# Patient Record
Sex: Male | Born: 1985 | Race: White | Hispanic: No | Marital: Married
Health system: Midwestern US, Community
[De-identification: ages and names within clinical notes are randomized; demographics above are authoritative.]

## PROBLEM LIST (undated history)

## (undated) DIAGNOSIS — M25559 Pain in unspecified hip: Secondary | ICD-10-CM

## (undated) DIAGNOSIS — A4902 Methicillin resistant Staphylococcus aureus infection, unspecified site: Secondary | ICD-10-CM

## (undated) DIAGNOSIS — W108XXA Fall (on) (from) other stairs and steps, initial encounter: Secondary | ICD-10-CM

## (undated) DIAGNOSIS — M25551 Pain in right hip: Secondary | ICD-10-CM

## (undated) DIAGNOSIS — H9209 Otalgia, unspecified ear: Secondary | ICD-10-CM

## (undated) DIAGNOSIS — M545 Low back pain, unspecified: Secondary | ICD-10-CM

## (undated) DIAGNOSIS — S72001A Fracture of unspecified part of neck of right femur, initial encounter for closed fracture: Secondary | ICD-10-CM

## (undated) DIAGNOSIS — G809 Cerebral palsy, unspecified: Secondary | ICD-10-CM

## (undated) DIAGNOSIS — G35 Multiple sclerosis: Secondary | ICD-10-CM

## (undated) DIAGNOSIS — G8220 Paraplegia, unspecified: Principal | ICD-10-CM

## (undated) DIAGNOSIS — Z765 Malingerer [conscious simulation]: Secondary | ICD-10-CM

## (undated) HISTORY — PX: BACK SURGERY: SHX140

## (undated) HISTORY — PX: LEG SURGERY: SHX1003

---
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
## 2014-03-26 ENCOUNTER — Encounter (HOSPITAL_COMMUNITY): Payer: Self-pay | Admitting: Emergency Medicine

## 2014-03-26 ENCOUNTER — Emergency Department (HOSPITAL_COMMUNITY): Payer: Medicare Other

## 2014-03-26 ENCOUNTER — Emergency Department (HOSPITAL_COMMUNITY)
Admission: EM | Admit: 2014-03-26 | Discharge: 2014-03-26 | Disposition: A | Discharge status: Home / Self Care | Payer: Medicare Other | Attending: Emergency Medicine | Admitting: Emergency Medicine

## 2014-03-26 DIAGNOSIS — Y998 Other external cause status: Secondary | ICD-10-CM | POA: Insufficient documentation

## 2014-03-26 DIAGNOSIS — Y9389 Activity, other specified: Secondary | ICD-10-CM | POA: Diagnosis not present

## 2014-03-26 DIAGNOSIS — Y9289 Other specified places as the place of occurrence of the external cause: Secondary | ICD-10-CM | POA: Diagnosis not present

## 2014-03-26 DIAGNOSIS — W19XXXA Unspecified fall, initial encounter: Secondary | ICD-10-CM

## 2014-03-26 DIAGNOSIS — S7001XA Contusion of right hip, initial encounter: Secondary | ICD-10-CM | POA: Insufficient documentation

## 2014-03-26 DIAGNOSIS — Z720 Tobacco use: Secondary | ICD-10-CM | POA: Insufficient documentation

## 2014-03-26 DIAGNOSIS — S79911A Unspecified injury of right hip, initial encounter: Secondary | ICD-10-CM | POA: Diagnosis present

## 2014-03-26 DIAGNOSIS — Z8669 Personal history of other diseases of the nervous system and sense organs: Secondary | ICD-10-CM | POA: Diagnosis not present

## 2014-03-26 DIAGNOSIS — W182XXA Fall in (into) shower or empty bathtub, initial encounter: Secondary | ICD-10-CM | POA: Insufficient documentation

## 2014-03-26 HISTORY — DX: Cerebral palsy, unspecified: G80.9

## 2014-03-26 HISTORY — DX: Multiple sclerosis: G35

## 2014-03-26 MED ORDER — ONDANSETRON HCL 4 MG PO TABS
4.0000 mg | ORAL_TABLET | Freq: Once | ORAL | Status: AC
  Administered 2014-03-26: 4 mg via ORAL
  Filled 2014-03-26: qty 1

## 2014-03-26 MED ORDER — HYDROCODONE-ACETAMINOPHEN 5-325 MG PO TABS: 1.0000 | ORAL_TABLET | ORAL | Status: DC | PRN

## 2014-03-26 MED ORDER — KETOROLAC TROMETHAMINE 10 MG PO TABS
10.0000 mg | ORAL_TABLET | Freq: Once | ORAL | Status: DC
  Filled 2014-03-26: qty 1

## 2014-03-26 MED ORDER — HYDROCODONE-ACETAMINOPHEN 5-325 MG PO TABS
2.0000 | ORAL_TABLET | Freq: Once | ORAL | Status: AC
  Administered 2014-03-26: 2 via ORAL
  Filled 2014-03-26: qty 2

## 2014-03-26 NOTE — ED Notes (Signed)
Pt seen by PCP yesterday, sent to ED. Pt fell in the shower 2 days ago injuring the R hip. Pt has hx of CP and MS.

## 2014-03-26 NOTE — ED Notes (Signed)
Called back for fast track, no answer.

## 2014-03-26 NOTE — ED Provider Notes (Signed)
CSN: [id number]     Arrival date & time 03/26/14  1137 History   First MD Initiated Contact with Patient 03/26/14 1351     Chief Complaint  Patient presents with  . Fall     (Consider location/radiation/quality/duration/timing/severity/associated sxs/prior Treatment) Patient is a 29 y.o. male presenting with fall. The history is provided by the patient.  Fall This is a new problem. The current episode started in the past 7 days. The problem occurs intermittently. The problem has been gradually worsening. Associated symptoms include arthralgias and weakness. Pertinent negatives include no abdominal pain, chest pain, coughing or neck pain. The symptoms are aggravated by standing. He has tried NSAIDs for the symptoms. The treatment provided moderate relief.    Past Medical History  Diagnosis Date  . MS (multiple sclerosis)   . CP (cerebral palsy)    Past Surgical History  Procedure Laterality Date  . Back surgery    . Leg surgery     Family History  Problem Relation Age of Onset  . Schizophrenia Mother   . Cirrhosis Father   . Heart failure Father   . Diabetes Father   . Diabetes Other    History  Substance Use Topics  . Smoking status: Current Every Day Smoker -- 0.50 packs/day    Types: Cigarettes  . Smokeless tobacco: Never Used  . Alcohol Use: No    Review of Systems  Constitutional: Negative for activity change.       All ROS Neg except as noted in HPI  HENT: Negative.   Eyes: Negative for photophobia and discharge.  Respiratory: Negative for cough, shortness of breath and wheezing.   Cardiovascular: Negative for chest pain and palpitations.  Gastrointestinal: Negative for abdominal pain and blood in stool.  Genitourinary: Negative for dysuria, frequency and hematuria.  Musculoskeletal: Positive for arthralgias. Negative for back pain and neck pain.  Skin: Negative.   Neurological: Positive for weakness. Negative for dizziness, seizures and speech difficulty.   Psychiatric/Behavioral: Negative for hallucinations and confusion.      Allergies  Codeine and Tramadol  Home Medications   Prior to Admission medications   Not on File   BP 109/69 mmHg  Pulse 79  Temp(Src) 98.4 F (36.9 C) (Oral)  Resp 16  Ht 5\' 5"  (1.651 m)  Wt 105 lb 9.6 oz (47.9 kg)  BMI 17.57 kg/m2  SpO2 97% Physical Exam  Constitutional: He is oriented to person, place, and time. He appears well-developed and well-nourished.  Non-toxic appearance.  HENT:  Head: Normocephalic.  Right Ear: Tympanic membrane and external ear normal.  Left Ear: Tympanic membrane and external ear normal.  Eyes: EOM and lids are normal. Pupils are equal, round, and reactive to light.  Neck: Normal range of motion. Neck supple. Carotid bruit is not present.  Cardiovascular: Normal rate, regular rhythm, normal heart sounds, intact distal pulses and normal pulses.   Pulmonary/Chest: Breath sounds normal. No respiratory distress.  Abdominal: Soft. Bowel sounds are normal. There is no tenderness. There is no guarding.  Musculoskeletal: Normal range of motion.  Pain to palpation of the right hip. No deformity noted. No palpable hematoma.  Lymphadenopathy:       Head (right side): No submandibular adenopathy present.       Head (left side): No submandibular adenopathy present.    He has no cervical adenopathy.  Neurological: He is alert and oriented to person, place, and time. He has normal strength. No cranial nerve deficit or sensory deficit.  Skin: Skin  is warm and dry.  Psychiatric: He has a normal mood and affect. His speech is normal.  Nursing note and vitals reviewed.   ED Course  Procedures (including critical care time) Labs Review Labs Reviewed - No data to display  Imaging Review Dg Hip Unilat With Pelvis 2-3 Views Right  03/26/2014   CLINICAL DATA:  29 year old male slipped and fell getting out of the bath tub 2 days ago. Right hip pain and decreased sensation in the right  lower extremity. Initial encounter. Current history of cerebral palsy.  EXAM: RIGHT HIP (WITH PELVIS) 2-3 VIEWS  COMPARISON:  None.  FINDINGS: Femoral heads normally located. Hip joint spaces appear preserved. Pelvis intact. sacral ala and SI joints within normal limits. Mildly dysplastic appearance of the proximal right femur which appears to remain intact. No acute fracture identified.  IMPRESSION: No acute fracture or dislocation identified about the right hip or pelvis.   Electronically Signed   By: Odessa Fleming M.D.   On: 03/26/2014 12:59     EKG Interpretation None      MDM  No acute fracture or dislocation noted. Vital signs non-acute.  Pt encouraged to use ibuprofen and Ice pack. Rx for norco given to the patient.   Final diagnoses:  Fall    *I have reviewed nursing notes, vital signs, and all appropriate lab and imaging results for this patient.[address], PA-C 03/26/14 1404  Benjiman Core, MD 03/26/14 [phone]

## 2014-03-26 NOTE — Discharge Instructions (Signed)
Your xray is negative for fracture or dislocation. Please apply ice today, and use ibuprofen for discomfort. Use Norco for more severe pain. Please see your primary MD for recheck and additional management. Contusion A contusion is a deep bruise. Contusions happen when an injury causes bleeding under the skin. Signs of bruising include pain, puffiness (swelling), and discolored skin. The contusion may turn blue, purple, or yellow. HOME CARE   Put ice on the injured area.  Put ice in a plastic bag.  Place a towel between your skin and the bag.  Leave the ice on for 15-20 minutes, 03-04 times a day.  Only take medicine as told by your doctor.  Rest the injured area.  If possible, raise (elevate) the injured area to lessen puffiness. GET HELP RIGHT AWAY IF:   You have more bruising or puffiness.  You have pain that is getting worse.  Your puffiness or pain is not helped by medicine. MAKE SURE YOU:   Understand these instructions.  Will watch your condition.  Will get help right away if you are not doing well or get worse. Document Released: 06/23/2007 Document Revised: 03/29/2011 Document Reviewed: 11/09/2010 [facility] Patient Information 2015 [place], [place]. This information is not intended to replace advice given to you by your health care provider. Make sure you discuss any questions you have with your health care provider.

## 2014-04-03 ENCOUNTER — Emergency Department (HOSPITAL_COMMUNITY)
Admission: EM | Admit: 2014-04-03 | Discharge: 2014-04-03 | Disposition: A | Discharge status: Home / Self Care | Payer: Medicare Other | Attending: Emergency Medicine | Admitting: Emergency Medicine

## 2014-04-03 ENCOUNTER — Encounter (HOSPITAL_COMMUNITY): Payer: Self-pay | Admitting: *Deleted

## 2014-04-03 ENCOUNTER — Emergency Department (HOSPITAL_COMMUNITY): Payer: Medicare Other

## 2014-04-03 DIAGNOSIS — Y998 Other external cause status: Secondary | ICD-10-CM | POA: Insufficient documentation

## 2014-04-03 DIAGNOSIS — Z880 Allergy status to penicillin: Secondary | ICD-10-CM | POA: Insufficient documentation

## 2014-04-03 DIAGNOSIS — Y92129 Unspecified place in nursing home as the place of occurrence of the external cause: Secondary | ICD-10-CM | POA: Diagnosis not present

## 2014-04-03 DIAGNOSIS — Y9389 Activity, other specified: Secondary | ICD-10-CM | POA: Diagnosis not present

## 2014-04-03 DIAGNOSIS — W06XXXA Fall from bed, initial encounter: Secondary | ICD-10-CM | POA: Diagnosis not present

## 2014-04-03 DIAGNOSIS — Z8669 Personal history of other diseases of the nervous system and sense organs: Secondary | ICD-10-CM | POA: Diagnosis not present

## 2014-04-03 DIAGNOSIS — Z720 Tobacco use: Secondary | ICD-10-CM | POA: Diagnosis not present

## 2014-04-03 DIAGNOSIS — S7001XA Contusion of right hip, initial encounter: Secondary | ICD-10-CM | POA: Insufficient documentation

## 2014-04-03 DIAGNOSIS — W19XXXA Unspecified fall, initial encounter: Secondary | ICD-10-CM

## 2014-04-03 DIAGNOSIS — S79911A Unspecified injury of right hip, initial encounter: Secondary | ICD-10-CM | POA: Diagnosis present

## 2014-04-03 MED ORDER — HYDROCODONE-ACETAMINOPHEN 5-325 MG PO TABS
1.0000 | ORAL_TABLET | Freq: Once | ORAL | Status: AC
  Administered 2014-04-03: 1 via ORAL
  Filled 2014-04-03: qty 1

## 2014-04-03 MED ORDER — ACETAMINOPHEN 500 MG PO TABS
1000.0000 mg | ORAL_TABLET | Freq: Once | ORAL | Status: AC
  Administered 2014-04-03: 1000 mg via ORAL
  Filled 2014-04-03: qty 2

## 2014-04-03 NOTE — ED Provider Notes (Signed)
CSN: [id number]     Arrival date & time 04/03/14  1238 History  This chart was scribed for Donnetta Hutching, MD by Luisa Dago, Medical Scribe. This patient was seen in room APA07/APA07 and the patient's care was started at 1:23 PM.    Chief Complaint  Patient presents with  . Fall   The history is provided by the patient. No language interpreter was used.   HPI Comments: Elijah Palmer is a 29 y.o. male with PMhx of Multiple sclerosis and cerebral palsy presents to the Emergency Department from [facility] complaining of a fall that occurred this AM. Pt states that he rolled off the bed unto the floor. He is now complaining of right hip.  Pt states that the pain is exacerbated by bearing weight. Pt denies any fever, neck pain, sore throat, visual disturbance, CP, cough, SOB, abdominal pain, nausea, emesis, diarrhea, urinary symptoms, back pain, HA, weakness, numbness and rash as associated symptoms.     Reported PCP: Peter Congo, MD   Past Medical History  Diagnosis Date  . MS (multiple sclerosis)   . CP (cerebral palsy)    Past Surgical History  Procedure Laterality Date  . Back surgery    . Leg surgery     Family History  Problem Relation Age of Onset  . Schizophrenia Mother   . Cirrhosis Father   . Heart failure Father   . Diabetes Father   . Diabetes Other    History  Substance Use Topics  . Smoking status: Current Every Day Smoker -- 0.50 packs/day    Types: Cigarettes  . Smokeless tobacco: Never Used  . Alcohol Use: No    Review of Systems  Musculoskeletal: Positive for arthralgias.  A complete 10 system review of systems was obtained and all systems are negative except as noted in the HPI and PMH.   Allergies  Codeine; Penicillins; Peanuts; Toradol; and Tramadol  Home Medications   Prior to Admission medications   Medication Sig Start Date End Date Taking? Authorizing Provider  HYDROcodone-acetaminophen (NORCO/VICODIN) 5-325 MG per tablet Take 1-2  tablets by mouth every 4 (four) hours as needed. 03/26/14  Yes Ivery Quale, PA-C   BP 98/65 mmHg  Pulse 78  Temp(Src) 98.5 F (36.9 C) (Oral)  Resp 18  Ht 5\' 2"  (1.575 m)  Wt 102 lb (46.267 kg)  BMI 18.65 kg/m2  SpO2 98%  Physical Exam  Constitutional: He is oriented to person, place, and time. He appears well-developed and well-nourished.  HENT:  Head: Normocephalic and atraumatic.  Eyes: Conjunctivae and EOM are normal. Pupils are equal, round, and reactive to light.  Neck: Normal range of motion. Neck supple.  Cardiovascular: Normal rate and regular rhythm.   Pulmonary/Chest: Effort normal and breath sounds normal.  Abdominal: Soft. Bowel sounds are normal.  Musculoskeletal: Normal range of motion. He exhibits tenderness.  Tenderness to right lateral hip and right lateral proximal femur.   Neurological: He is alert and oriented to person, place, and time.  Skin: Skin is warm and dry.  Psychiatric: He has a normal mood and affect. His behavior is normal.  Nursing note and vitals reviewed.   ED Course  Procedures (including critical care time)  DIAGNOSTIC STUDIES: Oxygen Saturation is 98% on RA, normal by my interpretation.    COORDINATION OF CARE: 1:28 PM-Will order right hip x-ray. Pt advised of plan for treatment and pt agrees.  Labs Review Labs Reviewed - No data to display  Imaging Review Dg Femur,  Min 2 Views Right  04/03/2014   CLINICAL DATA:  Fall out of bed today. Right hip pain to the knee. Personal history of multiple sclerosis and cerebral palsy.  EXAM: RIGHT FEMUR 2 VIEWS  COMPARISON:  None.  FINDINGS: The right hip and knee are located. No acute bone or soft tissue abnormality is present.  IMPRESSION: Negative right femur radiographs.   Electronically Signed   By: Marin Roberts M.D.   On: 04/03/2014 14:52     EKG Interpretation None      MDM   Final diagnoses:  Fall  Contusion of right hip, initial encounter   Plain films of right femur  show no fracture. No other obvious injuries.  I personally performed the services described in this documentation, which was scribed in my presence. The recorded information has been reviewed and is accurate.   Donnetta Hutching, MD 04/03/14 [phone]

## 2014-04-03 NOTE — ED Notes (Signed)
Pt here from [facility], Says he "rolled off my bed" pain rt hip and foot.    No LOC. Alert.

## 2014-04-03 NOTE — ED Notes (Signed)
Patient would like something for pain. RN made aware. 

## 2014-04-03 NOTE — Discharge Instructions (Signed)
X-ray shows no fracture. Ice. Over-the-counter pain medication

## 2014-04-09 ENCOUNTER — Encounter (HOSPITAL_COMMUNITY): Payer: Self-pay | Admitting: *Deleted

## 2014-04-09 ENCOUNTER — Emergency Department (HOSPITAL_COMMUNITY): Payer: Medicare Other

## 2014-04-09 ENCOUNTER — Emergency Department (HOSPITAL_COMMUNITY)
Admission: EM | Admit: 2014-04-09 | Discharge: 2014-04-09 | Disposition: A | Discharge status: Home / Self Care | Payer: Medicare Other | Attending: Emergency Medicine | Admitting: Emergency Medicine

## 2014-04-09 DIAGNOSIS — S79911A Unspecified injury of right hip, initial encounter: Secondary | ICD-10-CM | POA: Insufficient documentation

## 2014-04-09 DIAGNOSIS — S299XXA Unspecified injury of thorax, initial encounter: Secondary | ICD-10-CM | POA: Insufficient documentation

## 2014-04-09 DIAGNOSIS — Y9289 Other specified places as the place of occurrence of the external cause: Secondary | ICD-10-CM | POA: Insufficient documentation

## 2014-04-09 DIAGNOSIS — Z720 Tobacco use: Secondary | ICD-10-CM | POA: Insufficient documentation

## 2014-04-09 DIAGNOSIS — R0789 Other chest pain: Secondary | ICD-10-CM

## 2014-04-09 DIAGNOSIS — M25559 Pain in unspecified hip: Secondary | ICD-10-CM

## 2014-04-09 DIAGNOSIS — Y9389 Activity, other specified: Secondary | ICD-10-CM | POA: Insufficient documentation

## 2014-04-09 DIAGNOSIS — Z79899 Other long term (current) drug therapy: Secondary | ICD-10-CM | POA: Diagnosis not present

## 2014-04-09 DIAGNOSIS — W1839XA Other fall on same level, initial encounter: Secondary | ICD-10-CM | POA: Diagnosis not present

## 2014-04-09 DIAGNOSIS — Y998 Other external cause status: Secondary | ICD-10-CM | POA: Diagnosis not present

## 2014-04-09 DIAGNOSIS — Z7289 Other problems related to lifestyle: Secondary | ICD-10-CM | POA: Insufficient documentation

## 2014-04-09 DIAGNOSIS — Z880 Allergy status to penicillin: Secondary | ICD-10-CM | POA: Diagnosis not present

## 2014-04-09 DIAGNOSIS — Z8669 Personal history of other diseases of the nervous system and sense organs: Secondary | ICD-10-CM | POA: Diagnosis not present

## 2014-04-09 DIAGNOSIS — Z765 Malingerer [conscious simulation]: Secondary | ICD-10-CM

## 2014-04-09 MED ORDER — OXYCODONE-ACETAMINOPHEN 5-325 MG PO TABS
2.0000 | ORAL_TABLET | Freq: Once | ORAL | Status: AC
  Administered 2014-04-09: 2 via ORAL
  Filled 2014-04-09: qty 2

## 2014-04-09 MED ORDER — OXYCODONE-ACETAMINOPHEN 5-325 MG PO TABS: 1.0000 | ORAL_TABLET | ORAL | Status: DC | PRN

## 2014-04-09 NOTE — ED Notes (Signed)
Pt from [facility] in [place],  [place] in shower.  Pain rt hip and leg.  ,  Neck "sore ".    No LOC  Alert,

## 2014-04-09 NOTE — Discharge Instructions (Signed)
Chest Wall Pain Chest wall pain is pain in or around the bones and muscles of your chest. It may take up to 6 weeks to get better. It may take longer if you must stay physically active in your work and activities.  CAUSES  Chest wall pain may happen on its own. However, it may be caused by:  A viral illness like the flu.  Injury.  Coughing.  Exercise.  Arthritis.  Fibromyalgia.  Shingles. HOME CARE INSTRUCTIONS   Avoid overtiring physical activity. Try not to strain or perform activities that cause pain. This includes any activities using your chest or your abdominal and side muscles, especially if heavy weights are used.  Put ice on the sore area.  Put ice in a plastic bag.  Place a towel between your skin and the bag.  Leave the ice on for 15-20 minutes per hour while awake for the first 2 days.  Only take over-the-counter or prescription medicines for pain, discomfort, or fever as directed by your caregiver. SEEK IMMEDIATE MEDICAL CARE IF:   Your pain increases, or you are very uncomfortable.  You have a fever.  Your chest pain becomes worse.  You have new, unexplained symptoms.  You have nausea or vomiting.  You feel sweaty or lightheaded.  You have a cough with phlegm (sputum), or you cough up blood. MAKE SURE YOU:   Understand these instructions.  Will watch your condition.  Will get help right away if you are not doing well or get worse. Document Released: 01/04/2005 Document Revised: 03/29/2011 Document Reviewed: 08/31/2010 [facility] Patient Information 2015 [place], [place]. This information is not intended to replace advice given to you by your health care provider. Make sure you discuss any questions you have with your health care provider.  Hip Pain Your hip is the joint between your upper legs and your lower pelvis. The bones, cartilage, tendons, and muscles of your hip joint perform a lot of work each day supporting your body weight and allowing  you to move around. Hip pain can range from a minor ache to severe pain in one or both of your hips. Pain may be felt on the inside of the hip joint near the groin, or the outside near the buttocks and upper thigh. You may have swelling or stiffness as well.  HOME CARE INSTRUCTIONS   Take medicines only as directed by your health care provider.  Apply ice to the injured area:  Put ice in a plastic bag.  Place a towel between your skin and the bag.  Leave the ice on for 15-20 minutes at a time, 3-4 times a day.  Keep your leg raised (elevated) when possible to lessen swelling.  Avoid activities that cause pain.  Follow specific exercises as directed by your health care provider.  Sleep with a pillow between your legs on your most comfortable side.  Record how often you have hip pain, the location of the pain, and what it feels like. SEEK MEDICAL CARE IF:   You are unable to put weight on your leg.  Your hip is red or swollen or very tender to touch.  Your pain or swelling continues or worsens after 1 week.  You have increasing difficulty walking.  You have a fever. SEEK IMMEDIATE MEDICAL CARE IF:   You have fallen.  You have a sudden increase in pain and swelling in your hip. MAKE SURE YOU:   Understand these instructions.  Will watch your condition.  Will get help  away if you are not doing well or get worse. °Document Released: 06/24/2009 Document Revised: 05/21/2013 Document Reviewed: 08/31/2012 °[employer]® Patient Information ©2015 [employer]. This information is not intended to replace advice given to you by your health care provider. Make sure you discuss any questions you have with your health care provider. ° °

## 2014-04-09 NOTE — ED Notes (Signed)
Pt alert & oriented x4, stable gait. Patient & caregiver given discharge instructions, paperwork & prescription(s). Patient & caregiver instructed to stop at the registration desk to finish any additional paperwork. Patient & caregiver verbalized understanding. Pt left department w/ no further questions.

## 2014-04-09 NOTE — ED Provider Notes (Signed)
TIME SEEN: This chart was scribed for Elijah Maw Daegon Deiss, DO by Murriel Hopper, ED Scribe. This patient was seen in room APA06/APA06 and the patient's care was started at 9:45 PM.   CHIEF COMPLAINT:  Chief Complaint  Patient presents with  . Fall     HPI: HPI Comments: Elijah Palmer is a 29 y.o. male with MS and Cerebral Palsy who presents to the Emergency Department from [facility] complaining of a fall that occurred an hour and a half PTA. Pt states that he was standing in the shower and fell while trying to get out. Pt states that he fell on his right side and complains of right hip and leg pain with associated neck stiffness. Pt denies hitting his head and denies any LOC. Not on anticoagulation. Of note this is his third visit in the past 3 weeks for the same. Denies any new numbness, tingling or focal weakness. Is chronically in a wheelchair but states he is able to stand in the shower.    ROS: See HPI Constitutional: no fever  Eyes: no drainage  ENT: no runny nose   Cardiovascular:  no chest pain  Resp: no SOB  GI: no vomiting GU: no dysuria Integumentary: no rash  Allergy: no hives  Musculoskeletal: no leg swelling  Neurological: no slurred speech ROS otherwise negative  PAST MEDICAL HISTORY/PAST SURGICAL HISTORY:  Past Medical History  Diagnosis Date  . MS (multiple sclerosis)   . CP (cerebral palsy)     MEDICATIONS:  Prior to Admission medications   Medication Sig Start Date End Date Taking? Authorizing Provider  alprazolam Prudy Feeler) 2 MG tablet Take 2 mg by mouth QID.   Yes Historical Provider, MD  clonazePAM (KLONOPIN) 2 MG tablet Take 2 mg by mouth QID.   Yes Historical Provider, MD  FLUoxetine (PROZAC) 40 MG capsule Take 40 mg by mouth 2 times daily at 12 noon and 4 pm.   Yes Historical Provider, MD  HYDROcodone-acetaminophen (NORCO/VICODIN) 5-325 MG per tablet Take 1-2 tablets by mouth every 4 (four) hours as needed. 03/26/14   Ivery Quale, PA-C     ALLERGIES:  Allergies  Allergen Reactions  . Codeine Anaphylaxis  . Penicillins Anaphylaxis  . Sulfa Antibiotics Anaphylaxis    Sulfa Drugs  . Latex Hives  . Peanuts [Peanut Oil]   . Toradol [Ketorolac Tromethamine]   . Other Rash    Peanuts and Pineapple  . Tramadol Rash    SOCIAL HISTORY:  History  Substance Use Topics  . Smoking status: Current Every Day Smoker -- 0.50 packs/day    Types: Cigarettes  . Smokeless tobacco: Never Used  . Alcohol Use: No    FAMILY HISTORY: Family History  Problem Relation Age of Onset  . Schizophrenia Mother   . Cirrhosis Father   . Heart failure Father   . Diabetes Father   . Diabetes Other     EXAM: BP 97/59 mmHg  Pulse 65  Temp(Src) 98.1 F (36.7 C) (Oral)  Resp 20  Ht  (1.651 m)  Wt 102 lb (46.267 kg)  BMI 16.97 kg/m2  SpO2 100% CONSTITUTIONAL: Alert and oriented and responds appropriately to questions. Well-appearing; well-nourished, thin, in no distress HEAD: Normocephalic, atraumatic EYES: Conjunctivae clear, PERRL ENT: normal nose; no rhinorrhea; moist mucous membranes; pharynx without lesions noted, no dental injury or septal hematoma NECK: Supple, no meningismus, no LAD; no midline spinal tenderness or step-off or deformity  CARD: RRR; S1 and S2 appreciated; no murmurs, no clicks,  no rubs, no gallops RESP: Normal chest excursion without splinting or tachypnea; breath sounds clear and equal bilaterally; no wheezes, no rhonchi, no rales, tender to palpation over the right chest wall without crepitus or ecchymosis or deformity ABD/GI: Normal bowel sounds; non-distended; soft, non-tender, no rebound, no guarding BACK:  The back appears normal and is non-tender to palpation, there is no CVA tenderness, no midline spinal tenderness or step-off or deformity EXT: Normal ROM in all joints; no edema; normal capillary refill; no cyanosis; patient is tender to palpation over the right hip and lateral femur without  deformity or effusion, compartments soft, 2+ DP pulses bilaterally   SKIN: Normal color for age and race; warm NEURO: Moves all extremities equally, reports decreased sensation in the right lower extremity which is chronic, normal sensation in the left lower extremity and upper extremities, cranial nerves II through XII intact PSYCH: The patient's mood and manner are appropriate. Grooming and personal hygiene are appropriate.  MEDICAL DECISION MAKING: Patient here with reported mechanical fall. No sign of injury on exam. No new neurologic deficits. X-ray show no acute injury. Patient has been here 3 times in the past 3 weeks for similar symptoms. Discussed with patient that this raises red flags for drug-seeking behavior and I recommend he follow-up with his primary care physician for further management. He has requested multiple rounds of pain medication in the ED. He has received Percocet and appeared very drowsy afterwards. Discussed with him that I do not feel is safe to give him any further narcotics but he has requested more pain medicine multiple times but appears comfortable on exam, texting on his phone. States he is only able to take Percocet and Dilaudid. Have ordered him a new wheelchair as well as a shower chair. He currently lives in a nursing facility. Discussed return precautions. He verbalizes understanding and is comfortable with plan.     I personally performed the services described in this documentation, which was scribed in my presence. The recorded information has been reviewed and is accurate.   Elijah Maw Samariya Rockhold, DO 04/10/14 0004

## 2014-04-13 ENCOUNTER — Emergency Department: Payer: Self-pay | Admitting: Emergency Medicine

## 2014-04-17 ENCOUNTER — Emergency Department: Admit: 2014-04-17 | Disposition: A | Discharge status: Home / Self Care | Payer: Self-pay | Admitting: Internal Medicine

## 2014-04-17 LAB — COMPREHENSIVE METABOLIC PANEL
ALK PHOS: 75 U/L
ANION GAP: 4 — AB (ref 7–16)
Albumin: 4.2 g/dL
BUN: 17 mg/dL
Bilirubin,Total: 0.3 mg/dL
CALCIUM: 9.1 mg/dL
CO2: 30 mmol/L
Chloride: 103 mmol/L
Creatinine: 0.72 mg/dL
EGFR (Non-African Amer.): >60
Glucose: 100 mg/dL — ABNORMAL HIGH
Potassium: 4.1 mmol/L
SGOT(AST): 19 U/L
SGPT (ALT): 18 U/L
Sodium: 137 mmol/L
Total Protein: 6.9 g/dL

## 2014-04-17 LAB — URINALYSIS, COMPLETE
BACTERIA: NONE SEEN
BILIRUBIN, UR: NEGATIVE
Blood: NEGATIVE
GLUCOSE, UR: NEGATIVE mg/dL (ref 0–75)
Ketone: NEGATIVE
LEUKOCYTE ESTERASE: NEGATIVE
Nitrite: NEGATIVE
Ph: 6 (ref 4.5–8.0)
Protein: NEGATIVE
RBC,UR: <1 /HPF (ref 0–5)
Specific Gravity: 1.019 (ref 1.003–1.030)
Squamous Epithelial: NONE SEEN
WBC UR: <1 /HPF (ref 0–5)

## 2014-04-17 LAB — CBC
HCT: 49.7 % (ref 40.0–52.0)
HGB: 16.8 g/dL (ref 13.0–18.0)
MCH: 30.6 pg (ref 26.0–34.0)
MCHC: 33.9 g/dL (ref 32.0–36.0)
MCV: 90 fL (ref 80–100)
Platelet: 163 10*3/uL (ref 150–440)
RBC: 5.51 10*6/uL (ref 4.40–5.90)
RDW: 14.4 % (ref 11.5–14.5)
WBC: 6.9 10*3/uL (ref 3.8–10.6)

## 2014-04-17 LAB — PROTIME-INR
INR: 1.1
PROTHROMBIN TIME: 14 s

## 2014-04-23 ENCOUNTER — Emergency Department
Admit: 2014-04-23 | Disposition: A | Discharge status: Home / Self Care | Payer: Self-pay | Admitting: Emergency Medicine

## 2014-04-24 ENCOUNTER — Emergency Department (HOSPITAL_COMMUNITY): Payer: Medicare Other

## 2014-04-24 ENCOUNTER — Encounter (HOSPITAL_COMMUNITY): Payer: Self-pay | Admitting: Cardiology

## 2014-04-24 ENCOUNTER — Emergency Department (HOSPITAL_COMMUNITY)
Admission: EM | Admit: 2014-04-24 | Discharge: 2014-04-24 | Disposition: A | Discharge status: Home / Self Care | Payer: Medicare Other | Attending: Emergency Medicine | Admitting: Emergency Medicine

## 2014-04-24 DIAGNOSIS — Y9389 Activity, other specified: Secondary | ICD-10-CM | POA: Diagnosis not present

## 2014-04-24 DIAGNOSIS — W050XXA Fall from non-moving wheelchair, initial encounter: Secondary | ICD-10-CM | POA: Diagnosis not present

## 2014-04-24 DIAGNOSIS — S79911A Unspecified injury of right hip, initial encounter: Secondary | ICD-10-CM | POA: Diagnosis present

## 2014-04-24 DIAGNOSIS — Y998 Other external cause status: Secondary | ICD-10-CM | POA: Diagnosis not present

## 2014-04-24 DIAGNOSIS — W19XXXA Unspecified fall, initial encounter: Secondary | ICD-10-CM

## 2014-04-24 DIAGNOSIS — Z79899 Other long term (current) drug therapy: Secondary | ICD-10-CM | POA: Insufficient documentation

## 2014-04-24 DIAGNOSIS — Y9289 Other specified places as the place of occurrence of the external cause: Secondary | ICD-10-CM | POA: Insufficient documentation

## 2014-04-24 DIAGNOSIS — Z8669 Personal history of other diseases of the nervous system and sense organs: Secondary | ICD-10-CM | POA: Insufficient documentation

## 2014-04-24 DIAGNOSIS — Z880 Allergy status to penicillin: Secondary | ICD-10-CM | POA: Diagnosis not present

## 2014-04-24 DIAGNOSIS — M25551 Pain in right hip: Secondary | ICD-10-CM

## 2014-04-24 DIAGNOSIS — Z765 Malingerer [conscious simulation]: Secondary | ICD-10-CM | POA: Insufficient documentation

## 2014-04-24 DIAGNOSIS — Z91040 Latex allergy status: Secondary | ICD-10-CM | POA: Diagnosis not present

## 2014-04-24 DIAGNOSIS — Z87891 Personal history of nicotine dependence: Secondary | ICD-10-CM | POA: Diagnosis not present

## 2014-04-24 MED ORDER — FENTANYL CITRATE 0.05 MG/ML IJ SOLN
INTRAMUSCULAR | Status: AC
  Filled 2014-04-24: qty 2

## 2014-04-24 MED ORDER — FENTANYL CITRATE 0.05 MG/ML IJ SOLN
50.0000 ug | Freq: Once | INTRAMUSCULAR | Status: DC
  Administered 2014-04-24: 50 ug via INTRAVENOUS

## 2014-04-24 MED ORDER — ACETAMINOPHEN 500 MG PO TABS
1000.0000 mg | ORAL_TABLET | Freq: Once | ORAL | Status: DC
  Filled 2014-04-24: qty 2

## 2014-04-24 NOTE — ED Notes (Signed)
Larey Seat out of wheelchair this morning.  C/o right hip and lower back pain.  Recently had same hip fractured.

## 2014-04-24 NOTE — ED Provider Notes (Addendum)
TIME SEEN: 3:25 PM  CHIEF COMPLAINT: Fall  HPI: Pt is a 29 y.o. male with history of multiple sclerosis, cerebral palsy who presents from a group home after he fell out of his wheelchair today. Reports he fell onto his right side and is complaining of right hip pain and coccyx pain. No head injury or loss of consciousness. This is his fourth visit in the past 4 weeks for the same. Patient reports he is chronically in a wheelchair. Has chronic numbness and weakness in his bilateral lower extremities, worse on the right side. No new neurologic deficit.  ROS: See HPI Constitutional: no fever  Eyes: no drainage  ENT: no runny nose   Cardiovascular:  no chest pain  Resp: no SOB  GI: no vomiting GU: no dysuria Integumentary: no rash  Allergy: no hives  Musculoskeletal: no leg swelling  Neurological: no slurred speech ROS otherwise negative  PAST MEDICAL HISTORY/PAST SURGICAL HISTORY:  Past Medical History  Diagnosis Date  . MS (multiple sclerosis)   . CP (cerebral palsy)     MEDICATIONS:  Prior to Admission medications   Medication Sig Start Date End Date Taking? Authorizing Provider  alprazolam Prudy Feeler) 2 MG tablet Take 2 mg by mouth 4 (four) times daily.    Yes Historical Provider, MD  clonazePAM (KLONOPIN) 2 MG tablet Take 2 mg by mouth 4 (four) times daily.    Yes Historical Provider, MD  FLUoxetine (PROZAC) 40 MG capsule Take 40 mg by mouth 2 (two) times daily.    Yes Historical Provider, MD  oxyCODONE-acetaminophen (PERCOCET/ROXICET) 5-325 MG per tablet Take 1 tablet by mouth every 4 (four) hours as needed. 04/09/14  Yes Kristen N Ward, DO  HYDROcodone-acetaminophen (NORCO/VICODIN) 5-325 MG per tablet Take 1-2 tablets by mouth every 4 (four) hours as needed. Patient not taking: Reported on 04/09/2014 03/26/14   Ivery Quale, PA-C    ALLERGIES:  Allergies  Allergen Reactions  . Codeine Anaphylaxis  . Penicillins Anaphylaxis  . Sulfa Antibiotics Anaphylaxis    Sulfa Drugs  .  Latex Hives  . Peanuts [Peanut Oil]   . Toradol [Ketorolac Tromethamine]   . Other Rash    Peanuts and Pineapple  . Tramadol Rash    SOCIAL HISTORY:  History  Substance Use Topics  . Smoking status: Former Smoker -- 0.50 packs/day  . Smokeless tobacco: Never Used  . Alcohol Use: No    FAMILY HISTORY: Family History  Problem Relation Age of Onset  . Schizophrenia Mother   . Cirrhosis Father   . Heart failure Father   . Diabetes Father   . Diabetes Other     EXAM: BP 107/71 mmHg  Pulse 60  Temp(Src) 97.9 F (36.6 C) (Oral)  Resp 16  Ht  (1.651 m)  Wt 117 lb (53.071 kg)  BMI 19.47 kg/m2  SpO2 92% CONSTITUTIONAL: Alert and oriented and responds appropriately to questions. Well-appearing; well-nourished, in no distress HEAD: Normocephalic, atraumatic EYES: Conjunctivae clear, PERRL ENT: normal nose; no rhinorrhea; moist mucous membranes; pharynx without lesions noted NECK: Supple, no meningismus, no LAD; no midline spinal tenderness or step-off or deformity CARD: RRR; S1 and S2 appreciated; no murmurs, no clicks, no rubs, no gallops RESP: Normal chest excursion without splinting or tachypnea; breath sounds clear and equal bilaterally; no wheezes, no rhonchi, no rales, chest wall nontender to palpation ABD/GI: Normal bowel sounds; non-distended; soft, non-tender, no rebound, no guarding BACK:  The back appears normal and is non-tender to palpation, there is no CVA  tenderness, no midline spinal tenderness or step-off or deformity EXT: Tender to palpation over the right hip without joint effusion or bony deformity, no erythema or warmth, compartments are soft, no leg length discrepancy, 2+ DP pulses bilaterally, Normal ROM in all joints; otherwise extremities are non-tender to palpation; no edema; normal capillary refill; no cyanosis    SKIN: Normal color for age and race; warm NEURO: Patient has chronic weakness in bilateral lower extremity is worse in the right side and  some sensory deficits in his lower extremities that is also baseline, cranial nerves II through XII intact, normal sensation and movement in his upper extremity is bilaterally PSYCH: The patient's mood and manner are appropriate. Grooming and personal hygiene are appropriate.  MEDICAL DECISION MAKING: Patient here with fall of his wheelchair. I suspect there is some component of drug-seeking behavior given this is the fourth visit patient has had to the emergency department in the past 4 weeks for similar complaints. He was given signal prior to me seeing him. Have discussed with patient that given his multiple visits I do not comfortable providing him any further narcotics in the emergency department. On x-ray his hip shows no acute abnormality. X-ray of his coccyx shows a questionable nondisplaced coccygeal fracture. Although patient does possibly have a very small nondisplaced fracture on x-ray I feel that giving him further narcotics would be more detrimental to the patient. Have advised him that he can use Tylenol for pain and that if he has continued pain that is not controlled with Tylenol that he needs to follow-up with his primary care physician. Discussed return precautions. We'll discharge home.       Layla Maw Ward, DO 04/24/14 1627    EKG accidentally ordered on patient. He is not complaining of chest pain or shortness of breath. No preceding symptoms that led to his fall. States he slid out of his wheelchair.     EKG Interpretation  Date/Time:  Wednesday April 24 2014 16:27:01 EDT Ventricular Rate:  65 PR Interval:  130 QRS Duration: 92 QT Interval:  401 QTC Calculation: 417 R Axis:   73 Text Interpretation:  Sinus rhythm RSR' in V1 or V2, right VCD or RVH No old tracing to compare Confirmed by WARD,  DO, KRISTEN [phone]) on 04/24/2014 4:30:54 PM        Layla Maw Ward, DO 04/24/14 1631    Patient states he has a primary care provider who once him to be referred to  Dr. Hilda Lias. Discussed with patient I will give him the contact information for Dr. Hilda Lias and Dr. Romeo Apple and that he can call to schedule an outpatient appointment.  Layla Maw Ward, DO 04/24/14 1634   Patient reported to me that he is mostly wheelchair-bound and cannot stand because of his pain. When I walk bye the room he is standing in the room and walking without any difficulty.  Layla Maw Ward, DO 04/24/14 1705

## 2014-04-24 NOTE — Discharge Instructions (Signed)
You may take Tylenol 1000 mg every 6 hours as needed for pain.   Arthritis, Nonspecific Arthritis is inflammation of a joint. This usually means pain, redness, warmth or swelling are present. One or more joints may be involved. There are a number of types of arthritis. Your caregiver may not be able to tell what type of arthritis you have right away. CAUSES  The most common cause of arthritis is the wear and tear on the joint (osteoarthritis). This causes damage to the cartilage, which can break down over time. The knees, hips, back and neck are most often affected by this type of arthritis. Other types of arthritis and common causes of joint pain include:  Sprains and other injuries near the joint. Sometimes minor sprains and injuries cause pain and swelling that develop hours later.  Rheumatoid arthritis. This affects hands, feet and knees. It usually affects both sides of your body at the same time. It is often associated with chronic ailments, fever, weight loss and general weakness.  Crystal arthritis. Gout and pseudo gout can cause occasional acute severe pain, redness and swelling in the foot, ankle, or knee.  Infectious arthritis. Bacteria can get into a joint through a break in overlying skin. This can cause infection of the joint. Bacteria and viruses can also spread through the blood and affect your joints.  Drug, infectious and allergy reactions. Sometimes joints can become mildly painful and slightly swollen with these types of illnesses. SYMPTOMS   Pain is the main symptom.  Your joint or joints can also be red, swollen and warm or hot to the touch.  You may have a fever with certain types of arthritis, or even feel overall ill.  The joint with arthritis will hurt with movement. Stiffness is present with some types of arthritis. DIAGNOSIS  Your caregiver will suspect arthritis based on your description of your symptoms and on your exam. Testing may be needed to find the type  of arthritis:  Blood and sometimes urine tests.  X-ray tests and sometimes CT or MRI scans.  Removal of fluid from the joint (arthrocentesis) is done to check for bacteria, crystals or other causes. Your caregiver (or a specialist) will numb the area over the joint with a local anesthetic, and use a needle to remove joint fluid for examination. This procedure is only minimally uncomfortable.  Even with these tests, your caregiver may not be able to tell what kind of arthritis you have. Consultation with a specialist (rheumatologist) may be helpful. TREATMENT  Your caregiver will discuss with you treatment specific to your type of arthritis. If the specific type cannot be determined, then the following general recommendations may apply. Treatment of severe joint pain includes:  Rest.  Elevation.  Anti-inflammatory medication (for example, ibuprofen) may be prescribed. Avoiding activities that cause increased pain.  Only take over-the-counter or prescription medicines for pain and discomfort as recommended by your caregiver.  Cold packs over an inflamed joint may be used for 10 to 15 minutes every hour. Hot packs sometimes feel better, but do not use overnight. Do not use hot packs if you are diabetic without your caregiver's permission.  A cortisone shot into arthritic joints may help reduce pain and swelling.  Any acute arthritis that gets worse over the next 1 to 2 days needs to be looked at to be sure there is no joint infection. Long-term arthritis treatment involves modifying activities and lifestyle to reduce joint stress jarring. This can include weight loss. Also, exercise  is needed to nourish the joint cartilage and remove waste. This helps keep the muscles around the joint strong. HOME CARE INSTRUCTIONS   Do not take aspirin to relieve pain if gout is suspected. This elevates uric acid levels.  Only take over-the-counter or prescription medicines for pain, discomfort or fever  as directed by your caregiver.  Rest the joint as much as possible.  If your joint is swollen, keep it elevated.  Use crutches if the painful joint is in your leg.  Drinking plenty of fluids may help for certain types of arthritis.  Follow your caregiver's dietary instructions.  Try low-impact exercise such as:  Swimming.  Water aerobics.  Biking.  Walking.  Morning stiffness is often relieved by a warm shower.  Put your joints through regular range-of-motion. SEEK MEDICAL CARE IF:   You do not feel better in 24 hours or are getting worse.  You have side effects to medications, or are not getting better with treatment. SEEK IMMEDIATE MEDICAL CARE IF:   You have a fever.  You develop severe joint pain, swelling or redness.  Many joints are involved and become painful and swollen.  There is severe back pain and/or leg weakness.  You have loss of bowel or bladder control. Document Released: 02/12/2004 Document Revised: 03/29/2011 Document Reviewed: 02/28/2008 [facility] Patient Information 2015 [place], [place]. This information is not intended to replace advice given to you by your health care provider. Make sure you discuss any questions you have with your health care provider.   Contusion A contusion is a deep bruise. Contusions are the result of an injury that caused bleeding under the skin. The contusion may turn blue, purple, or yellow. Minor injuries will give you a painless contusion, but more severe contusions may stay painful and swollen for a few weeks.  CAUSES  A contusion is usually caused by a blow, trauma, or direct force to an area of the body. SYMPTOMS   Swelling and redness of the injured area.  Bruising of the injured area.  Tenderness and soreness of the injured area.  Pain. DIAGNOSIS  The diagnosis can be made by taking a history and physical exam. An X-ray, CT scan, or MRI may be needed to determine if there were any associated injuries, such  as fractures. TREATMENT  Specific treatment will depend on what area of the body was injured. In general, the best treatment for a contusion is resting, icing, elevating, and applying cold compresses to the injured area. Over-the-counter medicines may also be recommended for pain control. Ask your caregiver what the best treatment is for your contusion. HOME CARE INSTRUCTIONS   Put ice on the injured area.  Put ice in a plastic bag.  Place a towel between your skin and the bag.  Leave the ice on for 15-20 minutes, 3-4 times a day, or as directed by your health care provider.  Only take over-the-counter or prescription medicines for pain, discomfort, or fever as directed by your caregiver. Your caregiver may recommend avoiding anti-inflammatory medicines (aspirin, ibuprofen, and naproxen) for 48 hours because these medicines may increase bruising.  Rest the injured area.  If possible, elevate the injured area to reduce swelling. SEEK IMMEDIATE MEDICAL CARE IF:   You have increased bruising or swelling.  You have pain that is getting worse.  Your swelling or pain is not relieved with medicines. MAKE SURE YOU:   Understand these instructions.  Will watch your condition.  Will get help right away if you are  not doing well or get worse. Document Released: 10/14/2004 Document Revised: 01/09/2013 Document Reviewed: 11/09/2010 [facility] Patient Information 2015 [place], [place]. This information is not intended to replace advice given to you by your health care provider. Make sure you discuss any questions you have with your health care provider.

## 2014-04-26 ENCOUNTER — Encounter (HOSPITAL_COMMUNITY): Payer: Self-pay | Admitting: *Deleted

## 2014-04-26 ENCOUNTER — Emergency Department (HOSPITAL_COMMUNITY)
Admission: EM | Admit: 2014-04-26 | Discharge: 2014-04-26 | Disposition: A | Discharge status: Home / Self Care | Payer: Medicare Other | Attending: Emergency Medicine | Admitting: Emergency Medicine

## 2014-04-26 ENCOUNTER — Emergency Department (HOSPITAL_COMMUNITY): Payer: Medicare Other

## 2014-04-26 DIAGNOSIS — S9002XA Contusion of left ankle, initial encounter: Secondary | ICD-10-CM | POA: Diagnosis not present

## 2014-04-26 DIAGNOSIS — Z91040 Latex allergy status: Secondary | ICD-10-CM | POA: Insufficient documentation

## 2014-04-26 DIAGNOSIS — W010XXA Fall on same level from slipping, tripping and stumbling without subsequent striking against object, initial encounter: Secondary | ICD-10-CM | POA: Insufficient documentation

## 2014-04-26 DIAGNOSIS — Z79899 Other long term (current) drug therapy: Secondary | ICD-10-CM | POA: Diagnosis not present

## 2014-04-26 DIAGNOSIS — Y9289 Other specified places as the place of occurrence of the external cause: Secondary | ICD-10-CM | POA: Insufficient documentation

## 2014-04-26 DIAGNOSIS — S99912A Unspecified injury of left ankle, initial encounter: Secondary | ICD-10-CM | POA: Diagnosis present

## 2014-04-26 DIAGNOSIS — Z8669 Personal history of other diseases of the nervous system and sense organs: Secondary | ICD-10-CM | POA: Diagnosis not present

## 2014-04-26 DIAGNOSIS — Y9389 Activity, other specified: Secondary | ICD-10-CM | POA: Insufficient documentation

## 2014-04-26 DIAGNOSIS — Y998 Other external cause status: Secondary | ICD-10-CM | POA: Diagnosis not present

## 2014-04-26 DIAGNOSIS — Z880 Allergy status to penicillin: Secondary | ICD-10-CM | POA: Insufficient documentation

## 2014-04-26 DIAGNOSIS — S79911A Unspecified injury of right hip, initial encounter: Secondary | ICD-10-CM | POA: Insufficient documentation

## 2014-04-26 DIAGNOSIS — W19XXXA Unspecified fall, initial encounter: Secondary | ICD-10-CM

## 2014-04-26 MED ORDER — ACETAMINOPHEN 500 MG PO TABS
1000.0000 mg | ORAL_TABLET | Freq: Once | ORAL | Status: AC
  Administered 2014-04-26: 1000 mg via ORAL
  Filled 2014-04-26: qty 2

## 2014-04-26 NOTE — ED Notes (Signed)
Pt tripped on foot pads of his W/C at [employer] [facility]. Pain lt ankle, with swelling.

## 2014-04-26 NOTE — ED Notes (Signed)
MD at bedside. 

## 2014-04-26 NOTE — ED Provider Notes (Signed)
CSN: [id number]     Arrival date & time 04/26/14  1431 History   First MD Initiated Contact with Patient 04/26/14 1512     Chief Complaint  Patient presents with  . Fall     (Consider location/radiation/quality/duration/timing/severity/associated sxs/prior Treatment) HPI  29 year old male with a history of multiple sclerosis and cerebral palsy presents from his group home after falling out of his wheelchair. Patient states that he tripped on the foot pads of his wheelchair and landed on his right side, exacerbating his right hip pain as well as causing left ankle pain and swelling. His left foot got caught up underneath his body. Patient states the pain as severe as unable to move his ankle due to the severity of pain. Denies any knee pain or other leg pain. Did not hit his head. States he took ibuprofen with no relief prior to arrival.  One of the staff members that accompany patient talk to me outside of his room and notes that he has a problem with narcotics and she does not even think he fell. She did not witness him fall but has not been around the whole time. She states that prior to coming to the ER after this fall he was walking normally to the car. He walked from the car to the emergency room as well when they arrived.  Past Medical History  Diagnosis Date  . MS (multiple sclerosis)   . CP (cerebral palsy)    Past Surgical History  Procedure Laterality Date  . Back surgery    . Leg surgery     Family History  Problem Relation Age of Onset  . Schizophrenia Mother   . Cirrhosis Father   . Heart failure Father   . Diabetes Father   . Diabetes Other    History  Substance Use Topics  . Smoking status: Former Smoker -- 0.50 packs/day  . Smokeless tobacco: Never Used  . Alcohol Use: No    Review of Systems  Musculoskeletal: Positive for joint swelling and arthralgias.  Skin: Negative for wound.  Neurological: Positive for numbness (chronic bilateral foot numbness).  Negative for weakness.  All other systems reviewed and are negative.     Allergies  Codeine; Penicillins; Sulfa antibiotics; Latex; Peanuts; Toradol; Other; and Tramadol  Home Medications   Prior to Admission medications   Medication Sig Start Date End Date Taking? Authorizing Provider  alprazolam Prudy Feeler) 2 MG tablet Take 2 mg by mouth 4 (four) times daily.    Yes Historical Provider, MD  clonazePAM (KLONOPIN) 2 MG tablet Take 2 mg by mouth 4 (four) times daily.    Yes Historical Provider, MD  FLUoxetine (PROZAC) 40 MG capsule Take 40 mg by mouth 2 (two) times daily.    Yes Historical Provider, MD  HYDROcodone-acetaminophen (NORCO/VICODIN) 5-325 MG per tablet Take 1-2 tablets by mouth every 4 (four) hours as needed. Patient not taking: Reported on 04/09/2014 03/26/14   Ivery Quale, PA-C  oxyCODONE-acetaminophen (PERCOCET/ROXICET) 5-325 MG per tablet Take 1 tablet by mouth every 4 (four) hours as needed. Patient not taking: Reported on 04/26/2014 04/09/14   Kristen N Ward, DO   BP 112/68 mmHg  Pulse 77  Temp(Src) 97.9 F (36.6 C) (Oral)  Resp 18  Ht 5\' 5"  (1.651 m)  Wt 103 lb (46.72 kg)  BMI 17.14 kg/m2  SpO2 99% Physical Exam  Constitutional: He is oriented to person, place, and time. He appears well-developed.  HENT:  Head: Normocephalic and atraumatic.  Right Ear: External  ear normal.  Left Ear: External ear normal.  Nose: Nose normal.  Eyes: Right eye exhibits no discharge. Left eye exhibits no discharge.  Neck: Neck supple.  Cardiovascular: Normal rate, regular rhythm, normal heart sounds and intact distal pulses.   Pulmonary/Chest: Effort normal.  Abdominal: Soft. There is no tenderness.  Musculoskeletal: He exhibits no edema.       Right hip: He exhibits tenderness (over right great trochanter).       Left knee: No tenderness found.       Left ankle: He exhibits decreased range of motion. He exhibits no swelling, no ecchymosis, no deformity and normal pulse. Tenderness.  Lateral malleolus tenderness found. No medial malleolus tenderness found.       Left lower leg: He exhibits no tenderness.       Right foot: There is no tenderness.       Left foot: There is no tenderness.  Neurological: He is alert and oriented to person, place, and time.  Skin: Skin is warm and dry.  Nursing note and vitals reviewed.   ED Course  Procedures (including critical care time) Labs Review Labs Reviewed - No data to display  Imaging Review Dg Ankle Complete Left  04/26/2014   CLINICAL DATA:  Fall with a left ankle pain and swelling.  EXAM: LEFT ANKLE COMPLETE - 3+ VIEW  COMPARISON:  None.  FINDINGS: There is no evidence of fracture, dislocation, or joint effusion. There is no evidence of arthropathy or other focal bone abnormality. Soft tissues are unremarkable.  IMPRESSION: Negative.   Electronically Signed   By: Elberta Fortis M.D.   On: 04/26/2014 15:52   Dg Hip Unilat With Pelvis 2-3 Views Right  04/26/2014   CLINICAL DATA:  The patient tripped today. Right hip pain. Initial encounter.  EXAM: RIGHT HIP (WITH PELVIS) 2-3 VIEWS  COMPARISON:  Plain films right hip 04/24/2014.  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 There is no acute bony or joint abnormality. Spinal dysraphism is noted.  IMPRESSION: No acute abnormality.   Electronically Signed   By: Drusilla Kanner M.D.   On: 04/26/2014 15:59     EKG Interpretation None      MDM   Final diagnoses:  Fall, initial encounter  Ankle contusion, left, initial encounter    Patient with a mechanical fall and no significant injuries noted. Possible ankle sprain/contusion based on history but no significant swelling or ecchymosis. Patient has a history of frequent falls, with 5 ER presentations in the last 1 month. Previous notes indicate concern for narcotic seeking behavior. Given no appreciable injury do not feel he needs narcotics at this point, recommended ice, Tylenol, and ibuprofen.    Pricilla Loveless,  MD 04/26/14 [phone]

## 2014-04-26 NOTE — Discharge Instructions (Signed)
Contusion °A contusion is a deep bruise. Contusions are the result of an injury that caused bleeding under the skin. The contusion may turn blue, purple, or yellow. Minor injuries will give you a painless contusion, but more severe contusions may stay painful and swollen for a few weeks.  °CAUSES  °A contusion is usually caused by a blow, trauma, or direct force to an area of the body. °SYMPTOMS  °· Swelling and redness of the injured area. °· Bruising of the injured area. °· Tenderness and soreness of the injured area. °· Pain. °DIAGNOSIS  °The diagnosis can be made by taking a history and physical exam. An X-ray, CT scan, or MRI may be needed to determine if there were any associated injuries, such as fractures. °TREATMENT  °Specific treatment will depend on what area of the body was injured. In general, the best treatment for a contusion is resting, icing, elevating, and applying cold compresses to the injured area. Over-the-counter medicines may also be recommended for pain control. Ask your caregiver what the best treatment is for your contusion. °HOME CARE INSTRUCTIONS  °· Put ice on the injured area. °¨ Put ice in a plastic bag. °¨ Place a towel between your skin and the bag. °¨ Leave the ice on for 15-20 minutes, 3-4 times a day, or as directed by your health care provider. °· Only take over-the-counter or prescription medicines for pain, discomfort, or fever as directed by your caregiver. Your caregiver may recommend avoiding anti-inflammatory medicines (aspirin, ibuprofen, and naproxen) for 48 hours because these medicines may increase bruising. °· Rest the injured area. °· If possible, elevate the injured area to reduce swelling. °SEEK IMMEDIATE MEDICAL CARE IF:  °· You have increased bruising or swelling. °· You have pain that is getting worse. °· Your swelling or pain is not relieved with medicines. °MAKE SURE YOU:  °· Understand these instructions. °· Will watch your condition. °· Will get help right  away if you are not doing well or get worse. °Document Released: 10/14/2004 Document Revised: 01/09/2013 Document Reviewed: 11/09/2010 °[employer]® Patient Information ©2015 [employer]. This information is not intended to replace advice given to you by your health care provider. Make sure you discuss any questions you have with your health care provider. ° °

## 2014-04-29 ENCOUNTER — Ambulatory Visit (INDEPENDENT_AMBULATORY_CARE_PROVIDER_SITE_OTHER): Payer: Medicare Other | Admitting: Orthopedic Surgery

## 2014-04-29 VITALS — BP 107/57 | Ht 65.0 in | Wt 103.0 lb

## 2014-04-29 DIAGNOSIS — S7001XA Contusion of right hip, initial encounter: Secondary | ICD-10-CM | POA: Diagnosis not present

## 2014-04-29 MED ORDER — OXYCODONE-ACETAMINOPHEN 5-325 MG PO TABS: 1.0000 | ORAL_TABLET | ORAL | Status: DC | PRN

## 2014-04-29 NOTE — Patient Instructions (Signed)
WE WILL ORDER HOME HEALTH PT FOR YOU

## 2014-04-30 ENCOUNTER — Encounter: Payer: Self-pay | Admitting: Orthopedic Surgery

## 2014-04-30 NOTE — Progress Notes (Signed)
Patient ID: Elijah Palmer, male   DOB: [birth year], 29 y.o.   MRN: [id number] Patient ID: Elijah Palmer, male   DOB: 09/06/1985, 29 y.o.   MRN: [id number]  Chief Complaint  Patient presents with  . Hip Pain    Right hip pain,      Elijah Palmer is a 29 y.o. male.   HPI 29 year old male with a history of spastic diplegia and multiple sclerosis who normally walks without assistive device fell on March 18 again on the 21st and again on the sixth and has had trouble walking since that time. He had x-rays of his hip which show no fracture. He has a large contusion over the right hip with swelling. At the age of 47 he had hamstring lengthening with rectus stitch sartorius transfer [facility]   Review of Systems Numbness and tingling no evidence of that but he does have muscle spasms and tightness of his musculature.  Past Medical History  Diagnosis Date  . MS (multiple sclerosis)   . CP (cerebral palsy)     Past Surgical History  Procedure Laterality Date  . Back surgery    . Leg surgery      Family History  Problem Relation Age of Onset  . Schizophrenia Mother   . Cirrhosis Father   . Heart failure Father   . Diabetes Father   . Diabetes Other     Social History History  Substance Use Topics  . Smoking status: Former Smoker -- 0.50 packs/day  . Smokeless tobacco: Never Used  . Alcohol Use: No    Allergies  Allergen Reactions  . Codeine Anaphylaxis  . Penicillins Anaphylaxis  . Sulfa Antibiotics Anaphylaxis    Sulfa Drugs  . Latex Hives  . Peanuts [Peanut Oil]   . Toradol [Ketorolac Tromethamine]   . Other Rash    Peanuts and Pineapple  . Tramadol Rash    Current Outpatient Prescriptions  Medication Sig Dispense Refill  . oxyCODONE-acetaminophen (PERCOCET/ROXICET) 5-325 MG per tablet Take 1 tablet by mouth every 4 (four) hours as needed. 10 tablet 0  . alprazolam (XANAX) 2 MG tablet Take 2 mg by mouth 4 (four) times daily.     . clonazePAM  (KLONOPIN) 2 MG tablet Take 2 mg by mouth 4 (four) times daily.     Marland Kitchen FLUoxetine (PROZAC) 40 MG capsule Take 40 mg by mouth 2 (two) times daily.     Marland Kitchen HYDROcodone-acetaminophen (NORCO/VICODIN) 5-325 MG per tablet Take 1-2 tablets by mouth every 4 (four) hours as needed. (Patient not taking: Reported on 04/09/2014) 15 tablet 0  . oxyCODONE-acetaminophen (PERCOCET/ROXICET) 5-325 MG per tablet Take 1 tablet by mouth every 4 (four) hours as needed for severe pain. 90 tablet 0   No current facility-administered medications for this visit.       Physical Exam Blood pressure 107/57, height  (1.651 m), weight 103 lb (46.72 kg). Physical Exam The patient is well developed well nourished and well groomed. Orientation to person place and time is normal  Mood is pleasant. Ambulatory status is not able to ambulate he was able to stand He had a large contusion with swelling over the right hip and greater trochanter which was not present on the left. He had painless range of motion in the groin and slight adduction contracture bilaterally the hip was stable although on x-ray it shows some mild subluxation normal pulse and perfusion were noted distally Left hip again showed no contusion no swelling in his passive  range of motion increased muscle tension and tightness no instability normal sensation Data Reviewed The x-ray imaging does not show a fracture it does show valgus of the hip with subluxation consistent with his spastic diplegia  Assessment Contusion right hip Plan Physical therapy. He is allergic to codeine. Hydrocodone makes him itch. I put him on Percocet 5 mg every 4 when necessary pain #90 he can take this for up to 4 weeks but no longer. Patient made aware. Follow-up 6 weeks

## 2014-05-06 ENCOUNTER — Telehealth: Payer: Self-pay | Admitting: *Deleted

## 2014-05-06 NOTE — Addendum Note (Signed)
Addended by: Adella Hare B on: 05/06/2014 10:24 AM   Modules accepted: Orders

## 2014-05-06 NOTE — Telephone Encounter (Signed)
REFERRAL FAXED TO [facility] FOR PT 

## 2014-05-07 ENCOUNTER — Telehealth: Payer: Self-pay | Admitting: Orthopedic Surgery

## 2014-05-07 NOTE — Telephone Encounter (Signed)
Patient is calling stating that the oxyCODONE-acetaminophen (PERCOCET/ROXICET) 5-325 MG per tablet is not enough, its not keeping the pain at bay, please advise?

## 2014-05-07 NOTE — Telephone Encounter (Signed)
Spoke with patient and advised that Dr is out of office this week so no changes can be made but also advised that the medication he is on is the strongest that can be prescribed for his problem.

## 2014-05-08 ENCOUNTER — Telehealth: Payer: Self-pay | Admitting: Orthopedic Surgery

## 2014-05-08 NOTE — Telephone Encounter (Signed)
HOME EVALUATION WAS CONTINUED FOR 1 MORE MONTH IN HOME PER PETER

## 2014-05-09 ENCOUNTER — Other Ambulatory Visit: Payer: Self-pay | Admitting: Neurology

## 2014-05-09 DIAGNOSIS — G35 Multiple sclerosis: Secondary | ICD-10-CM

## 2014-05-09 DIAGNOSIS — G35D Multiple sclerosis, unspecified: Secondary | ICD-10-CM

## 2014-05-17 ENCOUNTER — Ambulatory Visit (HOSPITAL_COMMUNITY): Admission: RE | Admit: 2014-05-17 | Payer: Medicare Other | Source: Ambulatory Visit

## 2014-05-20 ENCOUNTER — Telehealth: Payer: Self-pay | Admitting: Orthopedic Surgery

## 2014-05-20 ENCOUNTER — Other Ambulatory Visit: Payer: Self-pay | Admitting: *Deleted

## 2014-05-20 MED ORDER — HYDROCODONE-ACETAMINOPHEN 10-325 MG PO TABS: 1.0000 | ORAL_TABLET | Freq: Four times a day (QID) | ORAL | Status: AC | PRN

## 2014-05-20 NOTE — Telephone Encounter (Signed)
Patient is calling requesting a refill on pain medication oxyCODONE-acetaminophen (PERCOCET/ROXICET) 5-325 MG per tablet please advise? 

## 2014-05-20 NOTE — Telephone Encounter (Signed)
Elijah Palmer from Group Home picked up Rx, Patient is not allowed to have Rx in his possession at any point or time per Mrs. Rucker. Patient has a narcotic abuse issue and medications has to be monitored at all times per [facility].

## 2014-05-20 NOTE — Telephone Encounter (Signed)
Prescription available, called patient, left vm 

## 2014-06-10 ENCOUNTER — Ambulatory Visit (INDEPENDENT_AMBULATORY_CARE_PROVIDER_SITE_OTHER): Payer: Medicare Other | Admitting: Orthopedic Surgery

## 2014-06-10 ENCOUNTER — Encounter: Payer: Self-pay | Admitting: Orthopedic Surgery

## 2014-06-10 VITALS — BP 133/61 | Ht 65.0 in | Wt 103.0 lb

## 2014-06-10 DIAGNOSIS — S7001XD Contusion of right hip, subsequent encounter: Secondary | ICD-10-CM

## 2014-06-10 MED ORDER — HYDROCODONE-ACETAMINOPHEN 7.5-325 MG PO TABS: 1.0000 | ORAL_TABLET | Freq: Four times a day (QID) | ORAL | Status: AC | PRN

## 2014-06-10 NOTE — Progress Notes (Signed)
This is a follow-up visit  Diagnosis contusion, established problem no change  Patient has continued pain over the right hip despite negative x-rays. He also has a history of diplegia with hip subluxation  Review of systems no neurologic issues at this point no new problems related to that.  His medication today's decreased from 10 mg to 7.5 mg of hydrocodone and explained to him that we have to do that unless there is some major injury fracture or surgery  Examination reveals decreased swelling over the right hip note that he has no change in the range of motion in the hip as previously noted in the prior note. The hip does not subluxate. He has some swelling the skin is intact. He seems to be oriented but maybe has some effect of the medication as he is very sluggish. Vital signs: BP 133/61 mmHg  Ht 5\' 5"  (1.651 m)  Wt 103 lb (46.72 kg)  BMI 17.14 kg/m2  Appearance well-groomed  Impression improving contusion right hip  Return 6 weeks

## 2014-06-15 ENCOUNTER — Emergency Department
Admission: EM | Admit: 2014-06-15 | Discharge: 2014-06-15 | Disposition: A | Discharge status: Home / Self Care | Payer: Medicare Other | Attending: Emergency Medicine | Admitting: Emergency Medicine

## 2014-06-15 ENCOUNTER — Encounter: Payer: Self-pay | Admitting: Emergency Medicine

## 2014-06-15 DIAGNOSIS — X838XXA Intentional self-harm by other specified means, initial encounter: Secondary | ICD-10-CM | POA: Insufficient documentation

## 2014-06-15 DIAGNOSIS — F1110 Opioid abuse, uncomplicated: Secondary | ICD-10-CM | POA: Diagnosis not present

## 2014-06-15 DIAGNOSIS — F329 Major depressive disorder, single episode, unspecified: Secondary | ICD-10-CM | POA: Insufficient documentation

## 2014-06-15 DIAGNOSIS — G8929 Other chronic pain: Secondary | ICD-10-CM | POA: Diagnosis not present

## 2014-06-15 DIAGNOSIS — Z880 Allergy status to penicillin: Secondary | ICD-10-CM | POA: Diagnosis not present

## 2014-06-15 DIAGNOSIS — F1310 Sedative, hypnotic or anxiolytic abuse, uncomplicated: Secondary | ICD-10-CM | POA: Insufficient documentation

## 2014-06-15 DIAGNOSIS — F32A Depression, unspecified: Secondary | ICD-10-CM

## 2014-06-15 DIAGNOSIS — Z79899 Other long term (current) drug therapy: Secondary | ICD-10-CM | POA: Diagnosis not present

## 2014-06-15 DIAGNOSIS — F419 Anxiety disorder, unspecified: Secondary | ICD-10-CM | POA: Insufficient documentation

## 2014-06-15 DIAGNOSIS — Z720 Tobacco use: Secondary | ICD-10-CM | POA: Insufficient documentation

## 2014-06-15 DIAGNOSIS — Z91040 Latex allergy status: Secondary | ICD-10-CM | POA: Diagnosis not present

## 2014-06-15 DIAGNOSIS — Z008 Encounter for other general examination: Secondary | ICD-10-CM | POA: Diagnosis present

## 2014-06-15 DIAGNOSIS — Y9289 Other specified places as the place of occurrence of the external cause: Secondary | ICD-10-CM | POA: Insufficient documentation

## 2014-06-15 DIAGNOSIS — M25559 Pain in unspecified hip: Secondary | ICD-10-CM | POA: Insufficient documentation

## 2014-06-15 DIAGNOSIS — Y9389 Activity, other specified: Secondary | ICD-10-CM | POA: Insufficient documentation

## 2014-06-15 DIAGNOSIS — S50811A Abrasion of right forearm, initial encounter: Secondary | ICD-10-CM | POA: Insufficient documentation

## 2014-06-15 DIAGNOSIS — Y998 Other external cause status: Secondary | ICD-10-CM | POA: Insufficient documentation

## 2014-06-15 HISTORY — DX: Fracture of unspecified part of neck of right femur, initial encounter for closed fracture: S72.001A

## 2014-06-15 LAB — COMPREHENSIVE METABOLIC PANEL
ALT: 16 U/L — AB (ref 17–63)
ANION GAP: 7 (ref 5–15)
AST: 20 U/L (ref 15–41)
Albumin: 4 g/dL (ref 3.5–5.0)
Alkaline Phosphatase: 76 U/L (ref 38–126)
BILIRUBIN TOTAL: 0.6 mg/dL (ref 0.3–1.2)
BUN: 18 mg/dL (ref 6–20)
CALCIUM: 9.2 mg/dL (ref 8.9–10.3)
CO2: 30 mmol/L (ref 22–32)
Chloride: 100 mmol/L — ABNORMAL LOW (ref 101–111)
Creatinine, Ser: 0.88 mg/dL (ref 0.61–1.24)
Glucose, Bld: 103 mg/dL — ABNORMAL HIGH (ref 65–99)
Potassium: 4.2 mmol/L (ref 3.5–5.1)
Sodium: 137 mmol/L (ref 135–145)
TOTAL PROTEIN: 7.1 g/dL (ref 6.5–8.1)

## 2014-06-15 LAB — URINE DRUG SCREEN, QUALITATIVE (ARMC ONLY)
Amphetamines, Ur Screen: NOT DETECTED
Barbiturates, Ur Screen: NOT DETECTED
Benzodiazepine, Ur Scrn: POSITIVE — AB
Cannabinoid 50 Ng, Ur ~~LOC~~: NOT DETECTED
Cocaine Metabolite,Ur ~~LOC~~: NOT DETECTED
MDMA (Ecstasy)Ur Screen: NOT DETECTED
Methadone Scn, Ur: NOT DETECTED
Opiate, Ur Screen: POSITIVE — AB
Phencyclidine (PCP) Ur S: NOT DETECTED
Tricyclic, Ur Screen: POSITIVE — AB

## 2014-06-15 LAB — CBC WITH DIFFERENTIAL/PLATELET
BASOS ABS: 0 10*3/uL (ref 0–0.1)
Basophils Relative: 0 %
Eosinophils Absolute: 0.1 10*3/uL (ref 0–0.7)
Eosinophils Relative: 2 %
HCT: 45.4 % (ref 40.0–52.0)
HEMOGLOBIN: 15.4 g/dL (ref 13.0–18.0)
Lymphocytes Relative: 26 %
Lymphs Abs: 1.5 10*3/uL (ref 1.0–3.6)
MCH: 30.4 pg (ref 26.0–34.0)
MCHC: 33.9 g/dL (ref 32.0–36.0)
MCV: 89.6 fL (ref 80.0–100.0)
MONOS PCT: 7 %
Monocytes Absolute: 0.4 10*3/uL (ref 0.2–1.0)
Neutro Abs: 3.8 10*3/uL (ref 1.4–6.5)
Neutrophils Relative %: 65 %
Platelets: 162 10*3/uL (ref 150–440)
RBC: 5.07 MIL/uL (ref 4.40–5.90)
RDW: 12.9 % (ref 11.5–14.5)
WBC: 5.9 10*3/uL (ref 3.8–10.6)

## 2014-06-15 LAB — ETHANOL: Alcohol, Ethyl (B): <5 mg/dL (ref <5)

## 2014-06-15 MED ORDER — HYDROCODONE-ACETAMINOPHEN 7.5-325 MG PO TABS
ORAL_TABLET | ORAL | Status: AC
  Filled 2014-06-15: qty 1

## 2014-06-15 MED ORDER — HYDROCODONE-ACETAMINOPHEN 7.5-325 MG PO TABS
1.0000 | ORAL_TABLET | Freq: Four times a day (QID) | ORAL | Status: DC | PRN
Start: 2014-06-15 — End: 2014-06-15
  Administered 2014-06-15: 1 via ORAL

## 2014-06-15 NOTE — ED Notes (Signed)
BEHAVIORAL HEALTH ROUNDING Patient sleeping: No. Patient alert and oriented: yes Behavior appropriate: Yes.  ; If no, describe:   Nutrition and fluids offered: Yes  Toileting and hygiene offered: Yes  Sitter present: no Law enforcement present: Yes  and ODS  ENVIRONMENTAL ASSESSMENT Potentially harmful objects out of patient reach: Yes.   Personal belongings secured: Yes.   Patient dressed in hospital provided attire only: Yes.   Plastic bags out of patient reach: Yes.   Patient care equipment (cords, cables, call bells, lines, and drains) shortened, removed, or accounted for: Yes.   Equipment and supplies removed from bottom of stretcher: Yes.   Potentially toxic materials out of patient reach: Yes.   Sharps container removed or out of patient reach: No.
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
## 2014-06-15 NOTE — Consult Note (Signed)
[facility] Consult   Reason for Consult:  Follow up Referring Physician:  ER Patient Identification: Elijah Palmer MRN:  [id number] Principal Diagnosis: <principal problem not specified> Diagnosis:  There are no active problems to display for this patient.   Total Time spent with patient: 1 hour  Subjective:   Elijah Palmer is a 29 y.o. male patient admitted with problems wit mother..PT is disabled with S/C Cerebral palsy Pt lives at [place] and loves it there. Mother called and asked him to kill himself so that she can get his Insurace. Pt got depressed and "cut his arm."   HPI:  Got depressed because of family conflicts. HPI Elements:     Past Medical History:  Past Medical History  Diagnosis Date  . MS (multiple sclerosis)   . CP (cerebral palsy)   . Closed right hip fracture     Past Surgical History  Procedure Laterality Date  . Back surgery    . Leg surgery     Family History:  Family History  Problem Relation Age of Onset  . Schizophrenia Mother   . Cirrhosis Father   . Heart failure Father   . Diabetes Father   . Diabetes Other    Social History:  History  Alcohol Use No     History  Drug Use No    History   Social History  . Marital Status: Single    Spouse Name: N/A  . Number of Children: N/A  . Years of Education: N/A   Social History Main Topics  . Smoking status: Current Every Day Smoker -- 0.10 packs/day  . Smokeless tobacco: Never Used  . Alcohol Use: No  . Drug Use: No  . Sexual Activity: Not on file   Other Topics Concern  . None   Social History Narrative   Additional Social History:                          Allergies:   Allergies  Allergen Reactions  . Codeine Anaphylaxis  . Penicillins Anaphylaxis  . Sulfa Antibiotics Anaphylaxis    Sulfa Drugs  . Latex Hives  . Peanuts [Peanut Oil]   . Toradol [Ketorolac Tromethamine]   . Other Rash    Peanuts and Pineapple  . Tramadol Rash     Labs:  Results for orders placed or performed during the hospital encounter of 06/15/14 (from the past 48 hour(s))  CBC WITH DIFFERENTIAL     Status: None   Collection Time: 06/15/14  2:46 PM  Result Value Ref Range   WBC 5.9 3.8 - 10.6 K/uL   RBC 5.07 4.40 - 5.90 MIL/uL   Hemoglobin 15.4 13.0 - 18.0 g/dL   HCT 45.4 40.0 - 52.0 %   MCV 89.6 80.0 - 100.0 fL   MCH 30.4 26.0 - 34.0 pg   MCHC 33.9 32.0 - 36.0 g/dL   RDW 12.9 11.5 - 14.5 %   Platelets 162 150 - 440 K/uL   Neutrophils Relative % 65 %   Neutro Abs 3.8 1.4 - 6.5 K/uL   Lymphocytes Relative 26 %   Lymphs Abs 1.5 1.0 - 3.6 K/uL   Monocytes Relative 7 %   Monocytes Absolute 0.4 0.2 - 1.0 K/uL   Eosinophils Relative 2 %   Eosinophils Absolute 0.1 0 - 0.7 K/uL   Basophils Relative 0 %   Basophils Absolute 0.0 0 - 0.1 K/uL  Comprehensive metabolic panel  Status: Abnormal   Collection Time: 06/15/14  2:46 PM  Result Value Ref Range   Sodium 137 135 - 145 mmol/L   Potassium 4.2 3.5 - 5.1 mmol/L   Chloride 100 (L) 101 - 111 mmol/L   CO2 30 22 - 32 mmol/L   Glucose, Bld 103 (H) 65 - 99 mg/dL   BUN 18 6 - 20 mg/dL   Creatinine, Ser 0.88 0.61 - 1.24 mg/dL   Calcium 9.2 8.9 - 10.3 mg/dL   Total Protein 7.1 6.5 - 8.1 g/dL   Albumin 4.0 3.5 - 5.0 g/dL   AST 20 15 - 41 U/L   ALT 16 (L) 17 - 63 U/L   Alkaline Phosphatase 76 38 - 126 U/L   Total Bilirubin 0.6 0.3 - 1.2 mg/dL   GFR calc non Af Amer >60 >60 mL/min   GFR calc Af Amer >60 >60 mL/min    Comment: (NOTE) The eGFR has been calculated using the CKD EPI equation. This calculation has not been validated in all clinical situations. eGFR's persistently <60 mL/min signify possible Chronic Kidney Disease.    Anion gap 7 5 - 15  Ethanol     Status: None   Collection Time: 06/15/14  2:46 PM  Result Value Ref Range   Alcohol, Ethyl (B) <5 <5 mg/dL    Comment:        LOWEST DETECTABLE LIMIT FOR SERUM ALCOHOL IS 11 mg/dL FOR MEDICAL PURPOSES ONLY   Urine Drug  Screen, Qualitative [facility])     Status: Abnormal   Collection Time: 06/15/14  3:21 PM  Result Value Ref Range   Tricyclic, Ur Screen POSITIVE (A) NONE DETECTED   Amphetamines, Ur Screen NONE DETECTED NONE DETECTED   MDMA (Ecstasy)Ur Screen NONE DETECTED NONE DETECTED   Cocaine Metabolite,Ur [place] NONE DETECTED NONE DETECTED   Opiate, Ur Screen POSITIVE (A) NONE DETECTED   Phencyclidine (PCP) Ur S NONE DETECTED NONE DETECTED   Cannabinoid 50 Ng, Ur [place] NONE DETECTED NONE DETECTED   Barbiturates, Ur Screen NONE DETECTED NONE DETECTED   Benzodiazepine, Ur Scrn POSITIVE (A) NONE DETECTED   Methadone Scn, Ur NONE DETECTED NONE DETECTED    Comment: (NOTE) 263  Tricyclics, urine               Cutoff 1000 ng/mL 200  Amphetamines, urine             Cutoff 1000 ng/mL 300  MDMA (Ecstasy), urine           Cutoff 500 ng/mL 400  Cocaine Metabolite, urine       Cutoff 300 ng/mL 500  Opiate, urine                   Cutoff 300 ng/mL 600  Phencyclidine (PCP), urine      Cutoff 25 ng/mL 700  Cannabinoid, urine              Cutoff 50 ng/mL 800  Barbiturates, urine             Cutoff 200 ng/mL 900  Benzodiazepine, urine           Cutoff 200 ng/mL 1000 Methadone, urine                Cutoff 300 ng/mL 1100 1200 The urine drug screen provides only a preliminary, unconfirmed 1300 analytical test result and should not be used for non-medical 1400 purposes. Clinical consideration and professional judgment should 1500 be applied to any positive drug screen  result due to possible 1600 interfering substances. A more specific alternate chemical method 1700 must be used in order to obtain a confirmed analytical result.  1800 Gas chromato graphy / mass spectrometry (GC/MS) is the preferred 1900 confirmatory method.     Vitals: Blood pressure 110/64, pulse 87, temperature 98.4 F (36.9 C), temperature source Oral, resp. rate 18, height 5' 2"  (1.575 m), weight 46.267 kg (102 lb), SpO2 98 %.  Risk to Self: Is patient  at risk for suicide?: No, but patient needs Medical Clearance Risk to Others:   Prior Inpatient Therapy:   Prior Outpatient Therapy:    Current Facility-Administered Medications  Medication Dose Route Frequency Provider Last Rate Last Dose  . HYDROcodone-acetaminophen (NORCO) 7.5-325 MG per tablet 1 tablet  1 tablet Oral Q6H PRN Carrie Mew, MD   1 tablet at 06/15/14 1700  . HYDROcodone-acetaminophen (NORCO) 7.5-325 MG per tablet            Current Outpatient Prescriptions  Medication Sig Dispense Refill  . alprazolam (XANAX) 2 MG tablet Take 2 mg by mouth 4 (four) times daily.     Marland Kitchen amitriptyline (ELAVIL) 25 MG tablet Take 25 mg by mouth at bedtime.    . clonazePAM (KLONOPIN) 2 MG tablet Take 2 mg by mouth 4 (four) times daily.     Marland Kitchen FLUoxetine (PROZAC) 40 MG capsule Take 40 mg by mouth 2 (two) times daily.     Marland Kitchen HYDROcodone-acetaminophen (NORCO) 7.5-325 MG per tablet Take 1 tablet by mouth every 6 (six) hours as needed for moderate pain. 84 tablet 0  . HYDROcodone-acetaminophen (NORCO) 10-325 MG per tablet Take 1 tablet by mouth every 6 (six) hours as needed. 84 tablet 0    Musculoskeletal: Strength & Muscle Tone: decreased Gait & Station: not tested Patient leans: not tested as pt has weakness  Psychiatric Specialty Exam: Physical Exam  Review of Systems  Constitutional: Negative.   HENT: Negative.   Eyes: Negative.   Respiratory: Negative.   Cardiovascular: Negative.   Gastrointestinal: Negative.   Musculoskeletal: Negative.   Skin: Negative.   Neurological: Positive for focal weakness.  Psychiatric/Behavioral: Positive for depression. The patient is nervous/anxious.     Blood pressure 110/64, pulse 87, temperature 98.4 F (36.9 C), temperature source Oral, resp. rate 18, height 5' 2"  (1.575 m), weight 46.267 kg (102 lb), SpO2 98 %.Body mass index is 18.65 kg/(m^2).  General Appearance: Casual  Eye Contact::  Good  Speech:  Clear and Coherent  Volume:  Normal   Mood:  Anxious  Affect:  Appropriate  Thought Process:  Goal Directed  Orientation:  Full (Time, Place, and Person)  Thought Content:  Negative  Suicidal Thoughts:  No  Homicidal Thoughts:  No  Memory:  NA  Judgement:  Intact  Insight:  Good  Psychomotor Activity:  not tested as pt has cerebral palsy  Concentration:  Fair  Recall:  [employer] of Knowledge:Good  Language: Good  Akathisia:  No  Handed:  Right  AIMS (if indicated):     Assets:  Communication Skills Desire for Improvement Financial Resources/Insurance Housing Resilience  ADL's:  Intact  Cognition: WNL  Sleep:      Medical Decision Making: Established Problem, Stable/Improving (1)  Treatment Plan Summary: Plan Discharge pt to [facility]and has meds there.  Plan:  Supportive therapy provided about ongoing stressors. Disposition: as above  Prabhjot Piscitello K 06/15/2014 7:10 PM

## 2014-06-15 NOTE — Discharge Instructions (Signed)
Depression °Depression refers to feeling sad, low, down in the dumps, blue, gloomy, or empty. In general, there are two kinds of depression: °· Normal sadness or normal grief. This kind of depression is one that we all feel from time to time after upsetting life experiences, such as the loss of a job or the ending of a relationship. This kind of depression is considered normal, is short lived, and resolves within a few days to 2 weeks. Depression experienced after the loss of a loved one (bereavement) often lasts longer than 2 weeks but normally gets better with time. °· Clinical depression. This kind of depression lasts longer than normal sadness or normal grief or interferes with your ability to function at home, at work, and in school. It also interferes with your personal relationships. It affects almost every aspect of your life. Clinical depression is an illness. °Symptoms of depression can also be caused by conditions other than those mentioned above, such as: °· Physical illness. Some physical illnesses, including underactive thyroid gland (hypothyroidism), severe anemia, specific types of cancer, diabetes, uncontrolled seizures, heart and lung problems, strokes, and chronic pain are commonly associated with symptoms of depression. °· Side effects of some prescription medicine. In some people, certain types of medicine can cause symptoms of depression. °· Substance abuse. Abuse of alcohol and illicit drugs can cause symptoms of depression. °SYMPTOMS °Symptoms of normal sadness and normal grief include the following: °· Feeling sad or crying for short periods of time. °· Not caring about anything (apathy). °· Difficulty sleeping or sleeping too much. °· No longer able to enjoy the things you used to enjoy. °· Desire to be by oneself all the time (social isolation). °· Lack of energy or motivation. °· Difficulty concentrating or remembering. °· Change in appetite or weight. °· Restlessness or  agitation. °Symptoms of clinical depression include the same symptoms of normal sadness or normal grief and also the following symptoms: °· Feeling sad or crying all the time. °· Feelings of guilt or worthlessness. °· Feelings of hopelessness or helplessness. °· Thoughts of suicide or the desire to harm yourself (suicidal ideation). °· Loss of touch with reality (psychotic symptoms). Seeing or hearing things that are not real (hallucinations) or having false beliefs about your life or the people around you (delusions and paranoia). °DIAGNOSIS  °The diagnosis of clinical depression is usually based on how bad the symptoms are and how long they have lasted. Your health care provider will also ask you questions about your medical history and substance use to find out if physical illness, use of prescription medicine, or substance abuse is causing your depression. Your health care provider may also order blood tests. °TREATMENT  °Often, normal sadness and normal grief do not require treatment. However, sometimes antidepressant medicine is given for bereavement to ease the depressive symptoms until they resolve. °The treatment for clinical depression depends on how bad the symptoms are but often includes antidepressant medicine, counseling with a mental health professional, or both. Your health care provider will help to determine what treatment is best for you. °Depression caused by physical illness usually goes away with appropriate medical treatment of the illness. If prescription medicine is causing depression, talk with your health care provider about stopping the medicine, decreasing the dose, or changing to another medicine. °Depression caused by the abuse of alcohol or illicit drugs goes away when you stop using these substances. Some adults need professional help in order to stop drinking or using drugs. °SEEK IMMEDIATE MEDICAL   CARE IF: °· You have thoughts about hurting yourself or others. °· You lose touch  with reality (have psychotic symptoms). °· You are taking medicine for depression and have a serious side effect. °FOR MORE INFORMATION °· National Alliance on Mental Illness: www.nami.org  °· National Institute of Mental Health: www.nimh.nih.gov  °Document Released: 01/02/2000 Document Revised: 05/21/2013 Document Reviewed: 04/05/2011 °ExitCare® Patient Information ©2015 [employer]. This information is not intended to replace advice given to you by your health care provider. Make sure you discuss any questions you have with your health care provider. ° °Suicidal Feelings, How to Help Yourself °Everyone feels sad or unhappy at times, but depressing thoughts and feelings of hopelessness can lead to thoughts of suicide. It can seem as if life is too tough to handle. If you feel as though you have reached the point where suicide is the only answer, it is time to let someone know immediately.  °HOW TO COPE AND PREVENT SUICIDE °· Let family, friends, teachers, or counselors know. Get help. Try not to isolate yourself from those who care about you. Even though you may not feel sociable, talk with someone every day. It is best if it is face-to-face. Remember, they will want to help you. °· Eat a regularly spaced and well-balanced diet. °· Get plenty of rest. °· Avoid alcohol and drugs because they will only make you feel worse and may also lower your inhibitions. Remove them from the home. If you are thinking of taking an overdose of your prescribed medicines, give your medicines to someone who can give them to you one day at a time. If you are on antidepressants, let your caregiver know of your feelings so he or she can provide a safer medicine, if that is a concern. °· Remove weapons or poisons from your home. °· Try to stick to routines. Follow a schedule and remind yourself that you have to keep that schedule every day. °· Set some realistic goals and achieve them. Make a list and cross things off as you go.  Accomplishments give a sense of worth. Wait until you are feeling better before doing things you find difficult or unpleasant to do. °· If you are able, try to start exercising. Even half-hour periods of exercise each day will make you feel better. Getting out in the sun or into nature helps you recover from depression faster. If you have a favorite place to walk, take advantage of that. °· Increase safe activities that have always given you pleasure. This may include playing your favorite music, reading a good book, painting a picture, or playing your favorite instrument. Do whatever takes your mind off your depression. °· Keep your living space well-lighted. °GET HELP °Contact a suicide hotline, crisis center, or local suicide prevention center for help right away. Local centers may include a hospital, clinic, community service organization, social service provider, or health department. °· Call your local emergency services (911 in the [place]). °· Call a suicide hotline: °¨ 1-800-273-TALK (1-800-273-8255) in the [place]. °¨ 1-800-SUICIDE (1-800-784-2433) in the [place]. °¨ 1-888-628-9454 in the [place] for Spanish-speaking counselors. °¨ 1-800-799-4TTY (1-800-799-4889) in the [place] for TTY users. °· Visit the following websites for information and help: °¨ National Suicide Prevention Lifeline: www.suicidepreventionlifeline.org °¨ Hopeline: www.hopeline.com °¨ American Foundation for Suicide Prevention: www.afsp.org °· For lesbian, gay, bisexual, transgender, or questioning youth, contact The [employer]: °¨ 1-866-4-U-TREVOR (1-866-488-7386) in the [place]. °¨ www.thetrevorproject.org °· In [place], treatment resources are listed in each   province with listings available under [employer] or similar titles. Another source for Crisis Centres by Province is located at  http://www.suicideprevention.ca/in-crisis-now/find-a-crisis-centre-now/crisis-centres °Document Released: 07/11/2002 Document Revised: 03/29/2011 Document Reviewed: 05/01/2013 °ExitCare® Patient Information ©2015 [employer]. This information is not intended to replace advice given to you by your health care provider. Make sure you discuss any questions you have with your health care provider. ° °

## 2014-06-15 NOTE — ED Notes (Signed)
ENVIRONMENTAL ASSESSMENT Potentially harmful objects out of patient reach: Yes.   Personal belongings secured: Yes.   Patient dressed in hospital provided attire only: Yes.   Plastic bags out of patient reach: Yes.   Patient care equipment (cords, cables, call bells, lines, and drains) shortened, removed, or accounted for: Yes.   Equipment and supplies removed from bottom of stretcher: Yes.   Potentially toxic materials out of patient reach: Yes.   Sharps container removed or out of patient reach: Yes.     BEHAVIORAL HEALTH ROUNDING Patient sleeping: No. Patient alert and oriented: yes Behavior appropriate: Yes.  ; If no, describe:  Nutrition and fluids offered: Yes  Toileting and hygiene offered: Yes  Sitter present: yes Law enforcement present: Yes    
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
## 2014-06-15 NOTE — ED Notes (Signed)
Pt refuses to remove jewelry of clothes

## 2014-06-15 NOTE — ED Notes (Signed)
States self cut with razor blade 2 days ago, superficial cuts noted R forearm, states has history of self cutting denies SI, states [facility] is a nursing home he lives in and sent him for evaluation.

## 2014-06-15 NOTE — ED Notes (Signed)
BEHAVIORAL HEALTH ROUNDING Patient sleeping: No. Patient alert and oriented: yes Behavior appropriate: Yes.  ; If no, describe:  Nutrition and fluids offered: Yes  Toileting and hygiene offered: Yes  Sitter present: yes Law enforcement present: Yes  

## 2014-07-23 ENCOUNTER — Ambulatory Visit: Payer: Medicare Other | Admitting: Orthopedic Surgery

## 2014-07-24 ENCOUNTER — Encounter: Payer: Self-pay | Admitting: Orthopedic Surgery

## 2015-12-05 IMAGING — CR RIGHT HIP - COMPLETE 2+ VIEW
1 series · 3 of 3 positions shown · non-contrast
Comparison: 04/17/2014

CLINICAL DATA: Right hip pain post fall getting out of shower this
morning

EXAM:
RIGHT HIP (WITH PELVIS) 2-3 VIEWS

[Series 1: dxr hip right complete · 0.14mm/px · 3 of 3 slices shown]
[im 1/3]
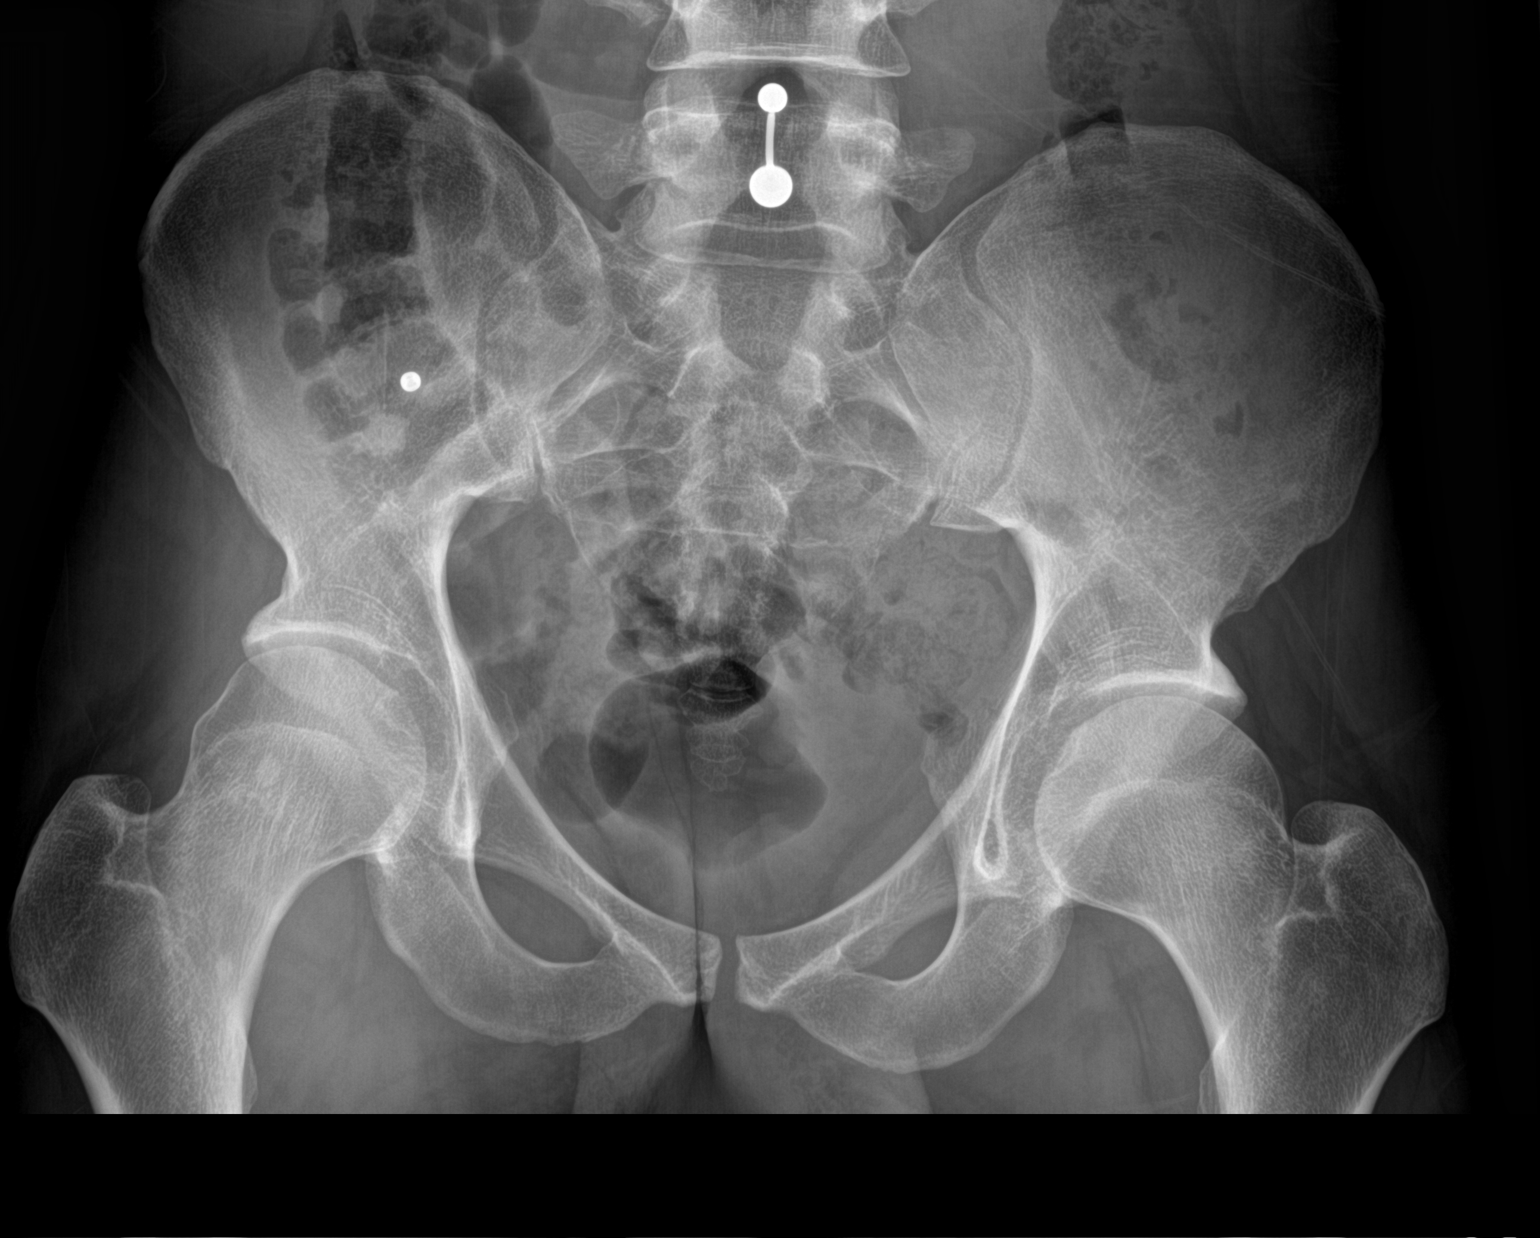
[im 2/3]
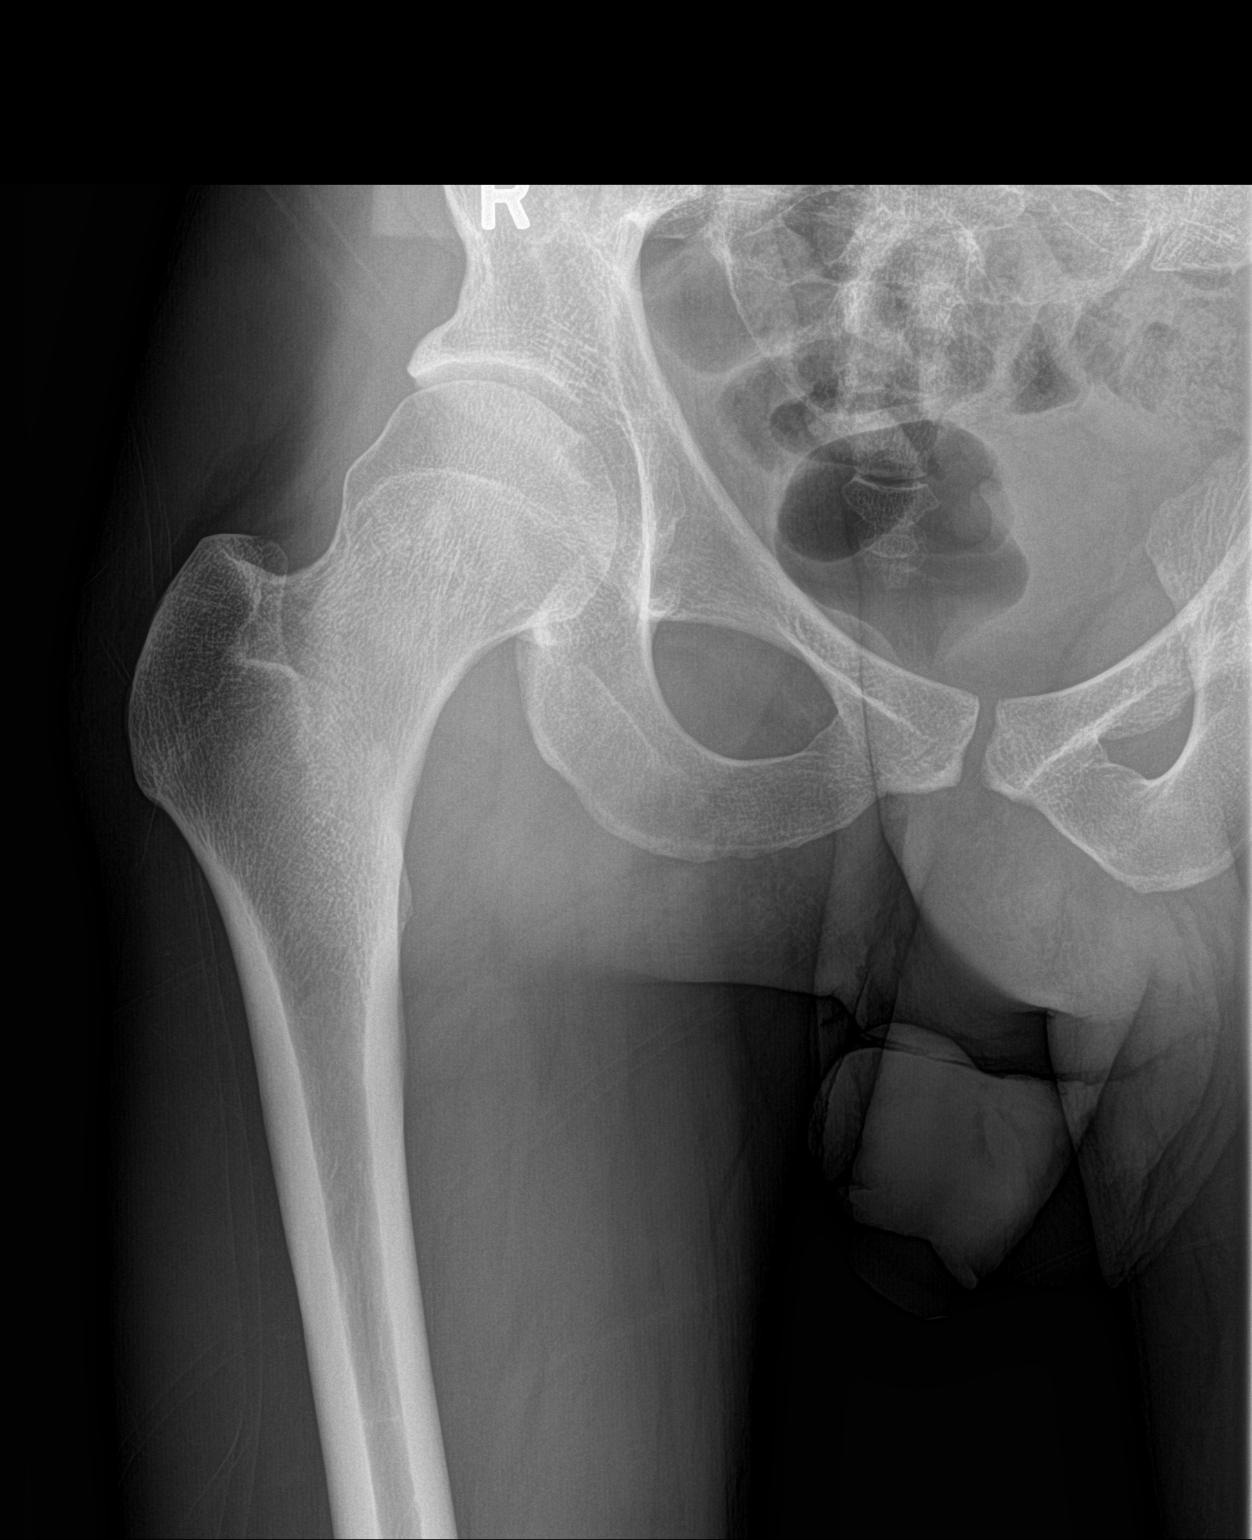
[im 3/3]
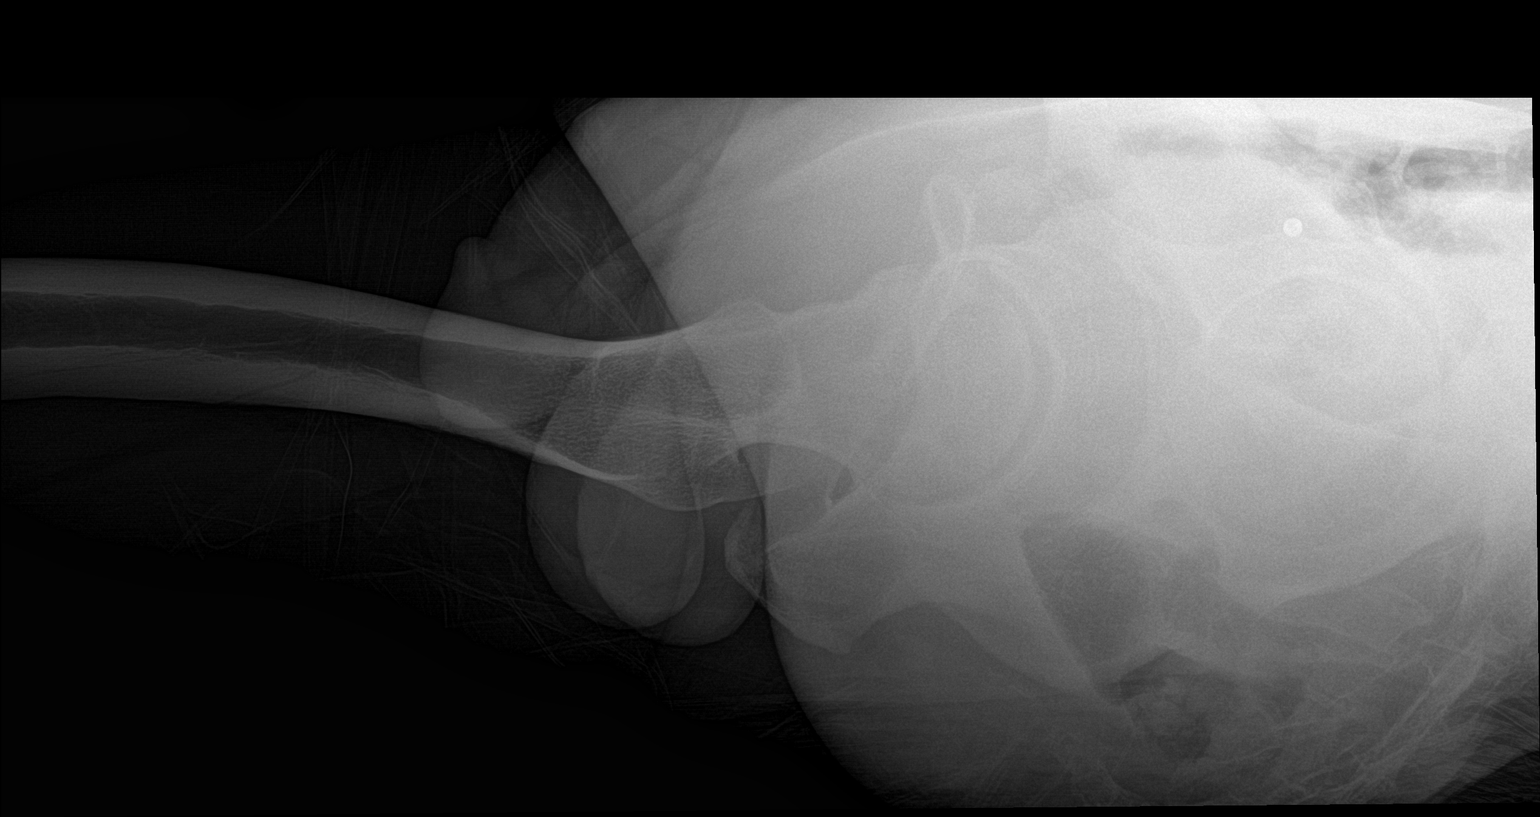

[3 of 3 positions shown; findings below may reference images not displayed]

FINDINGS: Three views of right hip submitted. No acute fracture or
subluxation. No radiopaque foreign body.
IMPRESSION: Negative.
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
## 2015-12-08 IMAGING — DX DG HIP (WITH OR WITHOUT PELVIS) 2-3V*R*
3 series · 3 of 3 positions shown · non-contrast
Comparison: Plain films right hip 04/24/2014.

CLINICAL DATA: The patient tripped today. Right hip pain. Initial
encounter.

EXAM:
RIGHT HIP (WITH PELVIS) 2-3 VIEWS

[pelvis ap]
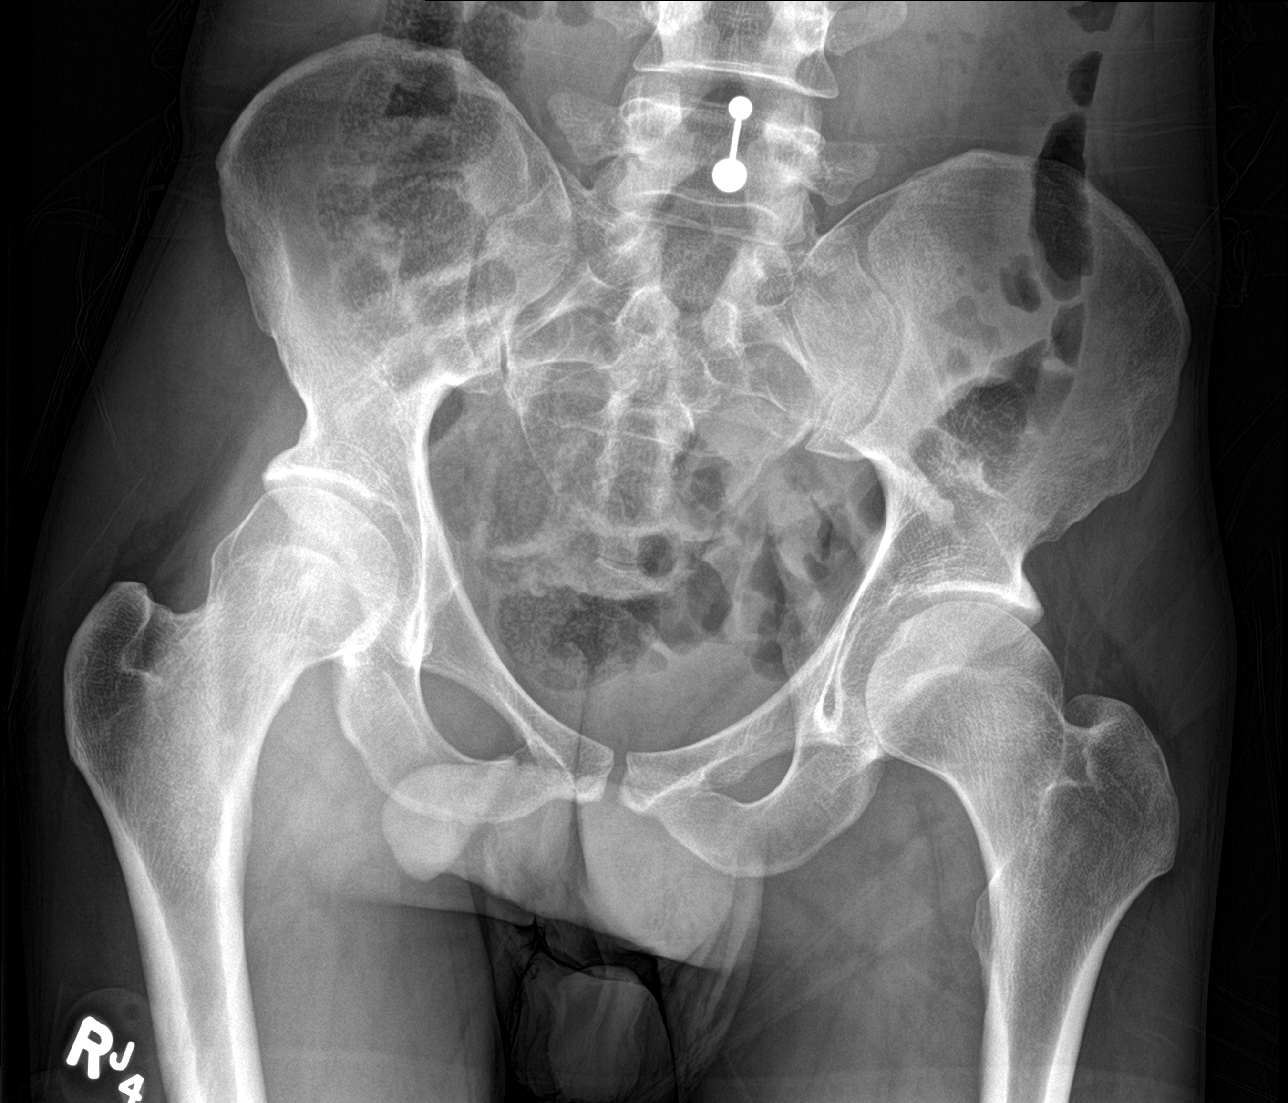

[hip ap]
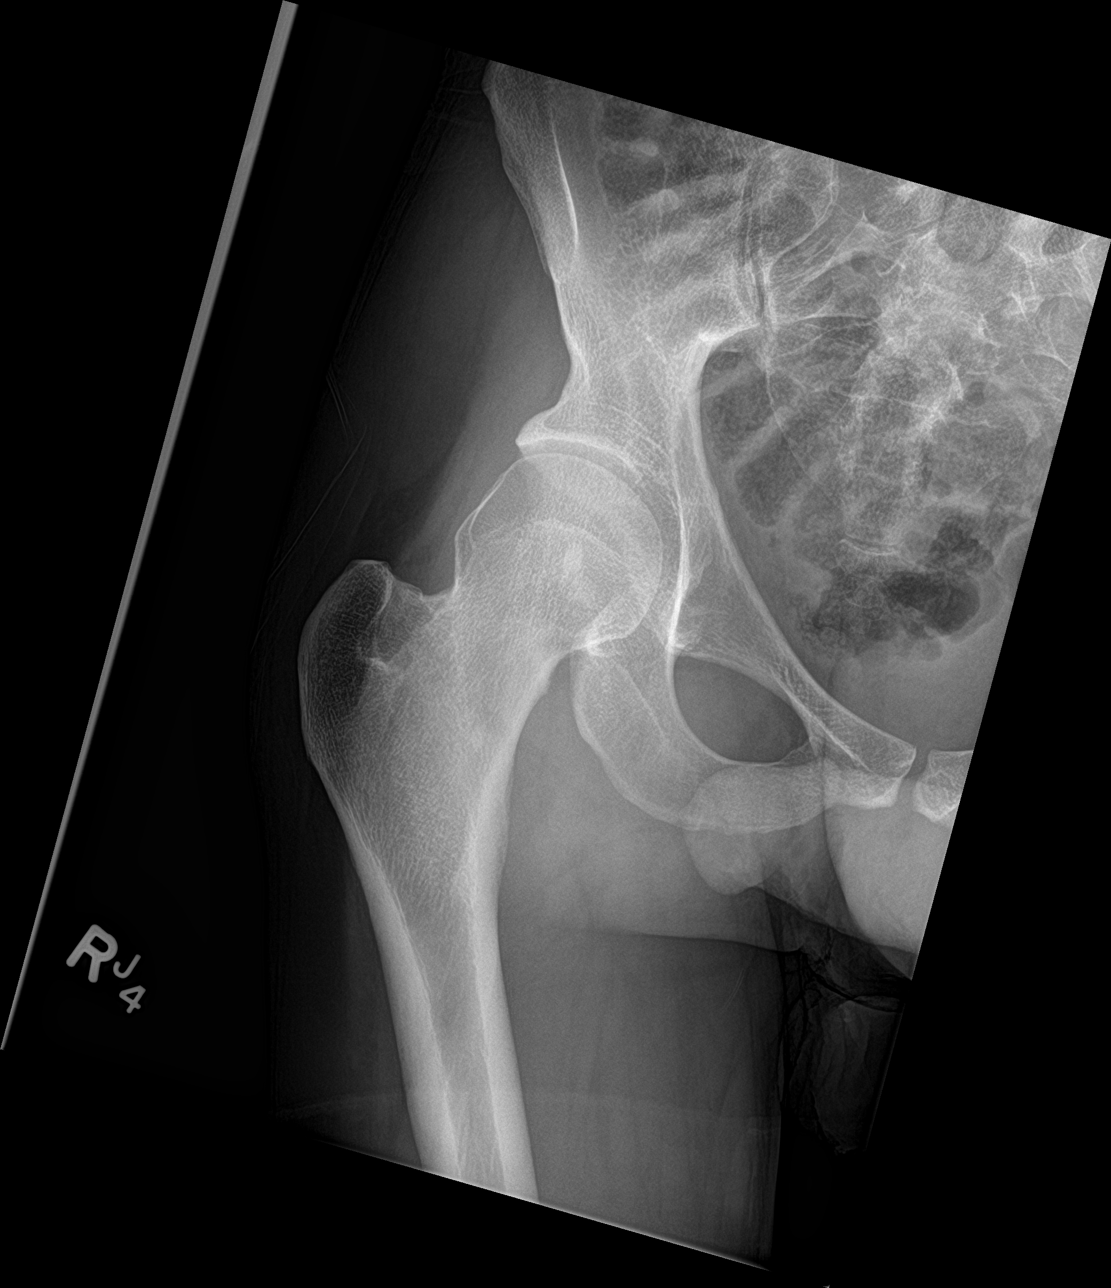

[hip lat]
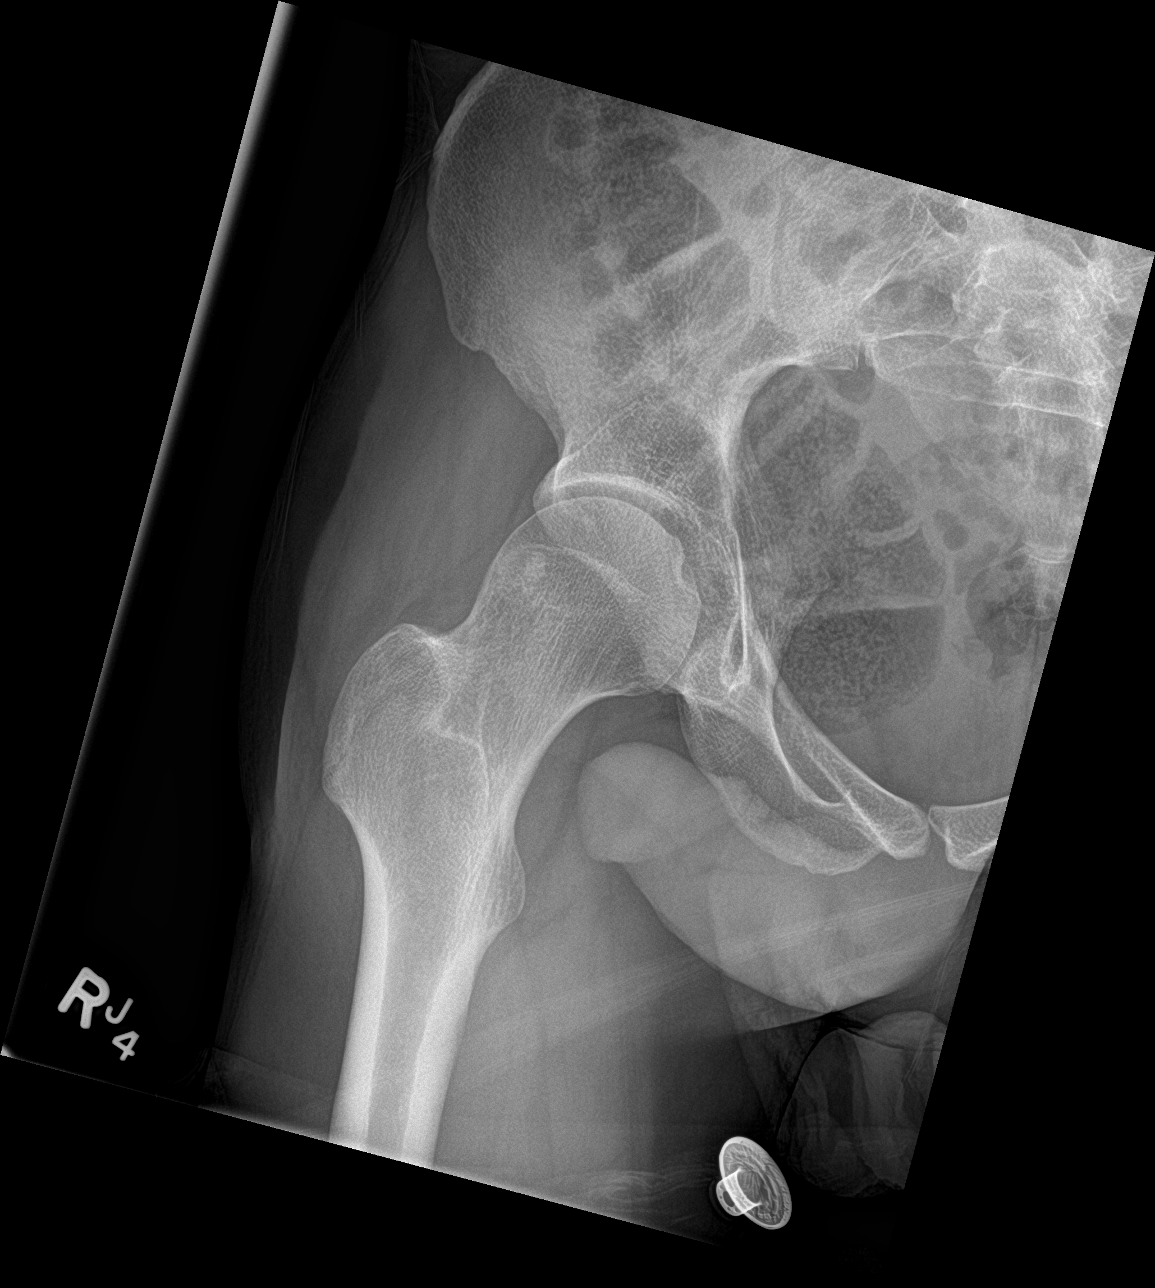

[3 of 3 positions shown; findings below may reference images not displayed]

FINDINGS: Mildly dysplastic hips are again seen. There is no acute bony or
joint abnormality. Spinal dysraphism is noted.
IMPRESSION: No acute abnormality.

## 2016-07-02 ENCOUNTER — Inpatient Hospital Stay: Admit: 2016-07-02 | Primary: Adolescent Medicine

## 2016-07-03 LAB — HIV 1/2 ANTIGEN/ANTIBODY, FOURTH GENERATION W/RFL: Interpretation: NONREACTIVE

## 2016-07-03 LAB — HIV 1/2 AG/AB, 4TH GENERATION,W RFLX CONFIRM: HIV 1/2 Interpretation: NONREACTIVE

## 2016-07-07 LAB — MISC. LAB TEST

## 2016-10-15 ENCOUNTER — Ambulatory Visit
Admit: 2016-10-15 | Discharge: 2016-10-15 | Payer: PRIVATE HEALTH INSURANCE | Attending: Family | Primary: Adolescent Medicine

## 2016-10-15 DIAGNOSIS — W108XXA Fall (on) (from) other stairs and steps, initial encounter: Secondary | ICD-10-CM

## 2016-10-15 MED ORDER — OXYCODONE-ACETAMINOPHEN 5 MG-325 MG TAB
5-325 mg | ORAL_TABLET | Freq: Four times a day (QID) | ORAL | 0 refills | Status: DC | PRN
Start: 2016-10-15 — End: 2016-10-27

## 2016-10-15 MED ORDER — CYCLOBENZAPRINE 10 MG TAB
10 mg | ORAL_TABLET | Freq: Three times a day (TID) | ORAL | 0 refills | Status: DC | PRN
Start: 2016-10-15 — End: 2016-11-01
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
## 2016-10-15 NOTE — Patient Instructions (Signed)
Stopping Smoking: Care Instructions  Your Care Instructions  Cigarette smokers crave the nicotine in cigarettes. Giving it up is much harder than simply changing a habit. Your body has to stop craving the nicotine. It is hard to quit, but you can do it. There are many tools that people use to quit smoking. You may find that combining tools works best for you.  There are several steps to quitting. First you get ready to quit. Then you get support to help you. After that, you learn new skills and behaviors to become a nonsmoker. For many people, a necessary step is getting and using medicine.  Your doctor will help you set up the plan that best meets your needs. You may want to attend a smoking cessation program to help you quit smoking. When you choose a program, look for one that has proven success. Ask your doctor for ideas. You will greatly increase your chances of success if you take medicine as well as get counseling or join a cessation program.  Some of the changes you feel when you first quit tobacco are uncomfortable. Your body will miss the nicotine at first, and you may feel short-tempered and grumpy. You may have trouble sleeping or concentrating. Medicine can help you deal with these symptoms. You may struggle with changing your smoking habits and rituals. The last step is the tricky one: Be prepared for the smoking urge to continue for a time. This is a lot to deal with, but keep at it. You will feel better.  Follow-up care is a key part of your treatment and safety. Be sure to make and go to all appointments, and call your doctor if you are having problems. It's also a good idea to know your test results and keep a list of the medicines you take.  How can you care for yourself at home?  ?? Ask your family, friends, and coworkers for support. You have a better chance of quitting if you have help and support.  ?? Join a support group, such as Nicotine Anonymous, for people who are  trying to quit smoking.  ?? Consider signing up for a smoking cessation program, such as the American Lung Association's Freedom from Smoking program.  ?? Get text messaging support. Go to the website at www.smokefree.gov to sign up for the SmokefreeTXT program.  ?? Set a quit date. Pick your date carefully so that it is not right in the middle of a big deadline or stressful time. Once you quit, do not even take a puff. Get rid of all ashtrays and lighters after your last cigarette. Clean your house and your clothes so that they do not smell of smoke.  ?? Learn how to be a nonsmoker. Think about ways you can avoid those things that make you reach for a cigarette.  ?? Avoid situations that put you at greatest risk for smoking. For some people, it is hard to have a drink with friends without smoking. For others, they might skip a coffee break with coworkers who smoke.  ?? Change your daily routine. Take a different route to work or eat a meal in a different place.  ?? Cut down on stress. Calm yourself or release tension by doing an activity you enjoy, such as reading a book, taking a hot bath, or gardening.  ?? Talk to your doctor or pharmacist about nicotine replacement therapy, which replaces the nicotine in your body. You still get nicotine but you do not use tobacco.   Nicotine replacement products help you slowly reduce the amount of nicotine you need. These products come in several forms, many of them available over-the-counter:  ?? Nicotine patches  ?? Nicotine gum and lozenges  ?? Nicotine inhaler  ?? Ask your doctor about bupropion (Wellbutrin) or varenicline (Chantix), which are prescription medicines. They do not contain nicotine. They help you by reducing withdrawal symptoms, such as stress and anxiety.  ?? Some people find hypnosis, acupuncture, and massage helpful for ending the smoking habit.  ?? Eat a healthy diet and get regular exercise. Having healthy habits will help your body move past its craving for nicotine.   ?? Be prepared to keep trying. Most people are not successful the first few times they try to quit. Do not get mad at yourself if you smoke again. Make a list of things you learned and think about when you want to try again, such as next week, next month, or next year.  Where can you learn more?  Go to http://www.healthwise.net/GoodHelpConnections.  Enter Y522 in the search box to learn more about "Stopping Smoking: Care Instructions."  Current as of: December 17, 2015  Content Version: 11.7  ?? 2006-2018 [employer]. Care instructions adapted under license by [employer] (which disclaims liability or warranty for this information). If you have questions about a medical condition or this instruction, always ask your healthcare professional. Healthwise, Incorporated disclaims any warranty or liability for your use of this information.

## 2016-10-25 ENCOUNTER — Inpatient Hospital Stay: Payer: MEDICARE | Primary: Adolescent Medicine

## 2016-10-26 ENCOUNTER — Encounter: Attending: Family | Primary: Adolescent Medicine

## 2016-10-27 ENCOUNTER — Inpatient Hospital Stay: Admit: 2016-10-27 | Payer: MEDICARE | Primary: Adolescent Medicine

## 2016-10-27 ENCOUNTER — Inpatient Hospital Stay: Admit: 2016-10-27 | Payer: MEDICARE | Attending: Family | Primary: Adolescent Medicine

## 2016-10-27 ENCOUNTER — Ambulatory Visit: Admit: 2016-10-27 | Discharge: 2016-10-27 | Payer: MEDICARE | Attending: Family | Primary: Adolescent Medicine

## 2016-10-27 ENCOUNTER — Encounter

## 2016-10-27 DIAGNOSIS — W108XXA Fall (on) (from) other stairs and steps, initial encounter: Secondary | ICD-10-CM

## 2016-10-27 DIAGNOSIS — M25451 Effusion, right hip: Secondary | ICD-10-CM

## 2016-10-27 MED ORDER — OXYCODONE-ACETAMINOPHEN 5 MG-325 MG TAB
5-325 mg | ORAL_TABLET | Freq: Four times a day (QID) | ORAL | 0 refills | Status: DC | PRN
Start: 2016-10-27 — End: 2016-11-01

## 2016-10-27 NOTE — Progress Notes (Signed)
Chief Complaint   Patient presents with   ??? Fall     patient fell down 16 steps on Saturday did not go to the hospital.  patient states that her case worker made her make the appointment

## 2016-10-27 NOTE — Progress Notes (Signed)
Please call patient to inform her (prefers to be called Ravyn) the x-ray is normal and the ultrasound showed swelling of the soft tissue.  Elevate area as best as possible and ice packs X 20 minutes qid minimum to reduce swelling. If no improvement noted in 48-72 hours or if area becomes more swollen then contact office and schedule follow up appointment with Dr. Magnus Ivan next week. Will hold off on antibiotics unless develop fever/chills/drainage. Monitor for signs of infection. Thanks

## 2016-10-27 NOTE — Progress Notes (Signed)
This note will not be viewable in MyChart.      Rodney Arnold is a  31 y.o. male presents for visit.  Patient is transgender, prefers to be called by her name Ravyn and referred to using male pronouns.    Chief Complaint   Patient presents with   ??? Fall     patient fell down 16 steps on Saturday did not go to the hospital.  patient states that her case worker made her make the appointment     HPI  Patient presents for initial evaluation after falling down a flight of stairs at home 4 days ago.  She carries the diagnosis of cerebral palsy, gait is unsteady at baseline.  Has a history of falls.  Reports she was at the top of her wooden staircase and tripped on her cat.  Fell down 16 steps and landed on her right hip.  After 15 minutes on the ground at the bottom of the steps she was able to drag herself to a chair and called her sister who lives in [place] who drove up to help out.  Patient reports exquisite right hip pain and increased difficulty with ambulation.  Has been taking 1200 mg of ibuprofen every 8 hours which has not been effective controlling the pain.  She has a large swollen area at the site of impact.  Area is warm and extremely tender to touch.  Presents with her caseworker who was in the waiting area today.    Review of Systems   Constitutional: Negative for chills and fever.   Cardiovascular: Negative for chest pain and palpitations.   Musculoskeletal: Positive for falls and joint pain.   Neurological: Positive for weakness.          Visit Vitals   ??? BP 126/81 (BP 1 Location: Left arm, BP Patient Position: Sitting)   ??? Pulse 77   ??? Temp 98 ??F (36.7 ??C) (Oral)   ??? Resp 16   ??? Ht  (1.651 m)   ??? Wt 113 lb 3.2 oz (51.3 kg)   ??? SpO2 97%   ??? BMI 18.84 kg/m2     Physical Exam   Constitutional: He is oriented to person, place, and time.   Thin   HENT:   Head: Normocephalic and atraumatic.   Eyes: Conjunctivae are normal.   Cardiovascular: Normal rate, regular rhythm and normal heart sounds.     Pulmonary/Chest: Effort normal and breath sounds normal.   Musculoskeletal:        Right hip: He exhibits decreased range of motion, decreased strength, tenderness and swelling.   Large right hip effusion. Positive erythema, edema, warmth, tenderness.   Neurological: He is alert and oriented to person, place, and time. He exhibits abnormal muscle tone. Gait abnormal.   Difficulty with ambulation.   Skin: He is not diaphoretic.   Psychiatric: He has a normal mood and affect. His behavior is normal.   Nursing note and vitals reviewed.        Patient Active Problem List    Diagnosis Date Noted   ??? 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 Family history of colon cancer in father 10/15/2016   ??? FH: breast cancer in relative when <73 years old 10/15/2016         ASSESSMENT AND PLAN:      ICD-10-CM ICD-9-CM   1. Fall (on) (from) other stairs and steps, initial encounter W10.8XXA E880.9   2. Hip effusion, right M25.451 719.05  3. Hip pain, acute, right M25.551 719.45   4. Spastic diplegic cerebral palsy (HCC) G80.1 343.0     Orders Placed This Encounter   ??? XR HIP RT W WO PELV MIN 4 VWS     Standing Status:   Future     Number of Occurrences:   1     Standing Expiration Date:   10/28/2017     Order Specific Question:   Reason for Exam     Answer:   right hip trauma/effusion/pain s/p fall X 16 stairs.   ??? [place] EXT NONVAS RT LTD     Standing Status:   Future     Number of Occurrences:   1     Standing Expiration Date:   11/27/2017     Order Specific Question:   Specific Body Part     Answer:   hip right   ??? oxyCODONE-acetaminophen (PERCOCET) 5-325 mg per tablet     Sig: Take 1 Tab by mouth every six (6) hours as needed for Pain. Max Daily Amount: 4 Tabs.     Dispense:  15 Tab     Refill:  0     Diagnoses and all orders for this visit:    1. Fall (on) (from) other stairs and steps, initial encounter  -     XR HIP RT W WO PELV MIN 4 VWS; Future  -     [place] EXT NONVAS RT LTD; Future    2. Hip effusion, right   -     XR HIP RT W WO PELV MIN 4 VWS; Future  -     [place] EXT NONVAS RT LTD; Future    3. Hip pain, acute, right  -     XR HIP RT W WO PELV MIN 4 VWS; Future  -     [place] EXT NONVAS RT LTD; Future  -     oxyCODONE-acetaminophen (PERCOCET) 5-325 mg per tablet; Take 1 Tab by mouth every six (6) hours as needed for Pain. Max Daily Amount: 4 Tabs.    4. Spastic diplegic cerebral palsy (HCC)  -     XR HIP RT W WO PELV MIN 4 VWS; Future  -     [place] EXT NONVAS RT LTD; Future        radiology results and schedule of future radiology studies reviewed with patient  Will follow up with patient this afternoon after imaging studies completed.    Consulted with Dr. Magnus Ivan who agrees with plan.     Follow-up Disposition:  Return in about 2 days (around 10/29/2016), or if symptoms worsen or fail to improve.      Disclaimer:  Advised him to call back or return to office if symptoms worsen/change/persist.  Discussed expected course/resolution/complications of diagnosis in detail with patient.    Medication risks/benefits/alternatives discussed with patient.  He was given an after visit summary which includes diagnoses, current medications, & vitals.     Discussed patient instructions and advised to read to all patient instructions regarding care.     He expressed understanding with the diagnosis and plan.

## 2016-10-28 NOTE — Progress Notes (Signed)
Confirmed patient id and informed of results.  Gave recommendations and patient has elevated and is putting ice on the area.  Gave verbal understanding that she will come in and see DR Magnus Ivan next week if there is not any improvement.

## 2016-10-29 ENCOUNTER — Encounter

## 2016-10-29 MED ORDER — KETOROLAC TROMETHAMINE 10 MG TAB
10 mg | ORAL_TABLET | Freq: Three times a day (TID) | ORAL | 0 refills | Status: DC | PRN
Start: 2016-10-29 — End: 2016-12-26

## 2016-10-29 NOTE — Telephone Encounter (Signed)
Confirmed patient id, patient states that he is taking the medication prescribed for pain and it is not helping. Patient states that the pain is an 8    Patient return number is [phone]

## 2016-10-29 NOTE — Telephone Encounter (Signed)
Advised that patient medication has been sent to the pharmacy and to take in addition to the percocet. But not to take any nsaids or ibuprophen with this.  Patient gave verbal understanding.

## 2016-10-29 NOTE — Telephone Encounter (Signed)
Please inform patient I sent in a new prescription for ketorolac 10 mg p.o. every 8 hours as needed severe pain.  She can take this in addition to the Percocet.  I sent in a prescription for 8 tablets.  Do not take ibuprofen or NSAIDs with Toradol.  She will need to make a follow-up appointment next week if symptoms persist or do not improve.  Thanks

## 2016-10-29 NOTE — Telephone Encounter (Signed)
PT. Called to speak to Rodney Arnold and may be reached @ [phone]      Pt. Called less then 10 min's again to speak to [name].    Please call the pt. Back ASAP.

## 2016-11-01 ENCOUNTER — Ambulatory Visit
Admit: 2016-11-01 | Discharge: 2016-11-01 | Payer: MEDICARE | Attending: Internal Medicine | Primary: Adolescent Medicine

## 2016-11-01 DIAGNOSIS — G8929 Other chronic pain: Secondary | ICD-10-CM

## 2016-11-01 MED ORDER — OXYCODONE-ACETAMINOPHEN 5 MG-325 MG TAB
5-325 mg | ORAL_TABLET | ORAL | 0 refills | Status: DC | PRN
Start: 2016-11-01 — End: 2016-11-22

## 2016-11-01 MED ORDER — CYCLOBENZAPRINE 10 MG TAB
10 mg | ORAL_TABLET | Freq: Every evening | ORAL | 0 refills | Status: AC
Start: 2016-11-01 — End: 2016-12-01

## 2016-11-01 MED ORDER — PREDNISONE 20 MG TAB
20 mg | ORAL_TABLET | ORAL | 0 refills | Status: DC
Start: 2016-11-01 — End: 2016-12-26

## 2016-11-01 NOTE — Progress Notes (Signed)
Visit Vitals   ??? BP 142/89 (BP 1 Location: Left arm, BP Patient Position: Sitting)   ??? Pulse (!) 108   ??? Temp 98.4 ??F (36.9 ??C) (Oral)   ??? Resp 14   ??? Ht  (1.651 m)   ??? Wt 109 lb 12.8 oz (49.8 kg)   ??? SpO2 97%   ??? BMI 18.27 kg/m2     Chief Complaint   Patient presents with   ??? Fall     Pain s/p fall 2 weeks ago      Patient states that they fell moments ago in the downstairs lobby. Positive for some ecchymosis on the right hip and thigh. States toradol is ineffective for pain.     1. Have you been to the ER, urgent care clinic since your last visit?  Hospitalized since your last visit? Denies     2. Have you seen or consulted any other health care providers outside of the [facility] since your last visit?  Include any pap smears or colon screening. Denies

## 2016-11-01 NOTE — Progress Notes (Signed)
Written by San Jetty, Scribekick, as dictated by Dr. Vernia Buff, MD.    HISTORY OF PRESENT ILLNESS  Rodney Arnold is a 31 y.o. male.  HPI  The patient presents today after falling. Patient reports multiple instances of falling, including while walking into the office today. Today the patient landed on the R hip, which was injured by a previous fall. The patient was born with cerebral palsy and will be starting PT tomorrow. The patient had crutches as a child and hopes to get new crutches tomorrow. The patient has been experiencing R hip pain and saw [name] on 10/10. Hip [place] on 10/10 found: Subcutaneous edema and swelling in the soft tissues overlying the right hip, without evidence of a fluid collection. Hip XR on 10/10 found: No evidence of acute fracture. Patient notes that the hip is still swollen, painful, and bruised. Patient has been applying lots of ice to the area and notes taking ibuprofen daily for years to help with swelling. Patient was prescribed oxycodone and flexeril by [name], which provided significant relief. Patient has run out of oxycodone and flexeril.    Patient denies abdominal pain but notes allergies to roommate's cats.    Patient is taking spironolactone 25 mg as a testosterone blocker, which was prescribed by the [employer]. Patient notes that labs are checked by the [facility] every month. Patient is not taking Latuda anymore.    Followed by psychiatry. Compliant on Celexa 20 mg and dosage was decreased from 40 mg as the patient was experiencing less depression.    Patient denies drinking ETOH.    Patient Active Problem List   Diagnosis Code   ??? 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 Family history of colon cancer in father Z80.0   ??? FH: breast cancer in relative when <47 years old Z80.3   ??? Bipolar disorder in remission (HCC) F31.70        Current Outpatient Prescriptions on File Prior to Visit   Medication Sig Dispense Refill    ??? ketorolac (TORADOL) 10 mg tablet Take 1 Tab by mouth every eight (8) hours as needed for Pain. Indications: Severe Pain 8 Tab 0   ??? estradiol (ESTRACE) 2 mg tablet take 1 tablet by mouth once daily  0   ??? spironolactone (ALDACTONE) 25 mg tablet take 2 tablets by mouth twice a day  0   ??? citalopram (CELEXA) 20 mg tablet Take  by mouth daily.     ??? cyclobenzaprine (FLEXERIL) 10 mg tablet Take 1 Tab by mouth three (3) times daily as needed for Muscle Spasm(s). 30 Tab 0     No current facility-administered medications on file prior to visit.        Allergies   Allergen Reactions   ??? Codeine Anaphylaxis   ??? Penicillins Anaphylaxis   ??? Food Allergy Formula [Glutamine-C-Quercet-Selen-Brom] Swelling     Peanuts grapes and pineapples   ??? Tramadol Rash and Swelling       Past Medical History:   Diagnosis Date   ??? Asthma due to environmental allergies    ??? Cat allergies    ??? Cerebral palsy (HCC)        Past Surgical History:   Procedure Laterality Date   ??? HX ORTHOPAEDIC  1998    quadricep lengthening surgery       Family History   Problem Relation Age of Onset   ??? Stroke Mother    ??? Psychiatric Disorder Mother    ???  Breast Cancer Mother    ??? Alcohol abuse Mother    ??? Hypertension Mother    ??? Colon Cancer Father    ??? Alcohol abuse Father    ??? Heart Disease Father    ??? Hypertension Father    ??? Diabetes Father    ??? Heart Failure Father    ??? Psychiatric Disorder Father    ??? Prostate Cancer Paternal Uncle        Social History     Social History   ??? Marital status: SINGLE     Spouse name: N/A   ??? Number of children: N/A   ??? Years of education: N/A     Occupational History   ??? Not on file.     Social History Main Topics   ??? Smoking status: Current Every Day Smoker   ??? Smokeless tobacco: Never Used   ??? Alcohol use Yes      Comment: very rarely   ??? Drug use: No   ??? Sexual activity: Not Currently     Other Topics Concern   ??? Not on file     Social History Narrative       Review of Systems    Respiratory: Negative for cough and shortness of breath.    Gastrointestinal: Negative for abdominal pain and heartburn.   Musculoskeletal: Positive for falls and joint pain. Negative for myalgias.        R hip pain   Neurological: Negative for dizziness, tingling, sensory change and headaches.   Endo/Heme/Allergies: Positive for environmental allergies.   Psychiatric/Behavioral: Positive for depression. Negative for memory loss and substance abuse.     Visit Vitals   ??? BP 142/89 (BP 1 Location: Left arm, BP Patient Position: Sitting)   ??? Pulse (!) 108   ??? Temp 98.4 ??F (36.9 ??C) (Oral)   ??? Resp 14   ??? Ht  (1.651 m)   ??? Wt 109 lb 12.8 oz (49.8 kg)   ??? SpO2 97%   ??? BMI 18.27 kg/m2       Physical Exam   Constitutional: He is oriented to person, place, and time. He appears well-developed and well-nourished. No distress.   HENT:   Right Ear: External ear normal.   Left Ear: External ear normal.   Eyes: Conjunctivae and EOM are normal. Right eye exhibits no discharge. Left eye exhibits no discharge.   Neck: Normal range of motion. Neck supple.   Cardiovascular: Normal rate and regular rhythm.    Pulmonary/Chest: Effort normal and breath sounds normal. He has no wheezes.   Abdominal: Soft. Bowel sounds are normal. There is no tenderness.   Musculoskeletal:   Soft tissue swelling around R hip, tender to touch   Lymphadenopathy:     He has no cervical adenopathy.   Neurological: He is alert and oriented to person, place, and time.   Skin: He is not diaphoretic.   Bruise over R hip   Psychiatric: He has a normal mood and affect. His behavior is normal.   Nursing note and vitals reviewed.      ASSESSMENT and PLAN    ICD-10-CM ICD-9-CM    1. Chronic right hip pain M25.551 719.45 predniSONE (DELTASONE) 20 mg tablet sent to pharmacy.    G89.29 338.29 oxyCODONE-acetaminophen (PERCOCET) 5-325 mg per tablet script given to patient.    Prednisone 20 mg prescribed. I want the patient to take the medication po  as follows: 3 pills x 2 days, 2 pills x 2 days,  1 pill x 1 day and 1/2 pill x 1 day. The patient was advised to take this medication with food. Patient should not take  ibuprofen while taking steroids. Percocet 5-325 mg prescribed, which should be taken as needed only. Patient should go to PT as per previous plans.   2. 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    Flexeril prescribed, which should be taken at night.   3. Bipolar disorder in remission (HCC) F31.70 296.80 Followed by psychiatry. Compliant on Celexa 20 mg.   Patient should take OTC Zyrtec or Allegra for allergies.    This plan was reviewed with the patient and patient agrees. All questions were answered.    This scribe documentation was reviewed by me and accurately reflects the examination and decisions made by me.    This note will not be viewable in MyChart.

## 2016-11-02 ENCOUNTER — Inpatient Hospital Stay: Admit: 2016-11-02 | Payer: MEDICARE | Primary: Adolescent Medicine

## 2016-11-02 DIAGNOSIS — M25551 Pain in right hip: Secondary | ICD-10-CM

## 2016-11-02 NOTE — Progress Notes (Signed)
[facility]  [address], [address]  [place], [place] [id number]  Phone: [phone]  Fax: [phone]    Plan of Care/Statement of Necessity for Physical Therapy Services  2-15    Patient name: Rodney Arnold  DOB: 24-Oct-1985  Provider#: [id number]  Referral source: Lorri FrederickHarrington, Pamela G, NP      Medical/Treatment Diagnosis: Hip pain [M25.559]     Prior Hospitalization: see medical history     Comorbidities: spastic diplegic cerebral palsy, bipolar disorder   Prior Level of Function: chronic pain; unemployed  Medications: Verified on Patient Summary List    Start of Care: 11/02/2016     Onset Date: chronic       The Plan of Care and following information is based on the information from the initial evaluation.  Assessment/ key information: Pt is a 31 year old male (transgender, transitioning to male- prefers to be called Rodney Arnold and male pronouns) referred to Physical Therapy by Judithe ModestPamela Harrington, NP, with a diagnosis of pain of right hip joint due to fall and spastic diplegic cerebral palsy.  Pt has history of multiple falls throughout lifetime, but has had at least 4 falls in the past month which have caused significant right hip pain.  Right hip is warm and extremely tender to the touch.  Pt ambulates without AD, unsteady, ataxic gait with foot drop bilaterally and dragging left toes during swing phase.  Pt would benefit from AD for ambulation to maximize safety and decrease frequency of falling; however, pt is apprehensive.   Patient will benefit from skilled PT services recommendation of assistive device and/or orthotics (rollator, rolling walker, loft strand crutches vs left AFO) to modify and progress therapeutic interventions, address functional mobility deficits, address ROM deficits, address strength deficits, analyze and address soft tissue restrictions, analyze and cue movement patterns, analyze and modify body  mechanics/ergonomics, assess and modify postural abnormalities and address imbalance to attain pt/PT goals.    Evaluation Complexity History MEDIUM  Complexity : 1-2 comorbidities / personal factors will impact the outcome/ POC ; Examination HIGH Complexity : 4+ Standardized tests and measures addressing body structure, function, activity limitation and / or participation in recreation  ;Presentation MEDIUM Complexity : Evolving with changing characteristics  ;Clinical Decision Making MEDIUM Complexity; Overall Complexity Rating: MEDIUM    Problem List: pain affecting function, decrease ROM, decrease strength, edema affecting function, impaired gait/ balance, decrease ADL/ functional abilitiies, decrease activity tolerance, decrease flexibility/ joint mobility and decrease transfer abilities   Treatment Plan may include any combination of the following: Therapeutic activities, Neuromuscular re-education, Physical agent/modality, Gait/balance training, Manual therapy, Patient education, Functional mobility training and Stair training  Patient / Family readiness to learn indicated by: asking questions, trying to perform skills and interest  Persons(s) to be included in education: patient (P)  Barriers to Learning/Limitations: None  Patient Goal (s): ???Rehabilitate to somewhat normality.???  Patient Self Reported Health Status: good  Rehabilitation Potential: fair    Short/Long Term Goals: To be accomplished in 4 weeks:  1) Pt will be Independent with HEP.  2) Pt will be able to perform sit to/from transfer without increase in pain.  3) Pt will be able to ambulate greater than 2 blocks with least restrictive assistive device without increase of pain.  4) Pt will be able to navigate one flight of stairs without pain, safely.    Frequency / Duration: Patient to be seen 2 times per week for 4 weeks.  Patient/ Caregiver education and instruction: self care,  activity modification, brace/ splint application and exercises     [x]   Plan of care has been reviewed with PTA    Cherylann Ratel, PT, DPT  11/02/2016 11:15 AM    ________________________________________________________________________    I certify that the above Therapy Services are being furnished while the patient is under my care. I agree with the treatment plan and certify that this therapy is necessary.    Physician's Signature:____________________  Date:____________Time: _________

## 2016-11-02 NOTE — Progress Notes (Signed)
PT INITIAL EVALUATION NOTE - MCR 2-15    Patient Name: Rodney Arnold  Date:11/02/2016  DOB: Feb 05, 1985  [x]   Patient DOB Verified  Payor: [place] MEDICARE / Plan: [place] MEDICARE PART A & B / Product Type: Medicare /    In time:10:15am  Out time:11:15am  Total Treatment Time (min): 60  Total Timed Codes (min): 25  1:1 Treatment Time (MC only): --   Visit #: 1     Treatment Area: Hip pain [M25.559]    SUBJECTIVE  Pain Level (0-10 scale): 8/10  Any medication changes, allergies to medications, adverse drug reactions, diagnosis change, or new procedure performed?: []  No    [x]  Yes (see summary sheet for update)  Subjective:    Pt born with cerebral palsy.  Pt is transgender and prefers to be called "Rodney Arnold" and male pronouns.      Pt had tendon lengthening surgery for bilateral LEs at age 79 or 40, which was successful- recommended to have it done a 2nd time, at age 60, but pt did not.     Pt has history of frequent falls throughout his life; however, several recent falls have caused significant right hip pain and greater difficulty walking.    Primary complaint is right hip pain due to fall down steps, getting off [employer] bus on 10/04/2016- landed on right hip- taken by ambulance to [facility] ER- x-rays were negative.  Larey Seat down outside steps at apartment (about 12 metal steps) on 10/21/2016- landed on right side.  Functional limitations due to pain include walking, transfers, not able to sleep on right side.  Pt used to use loft strand crutches, but as a teenager, decided against using them.  Pt reports recently being released from prison- while there, was "forced" to use rollator for ambulation, which did help with stability but made the pt feel embarrassed.     PLOF: chronic pain, spastic diplegic CP  Mechanism of Injury: fall down steps  Previous Treatment/Compliance: pain medication, ice (makes pain worse)  PMHx/Surgical Hx: spastic diplegic cerebral palsy, bipolar disorder  Work Hx: unemployed   Living Situation: lives with friends in a second story apartment  Pt Goals: "Rehabilitate to somewhat normality."  Barriers: CP, chronic pain  Motivation: fair  Substance use: unknown  FABQ Score: not captured    OBJECTIVE/EXAMINATION  Posture:  Slouched posture with rounded shoulders, right LE crossed over left knee  Other Observations: bruising right hip; hole in toe of left shoe from dragging toes during swing phase of gait  Gait and Functional Mobility:  Bilateral foot drop, knees in flexion during swing phase, left toe drag, ataxic  Palpation: warmth, tenderness to palpation right hip    Right ROM:  AROM  PROM   Flexion  50, pain 85, pain   Abd  10, pain 15, pain   IR  15, pain 22, pain   ER  20, pain 23, pain    Joint Mobility Assessment: NT     Flexibility: tightness bilateral LEs- quads, hams and gastrocnemius/soleus muscles    LOWER QUARTER   MUSCLE STRENGTH  KEY       R  L  0 - No Contraction  Knee ext  4/5  4/5  1 - Trace            flex  4/5  4/5  2 - Poor   Hip ext   3+/5  3+/5  3 - Fair          flex   3/5  3/5  4 - Good         abd  3/5, pain 3/5  5 - Normal         add  5/5  5/5      Ankle DF  0-1/5  0-1/5                PF  1-2/5  1-2/5                INV  0/5  0/5                EV  0/5  0/5    Neurological: Reflexes / Sensations: constant numbness in bilateral feet  Special Tests: Ober: positive               H.S. SLR: positive        Piriformis Ext: NT    Long Sit: negative         Hip Scour: NT   Standing March Test: positive    Modality rationale: decrease pain and increase tissue extensibility to improve the patient???s ability to ambulate and perform functional activities safely   Min Type Additional Details    []  Estim: [] Att   [] Unatt        [] TENS instruct                  [] IFC  [] Premod   [] NMES                     [] Other:  [] w/US   [] w/ice   [] w/heat  Position:  Location:    []   Traction: []  Cervical       [] Lumbar                       []  Prone          [] Supine                        [] Intermittent   [] Continuous Lbs:  []  before manual  []  after manual  [] w/heat    []   Ultrasound: [] Continuous   []  Pulsed at:                           []   [] Location:  W/cm2:    []  Paraffin         Location:   [] w/heat   10 []   Ice     [x]   Heat  []   Ice massage Position: left side-lying with pillow between knees  Location: right hip at end of session    []   Laser  []   Other: Position:  Location:      []   Vasopneumatic Device Pressure:       []  lo []  med []  hi   Temperature:    [x]  Skin assessment post-treatment:  [x] intact [] redness- no adverse reaction    [] redness ??? adverse reaction:     10 min Therapeutic Exercise:  [x]  See flow sheet :   Rationale: increase ROM, increase strength and improve coordination to improve the patient???s ability to ambulate and perform functional activities with increased ease          15  With   []  TE   []  TA   []  neuro   []  other: Patient Education: [x]  Review HEP- pt given handout with exercises for HEP  []  Progressed/Changed HEP based on:   []   positioning   []  body mechanics   []  transfers   [x]  heat/ice application    [x]  other: discussed the benefit and recommendation to use assistive device for ambulation.      Other Objective/Functional Measures: FOTO: not captured    Pain Level (0-10 scale) post treatment: 6/10    ASSESSMENT/Changes in Function:   Pt is a 31 year old male (transgender, transitioning to male- prefers to be called Rodney Arnold and male pronouns) referred to Physical Therapy by Judithe ModestPamela Harrington, NP, with a diagnosis of pain of right hip joint due to fall and spastic diplegic cerebral palsy.  Pt has history of multiple falls throughout lifetime, but has had at least 4 falls in the past month which have caused significant right hip pain.  Right hip is warm and extremely tender to the touch.  Pt ambulates without AD, unsteady, ataxic gait with foot drop bilaterally and dragging left toes during swing phase.   Pt would benefit from AD for ambulation to maximize safety and decrease frequency of falling; however, pt is apprehensive.   Patient will benefit from skilled PT services recommendation of assistive device and/or orthotics (rollator, rolling walker, loft strand crutches vs left AFO) to modify and progress therapeutic interventions, address functional mobility deficits, address ROM deficits, address strength deficits, analyze and address soft tissue restrictions, analyze and cue movement patterns, analyze and modify body mechanics/ergonomics, assess and modify postural abnormalities and address imbalance to attain pt/PT goals.     [x]   See Plan of Care    Cherylann RatelKelly H Viktorya Arguijo, PT, DPT  11/02/2016  10:30 AM

## 2016-11-03 ENCOUNTER — Inpatient Hospital Stay: Payer: MEDICARE | Attending: Family | Primary: Adolescent Medicine

## 2016-11-03 ENCOUNTER — Encounter: Attending: Rehabilitative and Restorative Service Providers" | Primary: Adolescent Medicine

## 2016-11-08 ENCOUNTER — Encounter: Payer: MEDICARE | Primary: Adolescent Medicine

## 2016-11-08 NOTE — Telephone Encounter (Signed)
Rodney EndoKelly Boutchyard with PT with [employer], she would like an order for lost strand crutches, pt has been falling a lot, they need an order from the doctor for insurance to cover the cost. They would also like to discuss this either with Pam or her nurses.     [phone]

## 2016-11-09 ENCOUNTER — Inpatient Hospital Stay: Admit: 2016-11-09 | Payer: MEDICARE | Primary: Adolescent Medicine

## 2016-11-09 NOTE — Progress Notes (Signed)
PT DAILY TREATMENT NOTE - MCR 2-15    Patient Name: Odilon Cass  Date:11/09/2016  DOB: 1985/10/21  [x]   Patient DOB Verified  Payor: [place] MEDICARE / Plan: VA MEDICARE PART A & B / Product Type: Medicare /    In time:8:55A  Out time:9:45A  Total Treatment Time (min): 50  Total Timed Codes (min): 40  1:1 Treatment Time (MC only): --   Visit #: 2     Treatment Area: No admission diagnoses are documented for this encounter.    SUBJECTIVE  Pain Level (0-10 scale): 7/10  Any medication changes, allergies to medications, adverse drug reactions, diagnosis change, or new procedure performed?: [x]  No    []  Yes (see summary sheet for update)  Subjective functional status/changes:   []  No changes reported   pt reports feeling stiff this morning. Has been keeping up with HEP especially if she is sitting watching Netflix    OBJECTIVE    ??        Modality rationale: decrease pain and increase tissue extensibility to improve the patient???s ability to ambulate and perform functional activities safely   Min Type Additional Details   ?? []  Estim: [] Att   [] Unatt        [] TENS instruct                  [] IFC  [] Premod   [] NMES                     [] Other:  [] w/US   [] w/ice   [] w/heat  Position:  Location:   ?? []   Traction: []  Cervical       [] Lumbar                       []  Prone          [] Supine                       [] Intermittent   [] Continuous Lbs:  []  before manual  []  after manual  [] w/heat   ?? []   Ultrasound: [] Continuous   []  Pulsed at:                           []   [] Location:  W/cm2:   ?? []  Paraffin         Location:   [] w/heat   10 []   Ice     [x]   Heat  []   Ice massage Position: left side-lying with pillow between knees  Location: right hip at end of session   ?? []   Laser  []   Other: Position:  Location:   ??  ?? []   Vasopneumatic Device Pressure:       []  lo []  med []  hi   Temperature:    [x]  Skin assessment post-treatment:  [x] intact [] redness- no adverse reaction    [] redness ??? adverse reaction:        40 min Therapeutic Exercise:  [x]  See flow sheet :   Rationale: increase ROM, increase strength and improve coordination to improve the patient???s ability to increase core activation and stabilization.           With   []  TE   []  TA   []  neuro   []  other: Patient Education: [x]  Review HEP    []  Progressed/Changed HEP based on:   []  positioning   []  body mechanics   []   transfers   []  heat/ice application    []  other:      Other Objective/Functional Measures: --     Pain Level (0-10 scale) post treatment: 6/10    ASSESSMENT/Changes in Function:   Pt was challeneged with exercises today but able to perform them without pain. Pt advised pt that NP is going to submit an orderfor Loft strand crutches.     Patient will continue to benefit from skilled PT services to modify and progress therapeutic interventions, address functional mobility deficits, address ROM deficits, address strength deficits, analyze and address soft tissue restrictions, analyze and cue movement patterns, analyze and modify body mechanics/ergonomics and assess and modify postural abnormalities to attain remaining goals.     [x]   See Plan of Care  []   See progress note/recertification  []   See Discharge Summary         Progress towards goals / Updated goals:  Short/Long Term Goals: To be accomplished in 4 weeks:  1) Pt will be Independent with HEP.  2) Pt will be able to perform sit to/from transfer without increase in pain.  3) Pt will be able to ambulate greater than 2 blocks with least restrictive assistive device without increase of pain.  4) Pt will be able to navigate one flight of stairs without pain, safely.        PLAN  [x]   Upgrade activities as tolerated     [x]   Continue plan of care  []   Update interventions per flow sheet       []   Discharge due to:_  []   Other:_      Veneda MelterWhitney B Adea Geisel, PTA, OPTA 11/09/2016  8:52 AM

## 2016-11-09 NOTE — Telephone Encounter (Signed)
PT needs an order for [employer].

## 2016-11-09 NOTE — Telephone Encounter (Signed)
Hand written script ready.

## 2016-11-09 NOTE — Telephone Encounter (Signed)
Faxed script for [employer] to PT.

## 2016-11-10 ENCOUNTER — Inpatient Hospital Stay: Admit: 2016-11-10 | Payer: MEDICARE | Primary: Adolescent Medicine

## 2016-11-10 NOTE — Progress Notes (Addendum)
PT DAILY TREATMENT NOTE - MCR 2-15    Patient Name: Rodney Arnold  Date:11/10/2016  DOB: 25-Dec-1985  [x]   Patient DOB Verified  [place]or: [place] MED[place] / Plan: [place] MED[place]ARE PART A & B / Product Type: Medicare /    In time:12:20P Out time: 1:20P  Total Treatment Time (min): 60  Total Timed Codes (min): 50  1:1 Treatment Time (MC only): --   Visit #: 3     Treatment Area: Right hip pain [M25.551]    SUBJECTIVE  Pain Level (0-10 scale): 7/10  Any medication changes, allergies to medications, adverse drug reactions, diagnosis change, or new procedure performed?: [x]  No    []  Yes (see summary sheet for update)  Subjective functional status/changes:   []  No changes reported  Pt reported walking about 21 blocks yesterday after PT. Reports pain level is back up to what it was pre-treatment yesterday.    OBJECTIVE    ??        Modality rationale: decrease pain and increase tissue extensibility to improve the patient???s ability to ambulate and perform functional activities safely   Min Type Additional Details   ?? []  Estim: [] Att   [] Unatt        [] TENS instruct                  [] IFC  [] Premod   [] NMES                     [] Other:  [] w/US   [] w/ice   [] w/heat  Position:  Location:   ?? []   Traction: []  Cervical       [] Lumbar                       []  Prone          [] Supine                       [] Intermittent   [] Continuous Lbs:  []  before manual  []  after manual  [] w/heat   ?? []   Ultrasound: [] Continuous   []  Pulsed at:                           [] 1MHz   [] 3MHz Location:  W/cm2:   ?? []  Paraffin         Location:   [] w/heat   10 []   Ice     [x]   Heat  []   Ice massage Position: left side-lying with pillow between knees  Location: right hip at end of session   ?? []   Laser  []   Other: Position:  Location:   ??  ?? []   Vasopneumatic Device Pressure:       []  lo []  med []  hi   Temperature:    [x]  Skin assessment post-treatment:  [x] intact [] redness- no adverse reaction    [] redness ??? adverse reaction:        40 min Therapeutic Exercise:  [x]  See flow sheet :   Rationale: increase ROM, increase strength and improve coordination to improve the patient???s ability to increase core activation and stabilization.     10 min Neuromuscular Reeducation:  [x]  See flow sheet :   Rationale: increase ROM, increase strength and improve coordination to improve the patient???s ability to increase balance and stabilization needed for ambulation.               With   []   TE   []  TA   []  neuro   []  other: Patient Education: [x]  Review HEP    []  Progressed/Changed HEP based on:   []  positioning   []  body mechanics   []  transfers   []  heat/ice application    []  other:      Other Objective/Functional Measures: --     Pain Level (0-10 scale) post treatment: 5/10    ASSESSMENT/Changes in Function:   Pt challenged with balance activities reporting they felt weird. Pt stated that she has not really paid much attention to her balance. Pt reported some increase in R hip pain with bike. Pt given order from MD for Lofstrand crutches. Pt advised to contact insurance to be directed to a DME distributor for crutches. Pt able to maintain neutral with supine hip ABD today. Will assess bruising on hip next session.     Patient will continue to benefit from skilled PT services to modify and progress therapeutic interventions, address functional mobility deficits, address ROM deficits, address strength deficits, analyze and address soft tissue restrictions, analyze and cue movement patterns, analyze and modify body mechanics/ergonomics and assess and modify postural abnormalities to attain remaining goals.     [x]   See Plan of Care  []   See progress note/recertification  []   See Discharge Summary         Progress towards goals / Updated goals:  Short/Long Term Goals: To be accomplished in 4 weeks:  1) Pt will be Independent with HEP.  2) Pt will be able to perform sit to/from transfer without increase in pain.   3) Pt will be able to ambulate greater than 2 blocks with least restrictive assistive device without increase of pain.  4) Pt will be able to navigate one flight of stairs without pain, safely.        PLAN  [x]   Upgrade activities as tolerated     [x]   Continue plan of care  []   Update interventions per flow sheet       []   Discharge due to:_  []   Other:_      Veneda Melter, PTA, OPTA 11/10/2016  8:52 AM

## 2016-11-10 NOTE — Discharge Instructions (Signed)
[facility]  [address], [place], [place]  Phone: [phone]  Fax: [phone]    Discharge Summary  2-15    Patient name: Rodney Arnold  DOB: [birth year]  Provider#: [id number]  Referral source: Caryn Bee, NP      Medical/Treatment Diagnosis: Right hip pain [M25.551]     Prior Hospitalization: see medical history     Comorbidities: spastic diplegic cerebral palsy, bipolar disorder   Prior Level of Function:chronic pain; unemployed  Medications: Verified on Patient Summary List    Start of Care: 11/02/2016     Onset Date:chronic   Visits from Start of Care: 3     Missed Visits: 2  Reporting Period : 11/02/2016 to 11/10/2016      Progress towards goals / Updated goals:  Short/Long??Term Goals:??To be accomplished in 4??weeks:  1)??Pt will be Independent with HEP. NOT MET  2)??Pt will be able to perform sit to/from transfer without increase in pain. NOT MET  3) Pt will be able to??ambulate greater than??2??blocks with least restrictive assistive device??without increase of pain. NOT MET  4)??Pt will be able to??navigate one flight of stairs without pain, safely. NOT MET    ASSESSMENT/SUMMARY OF CARE: Pt participated in 3 outpatient PT sessions, 11/02/2016-11/10/2016, for pain of right hip joint due to fall and spastic diplegic cerebral palsy. Treatment included therapeutic exercise, neuromuscular re-education, pt education, moist hot pack and home exercise program. Recommended pt use loftstrand crutches to increase safety during ambulation. PT assisted pt with obtaining an order from MD, in order to bill DME through insurance; however, pt non-compliant. Pt failed to return to PT; therefore, discharged.    RECOMMENDATIONS:  '[x]' Discontinue therapy: '[]' Patient has reached or is progressing toward set goals      '[x]' Patient is non-compliant or has abdicated      '[]' Due to lack of appreciable progress towards set goals    Charlene Brooke, PT, DPT   03/31/2017

## 2016-11-12 ENCOUNTER — Ambulatory Visit: Admit: 2016-11-12 | Discharge: 2016-11-12 | Payer: MEDICARE | Attending: Family | Primary: Adolescent Medicine

## 2016-11-12 ENCOUNTER — Encounter: Attending: Family | Primary: Adolescent Medicine

## 2016-11-12 DIAGNOSIS — W19XXXD Unspecified fall, subsequent encounter: Secondary | ICD-10-CM

## 2016-11-12 MED ORDER — CYCLOBENZAPRINE 10 MG TAB
10 mg | ORAL_TABLET | Freq: Every evening | ORAL | 0 refills | Status: DC
Start: 2016-11-12 — End: 2016-12-26

## 2016-11-12 NOTE — Progress Notes (Signed)
Chief Complaint   Patient presents with   ??? Follow-up     follow up on fall from steps the ultra sound showed soft tissue swelling and no fracture.  pateint states that she is doing pt and it is getting better.

## 2016-11-12 NOTE — Progress Notes (Signed)
HISTORY OF PRESENT ILLNESS  Rodney Arnold is a 31 y.o. male, transgender, transitioning to male- prefers to be called Rodney Arnold and male pronouns.     HPI  Pt presents today for follow-up from a fall 10/6 still having pain to both legs. She finished percocet, flexeril from prior visit and is still taking flexeril which help her with muscle spasms and pain, but would like more pain medicine today to help with her day to day activities.     She carries the diagnosis of cerebral palsy, gait is unsteady at baseline.  Has a history of falls but no falls since original fall on 10-6. She states the bruise from fall was ultrasounded out of concern for hematoma but everything came back normal. She states the bruise has now completely healed and has no more pain related to the fall.   She started physical therapy for the last few weeks, going 3 times a week. The PT is mainly working on strength and balance. She is mainly having pain the days after PT, but resolves after moving and stretching with exercises provided by the PT. Will complete 6-8 weeks of PT. Flexeril is helping with muscle spasms. States she is still having chronic pain related to cerebral palsy.     Patient Active Problem List   Diagnosis Code   ??? 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 Family history of colon cancer in father Z80.0   ??? FH: breast cancer in relative when <9 years old Z80.3   ??? Bipolar disorder in remission (HCC) F31.70        Current Outpatient Medications on File Prior to Visit   Medication Sig Dispense Refill   ??? estradiol (ESTRACE) 2 mg tablet take 1 tablet by mouth once daily  0   ??? spironolactone (ALDACTONE) 25 mg tablet take 2 tablets by mouth twice a day  0   ??? citalopram (CELEXA) 20 mg tablet Take  by mouth daily.     ??? predniSONE (DELTASONE) 20 mg tablet Prednisone 60 mg po x 2 days,40 mg po x 2 days,20 mg po x 1 day,10 mg po x 1 day then stop. 12 Tab 0   ??? oxyCODONE-acetaminophen (PERCOCET) 5-325 mg per tablet Take 1 Tab by  mouth every four (4) hours as needed for Pain for up to 4 doses. Max Daily Amount: 6 Tabs. 4 Tab 0   ??? cyclobenzaprine (FLEXERIL) 10 mg tablet Take 1 Tab by mouth nightly for 30 days. 30 Tab 0   ??? ketorolac (TORADOL) 10 mg tablet Take 1 Tab by mouth every eight (8) hours as needed for Pain. Indications: Severe Pain 8 Tab 0     No current facility-administered medications on file prior to visit.        Allergies   Allergen Reactions   ??? Codeine Anaphylaxis   ??? Penicillins Anaphylaxis   ??? Food Allergy Formula [Glutamine-C-Quercet-Selen-Brom] Swelling     Peanuts grapes and pineapples   ??? Tramadol Rash and Swelling       Past Medical History:   Diagnosis Date   ??? Asthma due to environmental allergies    ??? Cat allergies    ??? Cerebral palsy (HCC)        Past Surgical History:   Procedure Laterality Date   ??? HX ORTHOPAEDIC  1998    quadricep lengthening surgery       Family History   Problem Relation Age of Onset   ??? Stroke Mother    ???  Psychiatric Disorder Mother    ??? Breast Cancer Mother    ??? Alcohol abuse Mother    ??? Hypertension Mother    ??? Colon Cancer Father    ??? Alcohol abuse Father    ??? Heart Disease Father    ??? Hypertension Father    ??? Diabetes Father    ??? Heart Failure Father    ??? Psychiatric Disorder Father    ??? Prostate Cancer Paternal Uncle        Social History     Socioeconomic History   ??? Marital status: SINGLE     Spouse name: Not on file   ??? Number of children: Not on file   ??? Years of education: Not on file   ??? Highest education level: Not on file   Social Needs   ??? Financial resource strain: Not on file   ??? Food insecurity - worry: Not on file   ??? Food insecurity - inability: Not on file   ??? Transportation needs - medical: Not on file   ??? Transportation needs - non-medical: Not on file   Occupational History   ??? Not on file   Tobacco Use   ??? Smoking status: Current Every Day Smoker   ??? Smokeless tobacco: Never Used   Substance and Sexual Activity   ??? Alcohol use: Yes     Comment: very rarely    ??? Drug use: No   ??? Sexual activity: Not Currently   Other Topics Concern   ??? Not on file   Social History Narrative   ??? Not on file            Review of Systems   Constitutional: Negative for fever and malaise/fatigue.   Gastrointestinal: Negative for abdominal pain, heartburn and nausea.   Musculoskeletal: Positive for joint pain and myalgias.   Neurological: Positive for sensory change. Negative for dizziness and focal weakness.       Physical Exam   Constitutional: He is oriented to person, place, and time.   Cachetic, thin.    Cardiovascular: Normal rate and regular rhythm.   Pulmonary/Chest: Effort normal and breath sounds normal.   Musculoskeletal:   Lower extremities atrophied. Walks with impaired gait.    Neurological: He is alert and oriented to person, place, and time.   Skin: Skin is warm and dry.   Psychiatric: He has a normal mood and affect.      Visit Vitals  BP 118/81 (BP 1 Location: Right arm, BP Patient Position: Sitting)   Pulse 92   Temp 98.2 ??F (36.8 ??C) (Oral)   Resp 16   Ht 5\' 5"  (1.651 m)   Wt 103 lb 9.6 oz (47 kg)   SpO2 99%   BMI 17.24 kg/m??         ASSESSMENT and PLAN    ICD-10-CM ICD-9-CM    1. Fall, subsequent encounter W19.XXXD V58.89 cyclobenzaprine (FLEXERIL) 10 mg tablet    Advised to continue PT as it helping patient. Hip bruised is completely healed from fall.      E888.9    2. Spastic diplegia (HCC) G80.1 343.0 cyclobenzaprine (FLEXERIL) 10 mg tablet    Continues to have chronic pain- flexeril is helping with muscle spasms.       REFERRAL TO PAIN MANAGEMENT    Patient seen & examined, agreed with above note ( physical exam & plan of care)    Disclaimer:  Advised patient to call back or return to office if symptoms worsen/change/persist.  Discussed expected course/resolution/complications of diagnosis in detail with patient.  ??  Medication risks/benefits/alternatives discussed with patient.  Patient was given an after visit summary which includes diagnoses, current  medications, & vitals.   ??  Discussed patient instructions and advised to read to all patient instructions regarding care.   ??  Patient expressed understanding with the diagnosis and plan.     This note will not be viewable in MyChart.

## 2016-11-15 ENCOUNTER — Encounter: Payer: MEDICARE | Primary: Adolescent Medicine

## 2016-11-17 ENCOUNTER — Inpatient Hospital Stay: Payer: MEDICARE | Primary: Adolescent Medicine

## 2016-11-22 ENCOUNTER — Encounter: Attending: Family | Primary: Adolescent Medicine

## 2016-11-22 ENCOUNTER — Ambulatory Visit: Admit: 2016-11-22 | Discharge: 2016-11-22 | Payer: MEDICARE | Attending: Family | Primary: Adolescent Medicine

## 2016-11-22 DIAGNOSIS — W19XXXA Unspecified fall, initial encounter: Secondary | ICD-10-CM

## 2016-11-22 MED ORDER — OXYCODONE-ACETAMINOPHEN 5 MG-325 MG TAB
5-325 mg | ORAL_TABLET | ORAL | 0 refills | Status: DC | PRN
Start: 2016-11-22 — End: 2016-12-26

## 2016-11-22 NOTE — Progress Notes (Addendum)
HISTORY OF PRESENT ILLNESS  Rodney Arnold is a 31 y.o. male.  HPI     Pt presents today from a fall, states she tripped over a stick on Saturday evening and landed on her right hip. She did not hear any pops, crack, and was able to walk home after work but had pain when she woke up Sunday and noticed it was swollen. She has a history of spastic diplegic cerebral palsy. She is In the process of getting lofstrand crutches to help with mobility as she has been having multiple falls. The crutches are supposed to come in the next two weeks.  She has been taking 800mg  ibuprofen and flexeril prescribed from previous visit. She has gotten an appointment with pain management scheduled for December 17th for chronic muscle/bone pain r/t her cerebral palsy.     She has been continuing with PT but states she is in too much pain to go this week. She is requesting something stronger than naproxen, tylenol, or muscle relaxer for pain.     Review of Systems   Constitutional:        Thin appearing, impaired gait.    HENT: Negative.    Respiratory: Negative.    Cardiovascular: Negative.    Musculoskeletal: Positive for falls, joint pain and myalgias.   Skin:        Multiple tattoos.    Neurological: Negative.    Psychiatric/Behavioral: Negative.      Patient Active Problem List   Diagnosis Code   ??? 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 Family history of colon cancer in father Z80.0   ??? FH: breast cancer in relative when <34 years old Z80.3   ??? Bipolar disorder in remission (HCC) F31.70        Current Outpatient Medications on File Prior to Visit   Medication Sig Dispense Refill   ??? cyclobenzaprine (FLEXERIL) 10 mg tablet Take 1 Tab by mouth nightly. 30 Tab 0   ??? predniSONE (DELTASONE) 20 mg tablet Prednisone 60 mg po x 2 days,40 mg po x 2 days,20 mg po x 1 day,10 mg po x 1 day then stop. 12 Tab 0   ??? cyclobenzaprine (FLEXERIL) 10 mg tablet Take 1 Tab by mouth nightly for 30 days. 30 Tab 0    ??? ketorolac (TORADOL) 10 mg tablet Take 1 Tab by mouth every eight (8) hours as needed for Pain. Indications: Severe Pain 8 Tab 0   ??? estradiol (ESTRACE) 2 mg tablet take 1 tablet by mouth once daily  0   ??? spironolactone (ALDACTONE) 25 mg tablet take 2 tablets by mouth twice a day  0   ??? citalopram (CELEXA) 20 mg tablet Take  by mouth daily.       No current facility-administered medications on file prior to visit.        Allergies   Allergen Reactions   ??? Codeine Anaphylaxis   ??? Penicillins Anaphylaxis   ??? Food Allergy Formula [Glutamine-C-Quercet-Selen-Brom] Swelling     Peanuts grapes and pineapples   ??? Tramadol Rash and Swelling       Past Medical History:   Diagnosis Date   ??? Asthma due to environmental allergies    ??? Cat allergies    ??? Cerebral palsy [facility])        Past Surgical History:   Procedure Laterality Date   ??? HX ORTHOPAEDIC  1998    quadricep lengthening surgery       Family History  Problem Relation Age of Onset   ??? Stroke Mother    ??? Psychiatric Disorder Mother    ??? Breast Cancer Mother    ??? Alcohol abuse Mother    ??? Hypertension Mother    ??? Colon Cancer Father    ??? Alcohol abuse Father    ??? Heart Disease Father    ??? Hypertension Father    ??? Diabetes Father    ??? Heart Failure Father    ??? Psychiatric Disorder Father    ??? Prostate Cancer Paternal Uncle        Social History     Socioeconomic History   ??? Marital status: SINGLE     Spouse name: Not on file   ??? Number of children: Not on file   ??? Years of education: Not on file   ??? Highest education level: Not on file   Social Needs   ??? Financial resource strain: Not on file   ??? Food insecurity - worry: Not on file   ??? Food insecurity - inability: Not on file   ??? Transportation needs - medical: Not on file   ??? Transportation needs - non-medical: Not on file   Occupational History   ??? Not on file   Tobacco Use   ??? Smoking status: Current Every Day Smoker   ??? Smokeless tobacco: Never Used   Substance and Sexual Activity   ??? Alcohol use: Yes      Comment: very rarely   ??? Drug use: No   ??? Sexual activity: Not Currently   Other Topics Concern   ??? Not on file   Social History Narrative   ??? Not on file       No visits with results within 3 Month(s) from this visit.   Latest known visit with results is:   Hospital Outpatient Visit on 07/02/2016   Component Date Value Ref Range Status   ??? HIV 1/2 Interpretation 07/02/2016 NONREACTIVE  NR   Final   ??? HIV 1/2 result comment 07/02/2016 SEE NOTE    Final    Comment: While this assay is highly sensitive, a non-reactive/negative result for HIV antibodies HIV-1 and HIV-2 and p24 antigen, does not exclude the possibility of exposure to or infection with HIV.  HIV antibodies and/or p24 antigen may be undetectable in some stages of the infection and in some clinical conditions.  Test performed by the [employer] HIV Ag/Ab Combo (CHIV), 4th generation assay.  Recommend consulting relevant CDC guidelines for informing test subject of the result and its interpretation.     ??? Test Description: 07/02/2016 HIV ANTIBODY QUANTITATIVE REFLEX TO GENOTYPE   Final   ??? [id number]   Final   ??? Results: 07/02/2016 SEE REPORT FROM [facility]    Final    [facility]       Physical Exam   Constitutional: He is oriented to person, place, and time. He appears well-developed.   HENT:   Head: Normocephalic and atraumatic.   Cardiovascular: Normal rate, regular rhythm and normal heart sounds.   Pulmonary/Chest: Effort normal and breath sounds normal.   Musculoskeletal: He exhibits deformity.        Right hip: He exhibits tenderness and swelling.   Legs bowed inward    Neurological: He is alert and oriented to person, place, and time.   Skin: Skin is warm and dry. Bruising noted.   To Right hip outer aspect.      Visit Vitals  BP 127/84 (BP  1 Location: Right arm)   Pulse (!) 104   Temp 98.2 ??F (36.8 ??C)   Resp 18   Wt 108 lb 6.4 oz (49.2 kg)   SpO2 96%   BMI 18.04 kg/m??       Diagnoses and all orders for this visit:     1. Fall, initial encounter  -     oxyCODONE-acetaminophen (PERCOCET) 5-325 mg per tablet; Take 1 Tab by mouth every four (4) hours as needed for Pain. Max Daily Amount: 3 Tabs.  Discussed side effects and abuse potential for narcotics, pt aware and will take as directed. Reviewed PMP personally, has seen two providers in our office and received pain medication. Followed by psych Dr Ernie [name]- filling clonazepam for patient. Also dental procedure beginning of October, received two pain rx from Dr Allena Katz and Dr Oneal Grout.   -     REFERRAL TO ORTHOPEDICS  - Strongly encouraged to get lofstrand crutches to help with mobility as had multiple falls in last 6 months. States they are coming in 2 weeks.     2. Spastic diplegic cerebral palsy (HCC)  -     REFERRAL TO ORTHOPEDICS  - Pt requested a specialist to manage multiple musco-skeletal concerns with CP     3. Other chronic pain       - Discussed I will not provide any further pain meds- the medicine today is for acute pain from the fall and is to last until sees pain management.      Disclaimer:  Advised patient to call back or return to office if symptoms worsen/change/persist.  Discussed expected course/resolution/complications of diagnosis in detail with patient.  ??  Medication risks/benefits/alternatives discussed with patient.  Patient was given an after visit summary which includes diagnoses, current medications, & vitals.   ??  Discussed patient instructions and advised to read to all patient instructions regarding care.   ??  Patient expressed understanding with the diagnosis and plan.   This note will not be viewable in MyChart.

## 2016-11-25 ENCOUNTER — Emergency Department: Admit: 2016-11-26 | Payer: MEDICARE | Primary: Adolescent Medicine

## 2016-11-25 DIAGNOSIS — S7001XA Contusion of right hip, initial encounter: Secondary | ICD-10-CM

## 2016-11-25 NOTE — ED Triage Notes (Signed)
Patient arrives via EMS from home.  Pt identifies as a male, states her name is Rodney Arnold.  Pt fell while standing reports pain in R hip.  VSS in route, pt ambulatory on scene.  Hx cerebral palsy and epilepsy.

## 2016-11-25 NOTE — Telephone Encounter (Signed)
Patient called, crying saying her hip hurt and she is in a lot of pain. Please call back     "Rodney Arnold"

## 2016-11-25 NOTE — ED Provider Notes (Signed)
31 y.o. transgender male-to-male with past medical history significant for cerebral palsy, frequent falls, presents via EMS with chief complaint of right hip pain. Patient states that she has frequent falls due to her history of cerebral palsy and unsteady gait. She was seen by her PCP on 11/22/16 after a fall that occurred over the weekend. She was prescribed Percocet as needed for pain, and was referred to orthopedics. Did not have any x-rays performed after this most recent fall. Patient states that she has an appointment scheduled with an orthopedic specialist on 11/30/16. However, she reports that her right hip pain has gotten more severe since the fall. Now she is unable to weight bear as a result of her right hip pain. Patient rates her right hip pain as a 10/10 in severity, and she notes that she has been taking Percocet at home without significant relief. She additionally complains of a focal area of bruising and swelling to the right hip, and she is concerned because the area "keeps getting bigger". She denies any other injuries or areas of concern. Patient specifically denies fevers, chest pain, shortness of breath, nausea, vomiting, diarrhea, constipation, or changes in urination. There are no other acute medical concerns at this time.    Social hx: Positive Tobacco use (current every day smoker); Positive EtOH use (rare); Denies Illicit Drug Abuse    PCP: Lorri FrederickHarrington, Pamela G, NP    Note written by Guadlupe SpanishJessica A.Guy Sandiferavensbergen, Scribe, as dictated by Loni BeckwithAzie, Yalissa Fink, MD 10:37 PM       The history is provided by the patient, medical records and the EMS personnel. No language interpreter was used.        Past Medical History:   Diagnosis Date   ??? Asthma due to environmental allergies    ??? Cat allergies    ??? Cerebral palsy (HCC)        Past Surgical History:   Procedure Laterality Date   ??? HX ORTHOPAEDIC  1998    quadricep lengthening surgery         Family History:   Problem Relation Age of Onset    ??? Stroke Mother    ??? Psychiatric Disorder Mother    ??? Breast Cancer Mother    ??? Alcohol abuse Mother    ??? Hypertension Mother    ??? Colon Cancer Father    ??? Alcohol abuse Father    ??? Heart Disease Father    ??? Hypertension Father    ??? Diabetes Father    ??? Heart Failure Father    ??? Psychiatric Disorder Father    ??? Prostate Cancer Paternal Uncle        Social History     Socioeconomic History   ??? Marital status: SINGLE     Spouse name: Not on file   ??? Number of children: Not on file   ??? Years of education: Not on file   ??? Highest education level: Not on file   Social Needs   ??? Financial resource strain: Not on file   ??? Food insecurity - worry: Not on file   ??? Food insecurity - inability: Not on file   ??? Transportation needs - medical: Not on file   ??? Transportation needs - non-medical: Not on file   Occupational History   ??? Not on file   Tobacco Use   ??? Smoking status: Current Every Day Smoker   ??? Smokeless tobacco: Never Used   Substance and Sexual Activity   ??? Alcohol use: Yes  Comment: very rarely   ??? Drug use: No   ??? Sexual activity: Not Currently   Other Topics Concern   ??? Not on file   Social History Narrative   ??? Not on file         ALLERGIES: Codeine; Penicillins; Food allergy formula [glutamine-c-quercet-selen-brom]; and Tramadol    Review of Systems   Constitutional: Negative for fever.   Respiratory: Negative for shortness of breath.    Cardiovascular: Negative for chest pain.   Gastrointestinal: Negative for constipation, diarrhea, nausea and vomiting.   Genitourinary: Negative for difficulty urinating and dysuria.   Musculoskeletal: Positive for arthralgias (right hip), gait problem and joint swelling (right hip).   Skin: Positive for color change (bruising right hip).   All other systems reviewed and are negative.      Vitals:    11/25/16 2030 11/25/16 2236 11/25/16 2239   BP:  106/75 106/75   Pulse: 98 82    Resp:  18    Temp:  97.7 ??F (36.5 ??C)    SpO2: 100% 98% 98%   Weight:  48.5 kg (107 lb)     Height:  5' (1.524 m)             Physical Exam   Nursing note and vitals reviewed.     CONSTITUTIONAL: Well-appearing; well-nourished; in no apparent distress  HEAD: Normocephalic; atraumatic  EYES: PERRL; EOM intact; conjunctiva and sclera are clear bilaterally.  ENT: No rhinorrhea; normal pharynx with no tonsillar hypertrophy; mucous membranes pink/moist, no erythema, no exudate.  NECK: Supple; non-tender; no cervical lymphadenopathy  CARD: Normal S1, S2; no murmurs, rubs, or gallops. Regular rate and rhythm.  RESP: Normal respiratory effort; breath sounds clear and equal bilaterally; no wheezes, rhonchi, or rales.  ABD: Normal bowel sounds; non-distended; non-tender; no palpable organomegaly, no masses, no bruits.  Back Exam: Normal inspection; no vertebral point tenderness, no CVA tenderness. Normal range of motion.  EXT: Normal ROM in all four extremities; non-tender to palpation; no swelling or deformity; distal pulses are normal, no edema.  SKIN: Warm; dry; no rash. Swelling and ecchymosis right hip with superficial abrasions.  NEURO:Alert and oriented x 3, coherent, NII-XII grossly intact, sensory and motor are non-focal.        MDM  Number of Diagnoses or Management Options  Contusion of right hip, initial encounter:   Hematoma of right hip, initial encounter:   Diagnosis management comments: Assessment: fall with right hip and thigh hematoma, rule out occult fracture. The patient ambulated in the ED without difficulty and appears hemodynamically stable      Plan: analgesia/ aspect of the area/ CT of the pelvis/ serial exam and/ Monitor and Reevaluate.         Amount and/or Complexity of Data Reviewed  Clinical lab tests: ordered and reviewed  Tests in the radiology section of CPT??: ordered and reviewed  Tests in the medicine section of CPT??: ordered and reviewed  Discussion of test results with the performing providers: yes  Decide to obtain previous medical records or to obtain history from  someone other than the patient: yes  Obtain history from someone other than the patient: yes  Review and summarize past medical records: yes  Discuss the patient with other providers: yes  Independent visualization of images, tracings, or specimens: yes    Risk of Complications, Morbidity, and/or Mortality  Presenting problems: moderate  Diagnostic procedures: moderate  Management options: moderate  Procedures     Progress Note:   Pt has been reexamined by Loni BeckwithWilliam Akil Hoos, MD. Pt is feeling much better. Symptoms have improved. All available results have been reviewed with pt and any available family. Pt understands sx, dx, and tx in ED. Care plan has been outlined and questions have been answered. Pt is ready to go home. Will send home on hematoma of the right hip and thigh/ contusion, right hip, and instruction.. Outpatient referral with PCP/ Ortho as previously instructed, and has as needed. Written by Loni BeckwithWilliam Jedrek Dinovo, MD,6:25 AM    .   .

## 2016-11-25 NOTE — ED Notes (Signed)
I have reviewed discharge instructions with the patient.  The patient verbalized understanding.

## 2016-11-25 NOTE — Telephone Encounter (Signed)
Called patient, recommend she go to nearest ER.

## 2016-11-26 ENCOUNTER — Inpatient Hospital Stay
Admit: 2016-11-26 | Discharge: 2016-11-26 | Disposition: A | Discharge status: Home / Self Care | Payer: MEDICARE | Attending: Emergency Medicine

## 2016-11-26 ENCOUNTER — Ambulatory Visit: Admit: 2016-11-26 | Discharge: 2016-11-26 | Payer: MEDICARE | Attending: Family | Primary: Adolescent Medicine

## 2016-11-26 DIAGNOSIS — W19XXXD Unspecified fall, subsequent encounter: Secondary | ICD-10-CM

## 2016-11-26 MED ORDER — HYDROCODONE-ACETAMINOPHEN 5 MG-325 MG TAB
5-325 mg | ORAL | Status: AC
Start: 2016-11-26 — End: 2016-11-25
  Administered 2016-11-26: 04:00:00 via ORAL

## 2016-11-26 MED FILL — HYDROCODONE-ACETAMINOPHEN 5 MG-325 MG TAB: 5-325 mg | ORAL | Qty: 2

## 2016-11-26 NOTE — Progress Notes (Signed)
[name] is a 31 y.o. male     Chief Complaint   Patient presents with   ??? Hospital Follow Up     [facility]       Visit Vitals  BP 106/70 (BP 1 Location: Left arm, BP Patient Position: Sitting)   Pulse 95   Temp 97.7 ??F (36.5 ??C) (Oral)   Resp 16   Ht 5' (1.524 m)   Wt 107 lb (48.5 kg)   SpO2 97%   BMI 20.90 kg/m??       Health Maintenance Due   Topic Date Due   ??? Pneumococcal 19-64 Medium Risk (1 of 1 - PPSV23) 07/09/2004   ??? DTaP/Tdap/Td series (1 - Tdap) 07/10/2006   ??? Influenza Age 73 to Adult  08/18/2016   ??? MEDICARE YEARLY EXAM  10/21/2016       1. Have you been to the ER, urgent care clinic since your last visit?  Hospitalized since your last visit? Yes [facility].    2. Have you seen or consulted any other health care providers outside of the [facility] since your last visit?  Include any pap smears or colon screening. No

## 2016-11-26 NOTE — Progress Notes (Signed)
[facility]   [address], [address]  [place], [place] [address]  P: [phone]  F: [phone]      Chief Complaint   Patient presents with   ??? Hospital Follow Up     seen at [facility] ER yesterday       SUBJECTIVE   Rodney Arnold is a 31 y.o. male who presents to clinic for Hospital Follow Up (seen at [facility] ER yesterday).  HPI:      Pt presents with f/u from ER visit last night. I recommended she go to the ER after calling yesterday endorsing increased pain and states that bruise to hip from previous fall ( seen by me 11/5 please reference my note) was growing larger and filled with blood. Patient states she had CT imaging to her R hip  and was told there were no acute fractures but she had hip impingement.  She has finished the percocet #15 pills provided Monday in addition to Norco given last night in ER. She is requesting more norco to be filled today.       Pt has a history of multple falls 2/2 spastic diplegic cerebral palsy.  She did not fall again after 11/5 visit. Pt states she has f/u appointment 11/13 Ortho @ [facility] and pain management f/u in December.             Patient Active Problem List    Diagnosis   ??? Bipolar disorder in remission [facility])   ??? Spastic diplegic cerebral palsy (HCC)   ??? Family history of colon cancer in father   ??? FH: breast cancer in relative when <33 years old          Past Medical History:   Diagnosis Date   ??? Asthma due to environmental allergies    ??? Cat allergies    ??? Cerebral palsy [facility])      Past Surgical History:   Procedure Laterality Date   ??? HX ORTHOPAEDIC  1998    quadricep lengthening surgery     Social History     Socioeconomic History   ??? Marital status: SINGLE     Spouse name: Not on file   ??? Number of children: Not on file   ??? Years of education: Not on file   ??? Highest education level: Not on file   Social Needs   ??? Financial resource strain: Not on file   ??? Food insecurity - worry: Not on file   ??? Food insecurity - inability: Not on file    ??? Transportation needs - medical: Not on file   ??? Transportation needs - non-medical: Not on file   Occupational History   ??? Not on file   Tobacco Use   ??? Smoking status: Current Every Day Smoker   ??? Smokeless tobacco: Never Used   Substance and Sexual Activity   ??? Alcohol use: Yes     Comment: very rarely   ??? Drug use: No   ??? Sexual activity: Not Currently   Other Topics Concern   ??? Not on file   Social History Narrative   ??? Not on file     Family History   Problem Relation Age of Onset   ??? Stroke Mother    ??? Psychiatric Disorder Mother    ??? Breast Cancer Mother    ??? Alcohol abuse Mother    ??? Hypertension Mother    ??? Colon Cancer Father    ??? Alcohol abuse Father    ???  Heart Disease Father    ??? Hypertension Father    ??? Diabetes Father    ??? Heart Failure Father    ??? Psychiatric Disorder Father    ??? Prostate Cancer Paternal Uncle      Allergies   Allergen Reactions   ??? Codeine Anaphylaxis   ??? Penicillins Anaphylaxis   ??? Food Allergy Formula [Glutamine-C-Quercet-Selen-Brom] Swelling     Peanuts grapes and pineapples   ??? Tramadol Rash and Swelling       Current Outpatient Medications   Medication Sig Dispense Refill   ??? cyclobenzaprine (FLEXERIL) 10 mg tablet Take 1 Tab by mouth nightly. 30 Tab 0   ??? cyclobenzaprine (FLEXERIL) 10 mg tablet Take 1 Tab by mouth nightly for 30 days. 30 Tab 0   ??? estradiol (ESTRACE) 2 mg tablet take 1 tablet by mouth once daily  0   ??? spironolactone (ALDACTONE) 25 mg tablet take 2 tablets by mouth twice a day  0   ??? citalopram (CELEXA) 20 mg tablet Take  by mouth daily.     ??? oxyCODONE-acetaminophen (PERCOCET) 5-325 mg per tablet Take 1 Tab by mouth every four (4) hours as needed for Pain. Max Daily Amount: 3 Tabs. 15 Tab 0   ??? predniSONE (DELTASONE) 20 mg tablet Prednisone 60 mg po x 2 days,40 mg po x 2 days,20 mg po x 1 day,10 mg po x 1 day then stop. 12 Tab 0   ??? ketorolac (TORADOL) 10 mg tablet Take 1 Tab by mouth every eight (8)  hours as needed for Pain. Indications: Severe Pain 8 Tab 0           The medications were reviewed and updated in the medical record.  The past medical history, past surgical history, and family history were reviewed and updated in the medical record.    REVIEW OF SYSTEMS   Review of Systems   Constitutional: Negative for malaise/fatigue.   HENT: Negative for congestion.    Eyes: Negative for blurred vision and pain.   Respiratory: Negative for cough and shortness of breath.    Cardiovascular: Negative for chest pain and palpitations.   Gastrointestinal: Negative for abdominal pain and heartburn.   Genitourinary: Negative for frequency and urgency.   Musculoskeletal: Positive for falls, joint pain and myalgias.   Neurological: Negative for dizziness, tingling, sensory change, weakness and headaches.   Psychiatric/Behavioral: Negative for depression, memory loss and substance abuse.         PHYSICAL EXAM     Visit Vitals  BP 106/70 (BP 1 Location: Left arm, BP Patient Position: Sitting)   Pulse 95   Temp 97.7 ??F (36.5 ??C) (Oral)   Resp 16   Ht 5' (1.524 m)   Wt 107 lb (48.5 kg)   SpO2 97%   BMI 20.90 kg/m??       Physical Exam   Constitutional: He is oriented to person, place, and time and well-developed, well-nourished, and in no distress.   Thin, multiple tattoos.    HENT:   Head: Normocephalic and atraumatic.   Right Ear: External ear normal.   Left Ear: External ear normal.   Cardiovascular: Normal rate, regular rhythm and normal heart sounds.   Pulmonary/Chest: Effort normal and breath sounds normal.   Musculoskeletal: He exhibits tenderness (R hip. Bruise to area. ).        Right hip: He exhibits tenderness and swelling.        Legs:  Neurological: He is alert and oriented to  person, place, and time.   Skin: Skin is warm and dry.   Psychiatric: Affect and judgment normal.       LABS/IMAGING     Results for orders placed or performed during the hospital encounter of 11/25/16   CT PELV WO CONT    Narrative     INDICATION:  Right hip pain/ status post trauma     EXAM: CT pelvis. Comparison October 27, 2016.    Thin section axial images were obtained. From these sagittal and coronal  reformats were performed. CT dose reduction was achieved through use of a  standardized protocol tailored for this examination and automatic exposure  control for dose modulation.     FINDINGS: Bone mineral density is normal. There is flattening of the lateral  margin of the right femoral head without evidence of acute fracture. There is  acetabular over coverage with a small os acetabuli on the right. Incidental bone  island in the right femoral head and neck. No joint space loss or effusion. An  acute fracture is not identified. There is edema in the subcutaneous tissues  laterally.      Impression    IMPRESSION:  1. Subcutaneous edema laterally. No acute fracture  2. Mixed femoral acetabular impingement right hip          ASSESSMENT/ PLAN   Diagnoses and all orders for this visit:    1. Fall, subsequent encounter       - Encouraged to obtain [place] crutches     - Please f[facility]      - Please continue PT as tolerated     2. Pain of right hip joint      -Per my last note 11/5, I do not feel comfortable prescribing narcotics. I did set you up with a pain management specialist.      - Please rotate tylenol/ ibuprofen as directed. Apply ice to R hip.    - I offered steroids to help with inflammation but patient does not want to try at this time.     3. Spastic diplegia (HCC)    - Please work with PT when hip is feeling better            Follow-up Disposition: Not on File    Disclaimer:  Advised patient to call back or return to office if symptoms worsen/change/persist.  Discussed expected course/resolution/complications of diagnosis in detail with patient.  ??  Medication risks/benefits/alternatives discussed with patient.  Patient was given an after visit summary which includes diagnoses, current  medications, & vitals.   ??  Discussed patient instructions and advised to read to all patient instructions regarding care.   ??  Patient expressed understanding with the diagnosis and plan.   This note will not be viewable in MyChart.        Arbutus PedJoseph F Dangelo Guzzetta, NP  11/26/2016        (This document has been electronically signed)

## 2016-12-14 ENCOUNTER — Ambulatory Visit: Admit: 2016-12-14 | Discharge: 2016-12-14 | Payer: MEDICARE | Attending: Family | Primary: Adolescent Medicine

## 2016-12-14 DIAGNOSIS — L739 Follicular disorder, unspecified: Secondary | ICD-10-CM

## 2016-12-14 NOTE — Progress Notes (Signed)
Chief Complaint   Patient presents with   ??? Skin Problem     patient has a boil under the right arm in the armpit.  [facility] and they drained it.  states that there is a lot of pain

## 2016-12-14 NOTE — Progress Notes (Signed)
[facility]   [address]  [place], [place] [address]  P: [phone]  F: [phone]      Chief Complaint   Patient presents with   ??? Skin Problem     patient has a boil under the right arm in the armpit.  states [facility] and they drained it.  states that there is a lot of pain       SUBJECTIVE   Rodney Arnold is a 31 y.o. male who presents to clinic for Skin Problem (patient has a boil under the right arm in the armpit.  states that [facility] and they drained it.  states that there is a lot of pain).  HPI       Patient presents for acute care visit for wound to L armpit x 3 days. [facility] Saturday,  hair follicle became red, inflamed, and swollen. She [facility] they decided to lance as the area was quite big. They drained fluid and applied bandage with instruction to change twice a day.  She was then started on 10 day course of bactrim. She states she may have had fevers yesterday but is taking ibuprofen 4 x 200mg  tabs daily. She requests something stronger for pain today.       Patient Active Problem List    Diagnosis   ??? Bipolar disorder in remission [facility])   ??? Spastic diplegic cerebral palsy (HCC)   ??? Family history of colon cancer in father   ??? FH: breast cancer in relative when <55 years old          Past Medical History:   Diagnosis Date   ??? Asthma due to environmental allergies    ??? Cat allergies    ??? Cerebral palsy [facility])      Past Surgical History:   Procedure Laterality Date   ??? HX ORTHOPAEDIC  1998    quadricep lengthening surgery     Social History     Socioeconomic History   ??? Marital status: SINGLE     Spouse name: Not on file   ??? Number of children: Not on file   ??? Years of education: Not on file   ??? Highest education level: Not on file   Social Needs   ??? Financial resource strain: Not on file   ??? Food insecurity - worry: Not on file    ??? Food insecurity - inability: Not on file   ??? Transportation needs - medical: Not on file   ??? Transportation needs - non-medical: Not on file   Occupational History   ??? Not on file   Tobacco Use   ??? Smoking status: Current Every Day Smoker   ??? Smokeless tobacco: Never Used   Substance and Sexual Activity   ??? Alcohol use: Yes     Comment: very rarely   ??? Drug use: No   ??? Sexual activity: Not Currently   Other Topics Concern   ??? Not on file   Social History Narrative   ??? Not on file     Family History   Problem Relation Age of Onset   ??? Stroke Mother    ??? Psychiatric Disorder Mother    ??? Breast Cancer Mother    ??? Alcohol abuse Mother    ??? Hypertension Mother    ??? Colon Cancer Father    ???  Alcohol abuse Father    ??? Heart Disease Father    ??? Hypertension Father    ??? Diabetes Father    ??? Heart Failure Father    ??? Psychiatric Disorder Father    ??? Prostate Cancer Paternal Uncle      Allergies   Allergen Reactions   ??? Codeine Anaphylaxis   ??? Penicillins Anaphylaxis   ??? Food Allergy Formula [Glutamine-C-Quercet-Selen-Brom] Swelling     Peanuts grapes and pineapples   ??? Tramadol Rash and Swelling       Current Outpatient Medications   Medication Sig Dispense Refill   ??? FLUoxetine (PROZAC) 40 mg capsule Take  by mouth daily.     ??? clonazePAM (KLONOPIN) 1 mg tablet Take  by mouth two (2) times a day.     ??? estradiol (ESTRACE) 2 mg tablet take 1 tablet by mouth once daily  0   ??? spironolactone (ALDACTONE) 25 mg tablet take 2 tablets by mouth twice a day  0   ??? citalopram (CELEXA) 20 mg tablet Take  by mouth daily.     ??? oxyCODONE-acetaminophen (PERCOCET) 5-325 mg per tablet Take 1 Tab by mouth every four (4) hours as needed for Pain. Max Daily Amount: 3 Tabs. 15 Tab 0   ??? cyclobenzaprine (FLEXERIL) 10 mg tablet Take 1 Tab by mouth nightly. 30 Tab 0   ??? predniSONE (DELTASONE) 20 mg tablet Prednisone 60 mg po x 2 days,40 mg po x 2 days,20 mg po x 1 day,10 mg po x 1 day then stop. 12 Tab 0    ??? ketorolac (TORADOL) 10 mg tablet Take 1 Tab by mouth every eight (8) hours as needed for Pain. Indications: Severe Pain 8 Tab 0           The medications were reviewed and updated in the medical record.  The past medical history, past surgical history, and family history were reviewed and updated in the medical record.    REVIEW OF SYSTEMS   Review of Systems   Constitutional: Negative for malaise/fatigue.   HENT: Negative for congestion.    Eyes: Negative for blurred vision and pain.   Respiratory: Negative for cough and shortness of breath.    Cardiovascular: Negative for chest pain and palpitations.   Gastrointestinal: Negative for abdominal pain and heartburn.   Genitourinary: Negative for frequency and urgency.   Musculoskeletal: Positive for falls, joint pain and myalgias.   Neurological: Negative for dizziness, tingling, sensory change, focal weakness, weakness and headaches.   Psychiatric/Behavioral: Negative for depression, memory loss and substance abuse.         PHYSICAL EXAM     Visit Vitals  BP 113/75 (BP 1 Location: Left arm, BP Patient Position: Sitting)   Pulse 84   Temp 98.3 ??F (36.8 ??C) (Oral)   Resp 16   Ht 5' (1.524 m)   Wt 101 lb 12.8 oz (46.2 kg)   SpO2 100%   BMI 19.88 kg/m??       Physical Exam   Constitutional: He is oriented to person, place, and time and well-developed, well-nourished, and in no distress.   Thin appearing,    HENT:   Head: Normocephalic and atraumatic.   Right Ear: External ear normal.   Left Ear: External ear normal.   Cardiovascular: Normal rate, regular rhythm and normal heart sounds.   Pulmonary/Chest: Effort normal and breath sounds normal.   Musculoskeletal: Normal range of motion. He exhibits no edema.   Neurological: He is alert and  oriented to person, place, and time. Gait abnormal.   Skin: Skin is warm and dry.        Circular wound to R armpit, open and draining moderate tan/ yellow fluid.       Cleansed with sterile saline and 2x2 gauze. Dressed with non-adherent pad and secured with paper tape.     No erythema or swelling surrounding wound.    Psychiatric: Affect and judgment normal.   Nursing note and vitals reviewed.      LABS/IMAGING     None today.     ASSESSMENT/ PLAN   Diagnoses and all orders for this visit:    1. Folliculitis of right axilla    - Please follow BID drsg change recommendation of [facility]  - Continue Bactrim BID as she is only on second day. We discussed if wound gets bigger, more drainage, or persistent fevers please F/U with us.   -Provided 2x2 gauze, gloves, and non-adherent pad to cover.   2. History of MRSA infection    On appropriate antibiotic.   3. Spastic diplegia (HCC)      Follow-up Disposition: Not on File    Disclaimer:  Advised patient to call back or return to office if symptoms worsen/change/persist.  Discussed expected course/resolution/complications of diagnosis in detail with patient.  ??  Medication risks/benefits/alternatives discussed with patient.  Patient was given an after visit summary which includes diagnoses, current medications, & vitals.   ??  Discussed patient instructions and advised to read to all patient instructions regarding care.   ??  Patient expressed understanding with the diagnosis and plan.   This note will not be viewable in MyChart.        Arbutus PedJoseph F Jeralynn Vaquera, NP  12/14/2016        (This document has been electronically signed)

## 2016-12-26 ENCOUNTER — Emergency Department: Admit: 2016-12-26 | Payer: MEDICARE | Primary: Adolescent Medicine

## 2016-12-26 ENCOUNTER — Inpatient Hospital Stay
Admit: 2016-12-26 | Discharge: 2016-12-26 | Disposition: A | Discharge status: Home / Self Care | Payer: MEDICARE | Attending: Emergency Medicine

## 2016-12-26 DIAGNOSIS — S60221A Contusion of right hand, initial encounter: Secondary | ICD-10-CM

## 2016-12-26 MED ORDER — NAPROXEN 500 MG TAB
500 mg | ORAL_TABLET | Freq: Two times a day (BID) | ORAL | 0 refills | Status: DC | PRN
Start: 2016-12-26 — End: 2017-03-24

## 2016-12-26 MED ORDER — NAPROXEN 250 MG TAB
250 mg | ORAL | Status: AC
Start: 2016-12-26 — End: 2016-12-26
  Administered 2016-12-26: 19:00:00 via ORAL

## 2016-12-26 MED FILL — NAPROXEN 250 MG TAB: 250 mg | ORAL | Qty: 2

## 2016-12-26 NOTE — ED Provider Notes (Signed)
EMERGENCY DEPARTMENT HISTORY AND PHYSICAL EXAM      Date: 12/26/2016  Patient Name: Rodney Arnold    History of Presenting Illness     Chief Complaint   Patient presents with   ??? Fall   ??? Hip Pain     Presents to the ED via EMS after slipping and falling down 2 flights of stairs, indoors, pta. Denies head trauma/LOC. C/o Rt hip pain, Rt ankle pain, Rt wrist pain, Rt foot pain-no obvious deformities noted.   ??? Wrist Pain   ??? Ankle Pain   ??? Foot Pain       History Provided By: Patient    HPI: Rodney GuadeloupeJoseph Borrayo, 31 y.o. male with PMHx significant for cerebral palsy, asthma, presents via EMS to the ED with cc of gradual onset pain of right hip, ankle, and wrist, onset s/p fall down 2 flights of steps.  Pt reports she flipped over the first flight of steps, and slid down the second flight of steps.  Pt denies trauma to head.  Pt reports currently on Estrogen hormones.  He specifically denies any fevers, chills, nausea, vomiting, chest pain, shortness of breath, headache, rash, diarrhea, sweating or weight loss.    There are no other complaints, changes, or physical findings at this time.    PCP: Lorri FrederickHarrington, Pamela G, NP    Current Outpatient Medications   Medication Sig Dispense Refill   ??? naproxen (NAPROSYN) 500 mg tablet Take 1 Tab by mouth every twelve (12) hours as needed for Pain. 20 Tab 0   ??? FLUoxetine (PROZAC) 40 mg capsule Take  by mouth daily.     ??? clonazePAM (KLONOPIN) 1 mg tablet Take  by mouth two (2) times a day.     ??? estradiol (ESTRACE) 2 mg tablet take 1 tablet by mouth once daily  0   ??? spironolactone (ALDACTONE) 25 mg tablet take 2 tablets by mouth twice a day  0   ??? citalopram (CELEXA) 20 mg tablet Take  by mouth daily.         Past History     Past Medical History:  Past Medical History:   Diagnosis Date   ??? Asthma due to environmental allergies    ??? Cat allergies    ??? Cerebral palsy [facility](HCC)        Past Surgical History:  Past Surgical History:   Procedure Laterality Date   ??? HX ORTHOPAEDIC  1998     quadricep lengthening surgery       Family History:  Family History   Problem Relation Age of Onset   ??? Stroke Mother    ??? Psychiatric Disorder Mother    ??? Breast Cancer Mother    ??? Alcohol abuse Mother    ??? Hypertension Mother    ??? Colon Cancer Father    ??? Alcohol abuse Father    ??? Heart Disease Father    ??? Hypertension Father    ??? Diabetes Father    ??? Heart Failure Father    ??? Psychiatric Disorder Father    ??? Prostate Cancer Paternal Uncle        Social History:  Social History     Tobacco Use   ??? Smoking status: Current Every Day Smoker   ??? Smokeless tobacco: Never Used   Substance Use Topics   ??? Alcohol use: Yes     Comment: very rarely   ??? Drug use: No       Allergies:  Allergies   Allergen  Reactions   ??? Codeine Anaphylaxis   ??? Penicillins Anaphylaxis   ??? Toradol [Ketorolac] Anaphylaxis   ??? Food Allergy Formula [Glutamine-C-Quercet-Selen-Brom] Swelling     Peanuts grapes and pineapples   ??? Tramadol Rash and Swelling         Review of Systems   Review of Systems   Constitutional: Negative.  Negative for activity change, appetite change, chills, fatigue, fever and unexpected weight change.   HENT: Negative.  Negative for congestion, hearing loss, rhinorrhea, sneezing and voice change.    Eyes: Negative.  Negative for pain and visual disturbance.   Respiratory: Negative.  Negative for apnea, cough, choking, chest tightness and shortness of breath.    Cardiovascular: Negative.  Negative for chest pain and palpitations.   Gastrointestinal: Negative.  Negative for abdominal distention, abdominal pain, blood in stool, diarrhea, nausea and vomiting.   Genitourinary: Negative.  Negative for difficulty urinating, flank pain, frequency and urgency.        No discharge   Musculoskeletal: Positive for myalgias (right hip, ankle, and wrist). Negative for arthralgias, back pain and neck stiffness.   Skin: Negative.  Negative for color change and rash.    Neurological: Negative.  Negative for dizziness, seizures, syncope, speech difficulty, weakness, numbness and headaches.   Hematological: Negative for adenopathy.   Psychiatric/Behavioral: Negative.  Negative for agitation, behavioral problems, dysphoric mood and suicidal ideas. The patient is not nervous/anxious.        Physical Exam   Physical Exam   Constitutional: He is oriented to person, place, and time. He appears well-developed and well-nourished. No distress.   HENT:   Head: Normocephalic and atraumatic.   Mouth/Throat: Oropharynx is clear and moist. No oropharyngeal exudate.   Eyes: Conjunctivae and EOM are normal. Pupils are equal, round, and reactive to light. Right eye exhibits no discharge. Left eye exhibits no discharge.   Neck: Normal range of motion. Neck supple.   Cardiovascular: Normal rate, regular rhythm and intact distal pulses. Exam reveals no gallop and no friction rub.   No murmur heard.  Pulmonary/Chest: Effort normal and breath sounds normal. No respiratory distress. He has no wheezes. He has no rales. He exhibits no tenderness.   Abdominal: Soft. Bowel sounds are normal. He exhibits no distension and no mass. There is no tenderness. There is no rebound and no guarding.   Musculoskeletal: Normal range of motion. He exhibits no edema.   Abrasion to right hip, small hematoma over right great trochanter of right hip   Lymphadenopathy:     He has no cervical adenopathy.   Neurological: He is alert and oriented to person, place, and time. No cranial nerve deficit. Coordination normal.   Skin: Skin is warm and dry. No rash noted. No erythema.   Psychiatric: He has a normal mood and affect.   Nursing note and vitals reviewed.      Diagnostic Study Results     Labs -   No results found for this or any previous visit (from the past 12 hour(s)).    Radiologic Studies -   XR HIP RT W OR WO PELV 2-3 VWS   Final Result   IMPRESSION:  No acute abnormality.               XR WRIST RT AP/LAT/OBL MIN 3V    Final Result   IMPRESSION:  No acute abnormality.         XR FOOT RT MIN 3 V   Final Result   IMPRESSION:  No acute abnormality.                   Medical Decision Making   I am the first provider for this patient.    I reviewed the vital signs, available nursing notes, past medical history, past surgical history, family history and social history.    Vital Signs-Reviewed the patient's vital signs.  Patient Vitals for the past 12 hrs:   Temp Pulse Resp BP SpO2   12/26/16 1419 ??? 68 15 95/63 100 %   12/26/16 1345 98.2 ??F (36.8 ??C) 73 16 93/61 100 %       Pulse Oximetry Analysis - 100% on RA    Cardiac Monitor:   Rate: 73 bpm  Rhythm: Normal Sinus Rhythm 1345     Records Reviewed: Nursing Notes, Old Medical Records and Ambulance Run Sheet    Provider Notes (Medical Decision Making):   DDx: strain, sprain, contusion, fracture    ED Course:   Initial assessment performed. The patients presenting problems have been discussed, and they are in agreement with the care plan formulated and outlined with them.  I have encouraged them to ask questions as they arise throughout their visit.    SIGN OUT:  2:15 PM  Patient's presentation, labs/imaging and plan of care was reviewed with   Malachi Paradise, DO as part of sign out.  They will evaluate pt's x-rays as part of the plan discussed with the patient.    Malachi Paradise, DO's assistance in completion of this plan is greatly appreciated but it should be noted that I will be the provider of record for this patient.    Daune Divirgilio L. Hinton Rao, MD    Disposition:  Discharge Note:  2:55 PM  The pt is ready for discharge. The pt's signs, symptoms, diagnosis, and discharge instructions have been discussed and pt has conveyed their understanding. The pt is to follow up as recommended or return to ER should their symptoms worsen. Plan has been discussed and pt is in agreement.    PLAN:  1.   Current Discharge Medication List      START taking these medications    Details    naproxen (NAPROSYN) 500 mg tablet Take 1 Tab by mouth every twelve (12) hours as needed for Pain.  Qty: 20 Tab, Refills: 0           2.   Follow-up Information     Follow up With Specialties Details Why Contact Info    Harrington, Camillia Herter, NP Nurse Practitioner Call in 2 days As needed, If symptoms worsen [address] [place] [address]  [phone]          Return to ED if worse     Diagnosis     Clinical Impression:   1. Contusion of right foot, initial encounter    2. Contusion of right hip, initial encounter    3. Contusion of right hand, initial encounter        Attestations:    This note is prepared by Littie Deeds. Irungu, acting as Neurosurgeon for [employer]. Hinton Rao, MD    Raynelle Bring. Hinton Rao, MD: The scribe's documentation has been prepared under my direction and personally reviewed by me in its entirety. I confirm that the note above accurately reflects all work, treatment, procedures, and medical decision making performed by me.

## 2016-12-26 NOTE — ED Notes (Signed)
Dr. Bryson HaKwan at bedside to provide discharge paperwork. Vital signs stable. Pt in no apparent distress at this time. Mental status at baseline. To waiting room via wheelchair, discharge paperwork in hand. Transportation arranged.

## 2016-12-28 ENCOUNTER — Encounter

## 2016-12-28 MED ORDER — METHYLPREDNISOLONE 4 MG TABS IN A DOSE PACK
4 mg | ORAL | 0 refills | Status: DC
Start: 2016-12-28 — End: 2017-03-24

## 2016-12-29 ENCOUNTER — Encounter

## 2017-01-03 ENCOUNTER — Encounter: Attending: Family | Primary: Adolescent Medicine

## 2017-01-19 ENCOUNTER — Inpatient Hospital Stay
Admit: 2017-01-19 | Discharge: 2017-01-19 | Disposition: A | Discharge status: Home / Self Care | Payer: MEDICARE | Attending: Emergency Medicine

## 2017-01-19 DIAGNOSIS — Z4801 Encounter for change or removal of surgical wound dressing: Secondary | ICD-10-CM

## 2017-01-19 MED ORDER — ONDANSETRON 4 MG TAB, RAPID DISSOLVE
4 mg | ORAL | Status: AC
Start: 2017-01-19 — End: 2017-01-19
  Administered 2017-01-19: 22:00:00 via ORAL

## 2017-01-19 MED ORDER — OXYCODONE 5 MG TAB
5 mg | ORAL_TABLET | Freq: Three times a day (TID) | ORAL | 0 refills | Status: DC | PRN
Start: 2017-01-19 — End: 2017-03-24

## 2017-01-19 MED ORDER — ONDANSETRON 4 MG TAB, RAPID DISSOLVE
4 mg | ORAL_TABLET | Freq: Three times a day (TID) | ORAL | 0 refills | Status: DC | PRN
Start: 2017-01-19 — End: 2017-03-24

## 2017-01-19 MED ORDER — OXYCODONE 5 MG TAB
5 mg | ORAL | Status: AC
Start: 2017-01-19 — End: 2017-01-19
  Administered 2017-01-19: 22:00:00 via ORAL

## 2017-01-19 MED FILL — ONDANSETRON 4 MG TAB, RAPID DISSOLVE: 4 mg | ORAL | Qty: 1

## 2017-01-19 MED FILL — OXYCODONE 5 MG TAB: 5 mg | ORAL | Qty: 1

## 2017-01-19 NOTE — Telephone Encounter (Signed)
Pt stated she was diagnosed with MRSA and needs to speak with a nurse about a treatment plan. Please follow up with pt for further clarification as soon as possible. Bcn:  [phone]

## 2017-01-19 NOTE — ED Notes (Signed)
Pt discharged by Carr, PA. Pt provided with discharge instructions Rx and instructions on follow up care. Pt out of ED in stable condition accompanied by friend.

## 2017-01-19 NOTE — Telephone Encounter (Signed)
PT. Calls today to let us know that she went [facility] for a cyst under her [facility].    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    PT. Stated that the cyst went away for 2 days but has come back.    PT. States that she is with a fever and that the site is painful.    She was referred to plastic surgery to remove and had an appointment on 05/02/17.    Pt was not satisfied with this plan of care and asks that we squeeze her in today.    NP Lucretia RoersWood was notified and said that he would call her with the next plan of care @ [phone]

## 2017-01-19 NOTE — Telephone Encounter (Signed)
Called patient, advised to go to ER as she is experiencing fevers, wounds increasing in size and warm to the touch.

## 2017-01-19 NOTE — ED Provider Notes (Signed)
EMERGENCY DEPARTMENT HISTORY AND PHYSICAL EXAM          Date: 01/19/2017  Patient Name: Rodney Arnold    History of Presenting Illness     Chief Complaint   Patient presents with   ??? Abscess     Pt ambulatory to triage with c/o multiple abscess to R under arm x Saturday; pt states abscess was cultured and tested for MRSA, was put on abx and is not feeling better; pt states had Tylenol around 0830 this am, states fever at home       History Provided By: Patient    HPI: Rodney Arnold is a 32 y.o. male, pmhx CP, MRSA, abscesses, who presents ambulatory to the ED c/o pain in right axilla. Pt is transitioning from male to male and prefers male pronouns. Pt has multiple draining abscesses in right axilla that were drained on Saturday and she was placed on Bactrim. Pt was not given any pain medicine.       She specifically denies any recent fevers, chills, nausea, diarrhea, abd pain, CP, urinary sxs, changes in BM, or headache.     She denies history of diabetes, kidney disease, liver disease, thyroid disease    PCP: Rodney Frederick, NP    There are no other complaints, changes, or physical findings at this time.     Current Facility-Administered Medications   Medication Dose Route Frequency Provider Last Rate Last Dose   ??? oxyCODONE IR (ROXICODONE) tablet 5 mg  5 mg Oral NOW Raelyn Number., PA         Current Outpatient Medications   Medication Sig Dispense Refill   ??? trimethoprim-sulfamethoxazole (BACTRIM DS) 160-800 mg per tablet Take 2 Tabs by mouth two (2) times a day.     ??? methylPREDNISolone (MEDROL DOSEPACK) 4 mg tablet As directed. 1 Dose Pack 0   ??? naproxen (NAPROSYN) 500 mg tablet Take 1 Tab by mouth every twelve (12) hours as needed for Pain. 20 Tab 0   ??? FLUoxetine (PROZAC) 40 mg capsule Take  by mouth daily.     ??? clonazePAM (KLONOPIN) 1 mg tablet Take  by mouth two (2) times a day.     ??? estradiol (ESTRACE) 2 mg tablet take 1 tablet by mouth once daily  0    ??? spironolactone (ALDACTONE) 25 mg tablet take 2 tablets by mouth twice a day  0   ??? citalopram (CELEXA) 20 mg tablet Take  by mouth daily.         Past History     Past Medical History:  Past Medical History:   Diagnosis Date   ??? Asthma due to environmental allergies    ??? Cat allergies    ??? Cerebral palsy [facility])        Past Surgical History:  Past Surgical History:   Procedure Laterality Date   ??? HX ORTHOPAEDIC  1998    quadricep lengthening surgery       Family History:  Family History   Problem Relation Age of Onset   ??? Stroke Mother    ??? Psychiatric Disorder Mother    ??? Breast Cancer Mother    ??? Alcohol abuse Mother    ??? Hypertension Mother    ??? Colon Cancer Father    ??? Alcohol abuse Father    ??? Heart Disease Father    ??? Hypertension Father    ??? Diabetes Father    ??? Heart Failure Father    ??? Psychiatric Disorder  Father    ??? Prostate Cancer Paternal Uncle        Social History:  Social History     Tobacco Use   ??? Smoking status: Current Every Day Smoker   ??? Smokeless tobacco: Never Used   Substance Use Topics   ??? Alcohol use: Yes     Comment: very rarely   ??? Drug use: No       Allergies:  Allergies   Allergen Reactions   ??? Codeine Anaphylaxis   ??? Penicillins Anaphylaxis   ??? Toradol [Ketorolac] Anaphylaxis   ??? Food Allergy Formula [Glutamine-C-Quercet-Selen-Brom] Swelling     Peanuts grapes and pineapples   ??? Tramadol Rash and Swelling         Review of Systems   Review of Systems   Constitutional: Negative for activity change, appetite change, chills, fatigue and fever.   HENT: Negative for congestion, dental problem, rhinorrhea and sore throat.    Eyes: Negative for photophobia, pain, discharge, redness, itching and visual disturbance.   Respiratory: Negative for cough, chest tightness, shortness of breath and wheezing.    Cardiovascular: Negative for chest pain and leg swelling.   Gastrointestinal: Positive for vomiting. Negative for abdominal  distention, abdominal pain, blood in stool, constipation, diarrhea and nausea.   Genitourinary: Negative for discharge, dysuria, flank pain, hematuria and penile pain.   Musculoskeletal: Negative for arthralgias, back pain, gait problem, joint swelling and myalgias.   Skin: Positive for color change and wound. Negative for rash.   Neurological: Negative for dizziness, syncope, weakness, numbness and headaches.   Psychiatric/Behavioral: Negative for confusion and decreased concentration. The patient is not nervous/anxious.       Physical Exam   Physical Exam   Constitutional: He is oriented to person, place, and time. He appears well-developed and well-nourished. No distress.   ambulatory   HENT:   Head: Normocephalic and atraumatic.   Right Ear: External ear normal.   Left Ear: External ear normal.   Nose: Nose normal.   Mouth/Throat: Oropharynx is clear and moist. No oropharyngeal exudate.   Eyes: Conjunctivae and EOM are normal. Pupils are equal, round, and reactive to light. Right eye exhibits no discharge. Left eye exhibits no discharge. No scleral icterus.   Neck: Normal range of motion. Neck supple. No tracheal deviation present.   Cardiovascular: Normal rate, regular rhythm, normal heart sounds and intact distal pulses. Exam reveals no gallop and no friction rub.   No murmur heard.  Pulmonary/Chest: Effort normal and breath sounds normal. No respiratory distress. He has no wheezes. He has no rales. He exhibits no tenderness.   Musculoskeletal: He exhibits no edema or tenderness.        Right hand: Normal.   Lymphadenopathy:     He has no cervical adenopathy.   Neurological: He is alert and oriented to person, place, and time. No cranial nerve deficit. Coordination normal.   Skin: Skin is warm and dry. No rash noted. There is erythema.   Pt has 4 well healing/draining 215mm-1cm in diameter erythematous abscesses in right axilla with one 3mm pustule. No red streaks. No generalized  calor, erythema or induration.    Psychiatric: He has a normal mood and affect. His behavior is normal. Judgment and thought content normal.   Nursing note and vitals reviewed.      Diagnostic Study Results     Labs -   No results found for this or any previous visit (from the past 12 hour(s)).    Radiologic  Studies -   No orders to display     CT Results  (Last 48 hours)    None        CXR Results  (Last 48 hours)    None            Medical Decision Making   I am the first provider for this patient.    I reviewed the vital signs, available nursing notes, past medical history, past surgical history, family history and social history.    Vital Signs-Reviewed the patient's vital signs.  Patient Vitals for the past 12 hrs:   Temp Pulse Resp BP SpO2   01/19/17 1702 ??? 100 ??? ??? 95 %   01/19/17 1414 97.7 ??F (36.5 ??C) (!) 108 18 122/69 100 %       Records Reviewed: Nursing Notes and Old Medical Records    Provider Notes (Medical Decision Making):     DDx: Abscess, cellulitis, folliculitis, hidradenitis    ED Course:   Initial assessment performed. The patients presenting problems have been discussed, and they are in agreement with the care plan formulated and outlined with them.  I have encouraged them to ask questions as they arise throughout their visit.        PROGRESS NOTE:     Procedure Note - Incision and Drainage:   5:05 PM  Performed by: Germaine Pomfret PA-C  Complexity: simple  Skin prepped with Chlorprep.  Sterile field established. Abscess to axilla(e):right was incised with 23gauge needle, and 0.82mLs of purulent drainage was expressed.   Sterile dressing applied.    Estimated blood loss: acant <37mL  The procedure took 1-15 minutes, and pt tolerated well.      5:06 PM  Pt noted to be feeling better , ready for discharge. .  Will follow up as instructed. All questions have been answered, pt voiced understanding and agreement with plan.  Narcotics were prescribed, pt was advised not to  drive or operate heavy machinery.  Specific return precautions provided as well as instructions to return to the ED should sx worsen at any time. Vital signs stable for discharge.   PA-C Felipa Furnace    Disposition:    DISCHARGE NOTE:  5:06 PM  The patient's results have been reviewed with family and/or caregiver. They verbally convey their understanding and agreement of the patient's signs, symptoms, diagnosis, treatment, and prognosis. They additionally agree to follow up as recommended in the discharge instructions or to return to the Emergency Room should the patient's condition change prior to their follow-up appointment. The family and/or caregiver verbally agrees with the care-plan and all of their questions have been answered. The discharge instructions have also been provided to the them along with educational information regarding the patient's diagnosis and a list of reasons why the patient would want to return to the ER prior to their follow-up appointment should their condition change.  PA-C Felipa Furnace    PLAN:  1.   Discharge Medication List as of 01/19/2017  5:09 PM      START taking these medications    Details   oxyCODONE IR (ROXICODONE) 5 mg immediate release tablet Take 1 Tab by mouth every eight (8) hours as needed for Pain. Max Daily Amount: 15 mg., Print, Disp-6 Tab, R-0      ondansetron (ZOFRAN ODT) 4 mg disintegrating tablet Take 1 Tab by mouth every eight (8) hours as needed for Nausea., Normal, Disp-10 Tab, R-0  CONTINUE these medications which have NOT CHANGED    Details   trimethoprim-sulfamethoxazole (BACTRIM DS) 160-800 mg per tablet Take 2 Tabs by mouth two (2) times a day., Historical Med      methylPREDNISolone (MEDROL DOSEPACK) 4 mg tablet As directed., Normal, Disp-1 Dose Pack, R-0      naproxen (NAPROSYN) 500 mg tablet Take 1 Tab by mouth every twelve (12) hours as needed for Pain., Normal, Disp-20 Tab, R-0       FLUoxetine (PROZAC) 40 mg capsule Take  by mouth daily., Historical Med      clonazePAM (KLONOPIN) 1 mg tablet Take  by mouth two (2) times a day., Historical Med      estradiol (ESTRACE) 2 mg tablet take 1 tablet by mouth once daily, Historical Med, R-0      spironolactone (ALDACTONE) 25 mg tablet take 2 tablets by mouth twice a day, Historical Med, R-0      citalopram (CELEXA) 20 mg tablet Take  by mouth daily., Historical Med           2.   Follow-up Information     Follow up With Specialties Details Why Contact Info    Harrington, Camillia Herter, NP Nurse Practitioner In 2 days For wound re-check [address] [place] [address]  [phone]      [facility] EMERGENCY DEPT Emergency Medicine  If symptoms worsen [address]  [place] [address]  [phone]        Return to ED if worse     Diagnosis     Clinical Impression:   1. Wound check, abscess    2. Pain in right axilla              This note will not be viewable in MyChart.

## 2017-01-19 NOTE — Telephone Encounter (Signed)
Patient calls to advise that she was seen at [facility] last week and treated for a cyst under her axilla that had to be lanced. States that she was treated with antibiotics and that the area started to heal, when four more areas appeared. States that she returned to [facility] and they advised there was nothing further they could do and referred her to [facility]. Patient is calling the office this morning with complaints of not feeling well in general, pain and discomfort. States that she was advised to report to ER if she developed a fever, which she denies at this point. Patient is calling because she can not reach [facility] and just gets the answering service. Advised that the office may be on a different schedule due to the holiday. Patient encouraged to report to ER or Urgent Care for treatment and advised of the risks associated with untreated infection. Patient advises, "I know. I have had MRSA and that's no joke." Advised patient to continue trying [facility]. Advised if time permitted I would try to reach them. Advised if she develops fever, redness, warmth of the skin she needs to report to ED. Patient states she will do so. No other questions or concerns voiced. End of encounter.

## 2017-01-19 NOTE — Telephone Encounter (Signed)
Advised patient go to ER.

## 2017-01-26 ENCOUNTER — Ambulatory Visit: Admit: 2017-01-26 | Discharge: 2017-01-26 | Payer: MEDICARE | Attending: Family | Primary: Adolescent Medicine

## 2017-01-26 DIAGNOSIS — L02411 Cutaneous abscess of right axilla: Secondary | ICD-10-CM

## 2017-01-26 NOTE — Progress Notes (Signed)
This note will not be viewable in MyChart.      Rodney Arnold is a  32 y.o. male presents for visit.    Chief Complaint   Patient presents with   ??? Skin Problem     states that she went to the emergency room and could not lance them and she is in a lot of pain     HPI  Patient presents with complaint of painful right axilla abscesses that are ongoing.  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  Last dose of Bactrim was yesterday.  Has seen multiple providers for treatment.  Has an [facility] with [facility] next week and is requesting pain medication to get to the appointment.  Previously referred to pain management as this is a chronic recurrent condition.  Followed by [facility] for mental health and medication management.      Review of Systems   Constitutional: Negative for chills and fever.   Respiratory: Negative for shortness of breath.    Cardiovascular: Negative for chest pain and palpitations.          Visit Vitals  BP 107/70 (BP 1 Location: Left arm, BP Patient Position: Sitting)   Pulse 98   Temp 98 ??F (36.7 ??C) (Oral)   Resp 16   Ht 5\' 6"  (1.676 m)   Wt 107 lb 12.8 oz (48.9 kg)   SpO2 99%   BMI 17.40 kg/m??     Physical Exam   Constitutional: He is oriented to person, place, and time. No distress.   HENT:   Head: Normocephalic and atraumatic.   Eyes: Conjunctivae are normal.   Cardiovascular: Normal rate, regular rhythm and normal heart sounds.   Pulmonary/Chest: Effort normal and breath sounds normal. He has no wheezes.   Neurological: He is alert and oriented to person, place, and time.   Skin: Lesion noted.   Multiple abscess noted in right axilla.   Psychiatric: He has a normal mood and affect. His behavior is normal.   Nursing note and vitals reviewed.              Patient Active Problem List    Diagnosis Date Noted   ??? Bipolar disorder in remission (HCC) 11/01/2016    ??? 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 Family history of colon cancer in father 10/15/2016   ??? FH: breast cancer in relative when <32 years old 10/15/2016         ASSESSMENT AND PLAN:      ICD-10-CM ICD-9-CM   1. Abscess of axilla, right L02.411 682.3   2. Pain in right axilla M79.621 729.5     Orders Placed This Encounter   ??? REFERRAL TO DERMATOLOGY     Referral Priority:   Routine     Referral Type:   Consultation     Referral Reason:   Specialty Services Required     Referred to Provider:   Allegra Granaamden, Sharon, MD     Requested Specialty:   Dermatology     Number of Visits Requested:   1     Diagnoses and all orders for this visit:    1. Abscess of axilla, right  -     REFERRAL TO DERMATOLOGY       -       I left a voicemail for Dr. Guerry Minorsamden's nurse Candise BowensJen at [facility]) [phone] to facilitate an appointment for tomorrow.  Will call patient with details as I hear back.    2. Pain in right axilla     I advised patient I will not be able to prescribe oxycodone as this is not an acute problem and he may need pain management for some time.    Patient reports she is getting married on Friday, 01/28/2017 and hopes to feel significantly improved at that time.          Follow-up Disposition:  Return if symptoms worsen or fail to improve.      Disclaimer:  Advised him to call back or return to office if symptoms worsen/change/persist.  Discussed expected course/resolution/complications of diagnosis in detail with patient.    Medication risks/benefits/alternatives discussed with patient.  He was given an after visit summary which includes diagnoses, current medications, & vitals.     Discussed patient instructions and advised to read to all patient instructions regarding care.     He expressed understanding with the diagnosis and plan.

## 2017-01-26 NOTE — Progress Notes (Signed)
Chief Complaint   Patient presents with   ??? Skin Problem     states that she went to the emergency room and could not lance them and she is in a lot of pain

## 2017-01-26 NOTE — Telephone Encounter (Signed)
Per Pam Harringtons request called patient confirmed id and informed of appointment at 2 with Dr Sheliah Hatchamden tomorrow.  Patient gave verbal understanding and has the address.

## 2017-01-27 NOTE — Telephone Encounter (Signed)
Confirmed patient id and informed patient that she should go to mental health for the script as Rodney Arnold is unable to write a script for a balance dog.

## 2017-01-27 NOTE — Telephone Encounter (Signed)
Patient also wanted to note that the dermatologist has lanced the larger of the boils and has cultured to test for correct antibiotic

## 2017-01-27 NOTE — Telephone Encounter (Signed)
Pt would like a prescription for a balance dog, stated cousin has one and would like to give it to her but in order to have it in the home pt needs a prescription and a letter for it.

## 2017-02-03 NOTE — Telephone Encounter (Signed)
-----   Message from Rafter J Ranchandice L Fouse sent at 02/02/2017  4:52 PM EST -----  Regarding: [name]/Telephone  Contact: [phone]  Pt is requesting a callback to discussmedical necessity for a service animal. Best contact [phone].

## 2017-02-07 ENCOUNTER — Encounter

## 2017-02-07 NOTE — Telephone Encounter (Signed)
lvm with referral information and that letter of medical necessity will be in the mail as soon as completed for service animal.

## 2017-02-07 NOTE — Telephone Encounter (Signed)
Referral to infectious disease ordered.  I spoke with patient who is in agreement with this.  Please provide the contact information to the patient.  I will write a letter supporting a service animal to help her with gait related to multiple falls.  Patient has cerebral palsy with unstable gait.

## 2017-02-07 NOTE — Telephone Encounter (Signed)
Patient states that the reason for the service animal is not for mental health but for helping patient's gait.  Stable her gait

## 2017-02-08 NOTE — Telephone Encounter (Signed)
Letter has been mailed as requested.

## 2017-02-16 ENCOUNTER — Ambulatory Visit: Admit: 2017-02-16 | Discharge: 2017-02-16 | Payer: MEDICARE | Attending: Family | Primary: Adolescent Medicine

## 2017-02-16 DIAGNOSIS — W108XXA Fall (on) (from) other stairs and steps, initial encounter: Secondary | ICD-10-CM

## 2017-02-16 NOTE — Progress Notes (Signed)
Called and spoke with patient after confirming patient identifiers and advised of the following: Results reviewed. ??Please call patient. ??Compliance drug screen report shows fentanyl present which is unexpected. ??[facility]. ??Patient is prescribed Klonopin, Celexa, Prozac by psychiatry. ??Patient will need to follow-up with pain management for controlled substances. ??     Please advise patient he does not need to follow-up with referral to infectious disease for recurrent abscesses. ??I spoke with dermatology who informed me patient has an auto immune disease called hidradenitis Suprativa and is under the care of Dr. Sheliah Hatchamden dermatology for treatment.     Patient states she does" not know where fentanyl would have come from, but ok, whatever. Thank you." No other questions or concerns voiced. End of encounter.

## 2017-02-16 NOTE — Progress Notes (Signed)
Results reviewed.  Please call patient.  Compliance drug screen report shows fentanyl present which is unexpected.  [facility].  Patient is prescribed Klonopin, Celexa, Prozac by psychiatry.  Patient will need to follow-up with pain management for controlled substances.      Please advise patient he does not need to follow-up with referral to infectious disease for recurrent abscesses.  I spoke with dermatology who informed me patient has an auto immune disease called hidradenitis Suprativa and is under the care of Dr. Sheliah Hatchamden dermatology for treatment.

## 2017-02-16 NOTE — Progress Notes (Signed)
This note will not be viewable in MyChart.      Rodney Arnold is a  32 y.o. male presents for visit.    Chief Complaint   Patient presents with   ??? Fall     down 17 stairs yesterday resulting in neck and right hip pain.Went to [facility] ER     Patient presents for follow-up after being evaluated at the [facility] emergency department yesterday after a fall down a flight of stairs.  Accompanied by caseworker Virgina Norfolk of [facility].  Patient was taken to the emergency department via EMS and had extensive imaging which was all negative.  CBC and CMP were within range, toxicology and urinalysis were negative.  Reports reviewed in [place] [employer] EMR.  Today patient is complaining of severe right hip pain unrelieved by Tylenol.  Requesting pain medication.      Fall   The history is provided by the patient. The accident occurred 12 to 24 hours ago. The fall occurred while walking. He fell from a height of 11 - 15 ft. He landed on hard floor. There was no blood loss. The point of impact was the right hip. The pain is severe. There was no entrapment after the fall. There was no drug use involved in the accident. There was no alcohol use involved in the accident. Associated symptoms include extremity weakness. Pertinent negatives include no visual change, no fever, no numbness, no abdominal pain, no bowel incontinence, no nausea, no vomiting, no hematuria, no hearing loss, no loss of consciousness, no tingling and no laceration. The risk factors include recurrent falls.  The symptoms are aggravated by activity. He has tried acetaminophen for the symptoms. The treatment provided no relief.           Review of Systems   Constitutional: Negative for fever.   Respiratory: Negative for shortness of breath.    Cardiovascular: Negative for chest pain and palpitations.   Gastrointestinal: Negative for abdominal pain, bowel incontinence, nausea and vomiting.    Genitourinary: Negative for hematuria.   Musculoskeletal: Positive for back pain, extremity weakness, falls and joint pain.   Neurological: Negative for tingling, loss of consciousness and numbness.   Psychiatric/Behavioral: Depression: bipolar-followed by psychiatry.          Visit Vitals  BP 99/65 (BP 1 Location: Right arm, BP Patient Position: Sitting)   Pulse (!) 55   Temp 98.1 ??F (36.7 ??C) (Oral)   Resp 16   Ht 5\' 6"  (1.676 m)   Wt 110 lb 12.8 oz (50.3 kg)   SpO2 98%   BMI 17.88 kg/m??     Physical Exam   Constitutional: He is oriented to person, place, and time.   Thin   HENT:   Head: Normocephalic and atraumatic.   Eyes: Conjunctivae are normal.   Cardiovascular: Normal rate, regular rhythm and normal heart sounds.   Pulmonary/Chest: Effort normal and breath sounds normal.   Musculoskeletal:        Right hip: He exhibits decreased range of motion, decreased strength, tenderness and swelling.    Right hip effusion. Edema, tenderness, bruising noted.   Neurological: He is alert and oriented to person, place, and time. He exhibits abnormal muscle tone. Gait abnormal.   Difficulty with ambulation.   Skin: No laceration noted. He is not diaphoretic.   Psychiatric: He has a normal mood and affect. His behavior is normal.   Nursing note and vitals reviewed.  Patient Active Problem List    Diagnosis Date Noted   ??? Bipolar disorder in remission (HCC) 11/01/2016   ??? 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 Family history of colon cancer in father 10/15/2016   ??? FH: breast cancer in relative when <32 years old 10/15/2016         ASSESSMENT AND PLAN:      ICD-10-CM ICD-9-CM   1. Fall (on) (from) other stairs and steps, initial encounter W10.8XXA E880.9   2. Hip effusion, right M25.451 719.05   3. Hip pain, acute, right M25.551 719.45   4. Other chronic pain  G89.29 338.29   5. Opioid use F11.90 305.50   6. Spastic diplegic cerebral palsy (HCC) G80.1 343.0     Orders Placed This Encounter    ??? COMPLIANCE DRUG SCREEN/PRESCRIPTION MONITORING   ??? FLUoxetine (PROZAC) 20 mg capsule     Sig: Take  by mouth daily.     Diagnoses and all orders for this visit:    1. Fall (on) (from) other stairs and steps, initial encounter-patient was previously referred to physical therapy who [facility] crut[facility].  Crutches have not yet been delivered.  Patient has not been following up with PT.  Will contact the home medical representative to prompt for sooner delivery of crutches.    2. Hip effusion, right-ice, elevation and continue to monitor    3. Hip pain, acute, right-Tylenol PRN.  PMP showed 13 prescribers and 4 pharmacies.  Advised patient I will not be prescribing opioids for pain management.    4. Other chronic pain   -     COMPLIANCE DRUG SCREEN/PRESCRIPTION MONITORING       -        Referred to pain management  5. Opioid use          -      Dilaudid given to patient in the emergency department.  Most likely be present on compliance drug screen.    6. Spastic diplegic cerebral palsy [facility](HCC)      radiology results and schedule of future radiology studies reviewed with patient      I am concerned about a substance abuse disorder, opioid misuse or diversion. Prescription Monitoring Program data reviewed today.   Will follow up with patient after compliance drug screen is back.        Follow-up Disposition:  Return in about 4 weeks (around 03/16/2017), or if symptoms worsen or fail to improve, for FULL Phyisal Exam.      Disclaimer:  Advised him to call back or return to office if symptoms worsen/change/persist.  Discussed expected course/resolution/complications of diagnosis in detail with patient.    Medication risks/benefits/alternatives discussed with patient.  He was given an after visit summary which includes diagnoses, current medications, & vitals.     Discussed patient instructions and advised to read to all patient instructions regarding care.      He expressed understanding with the diagnosis and plan.

## 2017-02-16 NOTE — Progress Notes (Signed)
Chief Complaint   Patient presents with   ??? Fall     down 17 stairs yesterday resulting in neck and right hip pain.Went to [facility] ER     Visit Vitals  BP 99/65 (BP 1 Location: Right arm, BP Patient Position: Sitting)   Pulse (!) 55   Temp 98.1 ??F (36.7 ??C) (Oral)   Resp 16   Ht 5\' 6"  (1.676 m)   Wt 110 lb 12.8 oz (50.3 kg)   SpO2 98%   BMI 17.88 kg/m??     1. Have you been to the ER, urgent care clinic since your last visit?  Hospitalized since your last visit?Yes ER 02/15/17    2. Have you seen or consulted any other health care providers outside of the [facility] since your last visit?  Include any pap smears or colon screening. No

## 2017-02-16 NOTE — Telephone Encounter (Signed)
Called and spoke with, "[name]," of [facility] [phone], re: the status of patient's crutches. Per [name], the order for crutches was VOIDED on 01/06/2017 after [facility] called the patient and advised that the crutches were ready for pick-up. Emmie Niemanndvised Justine that the patient does not drive. [name] advises that they do not deliver and do not even service the [place] area, they only did so originally as a courtesy because the item requested was a pick-up item. [name] states that they will be happy to fill the order if the patient has a way to pick-up the crutches. Otherwise they will not be able to provide services. End of encounter.

## 2017-02-18 NOTE — Progress Notes (Signed)
NP Grandville SilosJoe Wood requested NN's assistance in getting patients loftstrand crutches delivered to him as he has frequent falls and has a barrier of no transportation to pick up his crutches from the DME company who has filled his order. NN contacted [facility] and spoke with Ed. Patients insurance is accepted here and they have the needed crutches available. Ed confirmed that these would be sent to patient via [employer]. NN asked to fax over order, office notes to support the need of the crutches and demographics to [phone]. NN will gather and fax this information today. Patient notified of this and provided her updated address info for delivery.

## 2017-02-22 LAB — COMPLIANCE DRUG SCREEN/PRESCRIPTION MONITORING

## 2017-02-22 NOTE — Telephone Encounter (Signed)
Pt needs clarification on drug test.

## 2017-02-22 NOTE — Telephone Encounter (Signed)
Confirmed patient id and informed that Pam is still reviewing the results and has not released but as soon as she does will call patient to discuss.

## 2017-03-24 ENCOUNTER — Emergency Department: Admit: 2017-03-24 | Payer: MEDICARE | Primary: Adolescent Medicine

## 2017-03-24 ENCOUNTER — Inpatient Hospital Stay
Admit: 2017-03-24 | Discharge: 2017-03-25 | Discharge status: Against Medical Advice | Payer: MEDICARE | Attending: Emergency Medicine

## 2017-03-24 ENCOUNTER — Observation Stay

## 2017-03-24 DIAGNOSIS — M545 Low back pain: Secondary | ICD-10-CM

## 2017-03-24 LAB — SAMPLES BEING HELD

## 2017-03-24 LAB — METABOLIC PANEL, COMPREHENSIVE
A-G Ratio: 1.2 (ref 1.1–2.2)
ALT (SGPT): 10 U/L — ABNORMAL LOW (ref 12–78)
AST (SGOT): 7 U/L — ABNORMAL LOW (ref 15–37)
Albumin: 3.9 g/dL (ref 3.5–5.0)
Alk. phosphatase: 76 U/L (ref 45–117)
Anion gap: 5 mmol/L (ref 5–15)
BUN/Creatinine ratio: 16 (ref 12–20)
BUN: 13 MG/DL (ref 6–20)
Bilirubin, total: 0.2 MG/DL (ref 0.2–1.0)
CO2: 30 mmol/L (ref 21–32)
Calcium: 9.3 MG/DL (ref 8.5–10.1)
Chloride: 105 mmol/L (ref 97–108)
Creatinine: 0.79 MG/DL (ref 0.70–1.30)
GFR est AA: >60 mL/min/{1.73_m2} (ref >60)
GFR est non-AA: >60 mL/min/{1.73_m2} (ref >60)
Globulin: 3.2 g/dL (ref 2.0–4.0)
Glucose: 92 mg/dL (ref 65–100)
Potassium: 3.9 mmol/L (ref 3.5–5.1)
Protein, total: 7.1 g/dL (ref 6.4–8.2)
Sodium: 140 mmol/L (ref 136–145)

## 2017-03-24 LAB — CBC WITH AUTOMATED DIFF
ABS. BASOPHILS: 0 10*3/uL (ref 0.0–0.1)
ABS. EOSINOPHILS: 0.1 10*3/uL (ref 0.0–0.4)
ABS. IMM. GRANS.: 0 10*3/uL (ref 0.00–0.04)
ABS. LYMPHOCYTES: 1.8 10*3/uL (ref 0.8–3.5)
ABS. MONOCYTES: 0.4 10*3/uL (ref 0.0–1.0)
ABS. NEUTROPHILS: 2.8 10*3/uL (ref 1.8–8.0)
ABSOLUTE NRBC: 0 10*3/uL (ref 0.00–0.01)
BASOPHILS: 1 % (ref 0–1)
EOSINOPHILS: 2 % (ref 0–7)
HCT: 47.5 % (ref 36.6–50.3)
HGB: 15.4 g/dL (ref 12.1–17.0)
IMMATURE GRANULOCYTES: 0 % (ref 0.0–0.5)
LYMPHOCYTES: 35 % (ref 12–49)
MCH: 30.1 PG (ref 26.0–34.0)
MCHC: 32.4 g/dL (ref 30.0–36.5)
MCV: 92.8 FL (ref 80.0–99.0)
MONOCYTES: 7 % (ref 5–13)
MPV: 10.2 FL (ref 8.9–12.9)
NEUTROPHILS: 55 % (ref 32–75)
NRBC: 0 PER 100 WBC
PLATELET: 175 10*3/uL (ref 150–400)
RBC: 5.12 M/uL (ref 4.10–5.70)
RDW: 12.4 % (ref 11.5–14.5)
WBC: 5.2 10*3/uL (ref 4.1–11.1)

## 2017-03-24 MED ORDER — SODIUM CHLORIDE 0.9 % IJ SYRG
Freq: Once | INTRAMUSCULAR | Status: AC
Start: 2017-03-24 — End: 2017-03-24
  Administered 2017-03-24: 22:00:00 via INTRAVENOUS

## 2017-03-24 MED ORDER — ENOXAPARIN 40 MG/0.4 ML SUB-Q SYRINGE
40 mg/0.4 mL | SUBCUTANEOUS | Status: DC
Start: 2017-03-24 — End: 2017-03-24

## 2017-03-24 MED ORDER — SODIUM CHLORIDE 0.9 % IJ SYRG
INTRAMUSCULAR | Status: DC | PRN
Start: 2017-03-24 — End: 2017-03-25

## 2017-03-24 MED ORDER — HYDROMORPHONE (PF) 1 MG/ML IJ SOLN
1 mg/mL | INTRAMUSCULAR | Status: AC
Start: 2017-03-24 — End: 2017-03-24
  Administered 2017-03-24: 23:00:00 via INTRAVENOUS

## 2017-03-24 MED ORDER — HYDROMORPHONE (PF) 1 MG/ML IJ SOLN
1 mg/mL | INTRAMUSCULAR | Status: AC
Start: 2017-03-24 — End: 2017-03-24
  Administered 2017-03-24: 21:00:00 via INTRAVENOUS

## 2017-03-24 MED ORDER — HYDROMORPHONE (PF) 2 MG/ML IJ SOLN
2 mg/mL | Freq: Once | INTRAMUSCULAR | Status: AC
Start: 2017-03-24 — End: 2017-03-24
  Administered 2017-03-24: 19:00:00 via INTRAVENOUS

## 2017-03-24 MED ORDER — HYDROMORPHONE (PF) 2 MG/ML IJ SOLN
2 mg/mL | Freq: Once | INTRAMUSCULAR | Status: AC
Start: 2017-03-24 — End: 2017-03-24
  Administered 2017-03-24: 18:00:00 via INTRAVENOUS

## 2017-03-24 MED ORDER — SODIUM CHLORIDE 0.9 % IJ SYRG
Freq: Three times a day (TID) | INTRAMUSCULAR | Status: DC
Start: 2017-03-24 — End: 2017-03-25
  Administered 2017-03-25 (×3): via INTRAVENOUS

## 2017-03-24 MED ORDER — GADOTERATE MEGLUMINE 0.5 MMOL/ML IV SOLUTION
0.5 mmol/mL (376.9 mg/mL) | Freq: Once | INTRAVENOUS | Status: AC
Start: 2017-03-24 — End: 2017-03-24
  Administered 2017-03-24: 22:00:00 via INTRAVENOUS

## 2017-03-24 MED FILL — HYDROMORPHONE (PF) 2 MG/ML IJ SOLN: 2 mg/mL | INTRAMUSCULAR | Qty: 1

## 2017-03-24 MED FILL — HYDROMORPHONE (PF) 1 MG/ML IJ SOLN: 1 mg/mL | INTRAMUSCULAR | Qty: 1

## 2017-03-24 MED FILL — DOTAREM 0.5 MMOL/ML (376.9 MG/ML) INTRAVENOUS SOLUTION: 0.5 mmol/mL (376.9 mg/mL) | INTRAVENOUS | Qty: 10

## 2017-03-24 MED FILL — MONOJECT PREFILL ADVANCED 0.9 % SODIUM CHLORIDE INJECTION SYRINGE: INTRAMUSCULAR | Qty: 40

## 2017-03-24 NOTE — ED Provider Notes (Addendum)
HPI         [age]y M with hx of CP here s/p fall. States he tripped on his feet (which is not unusual for him). Larey SeatFell backwards and landed on the lower back. Reports numbness and weakness in bilat lower ext since the accident which are new. No other complaints. No saddle anesthesia. No bladder or bowel problems since the accident. Did not hit his head or have LOC.     Past Medical History:   Diagnosis Date   ??? Asthma due to environmental allergies    ??? Cat allergies    ??? Cerebral palsy (HCC)        Past Surgical History:   Procedure Laterality Date   ??? HX ORTHOPAEDIC  1998    quadricep lengthening surgery         Family History:   Problem Relation Age of Onset   ??? Stroke Mother    ??? Psychiatric Disorder Mother    ??? Breast Cancer Mother    ??? Alcohol abuse Mother    ??? Hypertension Mother    ??? Colon Cancer Father    ??? Alcohol abuse Father    ??? Heart Disease Father    ??? Hypertension Father    ??? Diabetes Father    ??? Heart Failure Father    ??? Psychiatric Disorder Father    ??? Prostate Cancer Paternal Uncle        Social History     Socioeconomic History   ??? Marital status: SINGLE     Spouse name: Not on file   ??? Number of children: Not on file   ??? Years of education: Not on file   ??? Highest education level: Not on file   Social Needs   ??? Financial resource strain: Not on file   ??? Food insecurity - worry: Not on file   ??? Food insecurity - inability: Not on file   ??? Transportation needs - medical: Not on file   ??? Transportation needs - non-medical: Not on file   Occupational History   ??? Not on file   Tobacco Use   ??? Smoking status: Current Every Day Smoker   ??? Smokeless tobacco: Never Used   Substance and Sexual Activity   ??? Alcohol use: Yes     Comment: very rarely   ??? Drug use: No   ??? Sexual activity: Not Currently   Other Topics Concern   ??? Not on file   Social History Narrative   ??? Not on file         ALLERGIES: Codeine; Penicillins; Toradol [ketorolac]; Food allergy formula [glutamine-c-quercet-selen-brom]; and Tramadol     Review of Systems   Review of Systems   Constitutional: (-) weight loss.   HEENT: (-) stiff neck   Eyes: (-) discharge.   Respiratory: (-) cough.    Cardiovascular: (-) syncope.   Gastrointestinal: (-) blood in stool.   Genitourinary: (-) hematuria.  Musculoskeletal: (-) myalgias.   Neurological: (-) seizure.   Skin: (-) petechiae  Lymph/Immunologic: (-) enlarged lymph nodes  All other systems reviewed and are negative.        Vitals:    03/24/17 1225   BP: 124/66   Pulse: 97   Resp: 20   Temp: 98.7 ??F (37.1 ??C)   SpO2: 98%   Weight: 49.9 kg (110 lb)   Height: 5\' 6"  (1.676 m)            Physical Exam Nursing note and vitals reviewed.  Constitutional: oriented to person, place, and time. appears well-developed and well-nourished. No distress.  Head: Normocephalic and atraumatic. Sclera anicteric  Nose: No rhinorrhea  Mouth/Throat: Oropharynx is clear and moist. Pharynx normal  Eyes: Conjunctivae are normal. Pupils are equal, round, and reactive to light. Right eye exhibits no discharge. Left eye exhibits no discharge.  Neck: Painless normal range of motion. Neck supple. No LAD.  Cardiovascular: Normal rate, regular rhythm, normal heart sounds and intact distal pulses.  Exam reveals no gallop and no friction rub.  No murmur heard.  Pulmonary/Chest:  No respiratory distress. No wheezes. No rales. No rhonchi. No increased work of breathing. No accessory muscle use. Good air exchange throughout.  Abdominal: soft, non-tender, no rebound or guarding. No hepatosplenomegaly. Normal bowel sounds throughout.  Back: no tenderness to palpation, no deformities, no CVA tenderness  Extremities/Musculoskeletal: Normal range of motion. no tenderness. No edema. Distal extremities with good pulses and warm.  Lymphadenopathy:   No adenopathy.   Neurological:  Alert and oriented to person, place, and time. CN 2-12 intact bilat. Decreased sensation to touch in bilat lower ext and not able to move legs on exam.   Skin: Skin is warm and dry. No rash noted. No pallor.       MDM 50y M here with low back pain and inability to feel or move legs s/p fall from getting out of bed. Will start with xrays of the lumbar spine. May need MRI.       Procedures    2:25 PM  Can't have MRI due to body piercings. Will check CT for fx. Noted to be rolling around in the bed.

## 2017-03-24 NOTE — Progress Notes (Addendum)
Primary Nurse Katheran Jamesrystal Owens, RN and Theressa Millarderi Dix, RN performed a dual skin assessment on this patient No impairment noted  Braden score is 21    Tattoos to entire body, multiple piercings.

## 2017-03-24 NOTE — Telephone Encounter (Signed)
Please call patient back as soon as possible. She is in the emergency room and needs to speak with a nurse regarding some information. Please advise

## 2017-03-24 NOTE — Progress Notes (Signed)
Admission Medication Reconciliation:    Information obtained from: Patient, chart, RX Query    Significant PMH/Disease States:   Past Medical History:   Diagnosis Date   ??? Asthma due to environmental allergies    ??? Cat allergies    ??? Cerebral palsy [facility](HCC)        Chief Complaint for this Admission:  Fall, numbness    Allergies:  Codeine; Penicillins; Toradol [ketorolac]; Food allergy formula [glutamine-c-quercet-selen-brom]; and Tramadol    Prior to Admission Medications:   Prior to Admission Medications   Prescriptions Last Dose Informant Patient Reported? Taking?   FLUoxetine (PROZAC) 40 mg capsule 03/23/2017 at Unknown time  Yes Yes   Sig: Take  by mouth daily.   citalopram (CELEXA) 20 mg tablet 03/24/2017 at Unknown time  Yes Yes   Sig: Take  by mouth daily.   clonazePAM (KLONOPIN) 1 mg tablet 03/24/2017 at Unknown time  Yes Yes   Sig: Take  by mouth two (2) times a day.   estradiol (ESTRACE) 2 mg tablet 03/24/2017 at Unknown time  Yes Yes   Sig: take 1 tablet by mouth once daily   spironolactone (ALDACTONE) 25 mg tablet 03/24/2017 at Unknown time  Yes Yes   Sig: take 2 tablets by mouth twice a day      Facility-Administered Medications: None         Comments/Recommendations: Patient stated that preferred name is "Ravyn Nytingale-White," wishes to be addressed as such, is currently in transition from male to male. Assured her that I could not change information on record to reflect preference as requested, but would record her name preferences in my note. Husband present for conversation.     Note:  1. Estradiol and Spironolactone used as feminizing hormone therapy    Deleted:  1. Fluoxetine 20 mg  2. Methyprednisolone  3. Naproxen  4. Ondansetron  5. Oxycodone  6. Bactrim    Thank you for allowing me to participate in the care of your patient.    [place] Mamricia Rowan PharmD, RN [phone]

## 2017-03-24 NOTE — Other (Signed)
TRANSFER - OUT REPORT:    Verbal report given to MicrosoftCrystal RN (name) on Rodney Arnold  being transferred to 6 east (unit) for routine progression of care       Report consisted of patient???s Situation, Background, Assessment and   Recommendations(SBAR).     Information from the following report(s) SBAR, ED Summary, [facility] and Recent Results was reviewed with the receiving nurse.    Lines:   Peripheral IV 03/24/17 Right Forearm (Active)   Site Assessment Clean, dry, & intact 03/24/2017  1:09 PM   Phlebitis Assessment 0 03/24/2017  1:09 PM   Infiltration Assessment 0 03/24/2017  1:09 PM   Dressing Status Clean, dry, & intact 03/24/2017  1:09 PM   Dressing Type Transparent 03/24/2017  1:09 PM   Hub Color/Line Status Pink;Flushed 03/24/2017  1:09 PM   Action Taken Blood drawn 03/24/2017  1:09 PM        Opportunity for questions and clarification was provided.      Patient transported with:   transportation

## 2017-03-24 NOTE — H&P (Signed)
Hospitalist Admission NoteNAME: Rodney Arnold   DOB:  03/15/1985   MRN:  [id number]     Date/Time:  03/24/2017 7:37 PM    Patient PCP: Rodney Bee, NP  ______________________________________________________________________  Given the patient's current clinical presentation, I have a high level of concern for decompensation if discharged from the emergency department.  Complex decision making was performed, which includes reviewing the patient's available past medical records, laboratory results, and x-ray films.       My assessment of this patient's clinical condition and my plan of care is as follows.    Assessment / Plan:  1. Fall with lower extremity weakness - admit to medsurg.  ptot eval.  Mri with mild disc bulge await official report.    2. Sex reassignment therapy - currently on estrace and spironolactone.  Prefers to go by "Rodney Arnold"    3. 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  Followed by dermatology    4. Cerebral palsy - uses crutches for gait assistance    5. Possible drug seeking behavior - reviewed pcp note on 02/16/2017 in epid.  Pt referred to pain management by pcp who is not prescribing pain meds for pt.    Code Status: dnr  Surrogate Decision Maker: Rodney Arnold [id number]    DVT Prophylaxis: heparin sq  GI Prophylaxis: not indicated    Baseline: uses crutches at home      Subjective:   CHIEF COMPLAINT: fall lower extremity paralysis    HISTORY OF PRESENT ILLNESS:     Rodney Arnold is a 32 y.o.  Caucasian male who presents with sp fall earlier today, pt fell backwards on lower back and reported numbness paralysis of the le's since the event he was evaluated in the er wth an mri which showed mild disc bulge with official report pending.  The pt reports 10/10 pain but doesn't appear in discomfort. As per pcp notes the pt was at [facility] er recently for fall and received dilaudid for pain.  pcp has advised pt that she will not be prescribing him opioids     We were asked to admit for work up and evaluation of the above problems.     Past Medical History:   Diagnosis Date   ??? Asthma due to environmental allergies    ??? Cat allergies    ??? Cerebral palsy ([place])         Past Surgical History:   Procedure Laterality Date   ??? HX ORTHOPAEDIC  1998    quadricep lengthening surgery       Social History     Tobacco Use   ??? Smoking status: Current Every Day Smoker   ??? Smokeless tobacco: Never Used   Substance Use Topics   ??? Alcohol use: Yes     Comment: very rarely        Family History   Problem Relation Age of Onset   ??? Stroke Mother    ??? Psychiatric Disorder Mother    ??? Breast Cancer Mother    ??? Alcohol abuse Mother    ??? Hypertension Mother    ??? Colon Cancer Father    ??? Alcohol abuse Father    ??? Heart Disease Father    ??? Hypertension Father    ??? Diabetes Father    ??? Heart Failure Father    ??? Psychiatric Disorder Father    ??? Prostate Cancer Paternal Uncle      Allergies   Allergen Reactions   ???  Codeine Anaphylaxis   ??? Penicillins Anaphylaxis   ??? Toradol [Ketorolac] Anaphylaxis   ??? Food Allergy Formula [Glutamine-C-Quercet-Selen-Brom] Swelling     Peanuts grapes and pineapples   ??? Tramadol Rash and Swelling        Prior to Admission medications    Medication Sig Start Date End Date Taking? Authorizing Provider   FLUoxetine (PROZAC) 20 mg capsule Take  by mouth daily.   Yes Provider, Historical   FLUoxetine (PROZAC) 40 mg capsule Take  by mouth daily.   Yes Provider, Historical   clonazePAM (KLONOPIN) 1 mg tablet Take  by mouth two (2) times a day.   Yes Provider, Historical   estradiol (ESTRACE) 2 mg tablet take 1 tablet by mouth once daily 10/05/16  Yes Provider, Historical   spironolactone (ALDACTONE) 25 mg tablet take 2 tablets by mouth twice a day 10/05/16  Yes Provider, Historical   citalopram (CELEXA) 20 mg tablet Take  by mouth daily.   Yes Provider, Historical   trimethoprim-sulfamethoxazole (BACTRIM DS) 160-800 mg per tablet Take 2  Tabs by mouth two (2) times a day.    Other, Phys, MD   oxyCODONE IR (ROXICODONE) 5 mg immediate release tablet Take 1 Tab by mouth every eight (8) hours as needed for Pain. Max Daily Amount: 15 mg. 01/19/17   Daine Floras., PA   ondansetron [facility] ODT) 4 mg disintegrating tablet Take 1 Tab by mouth every eight (8) hours as needed for Nausea. 01/19/17   Daine Floras., PA   methylPREDNISolone (MEDROL DOSEPACK) 4 mg tablet As directed. 12/28/16   Marcelene Butte, NP   naproxen (NAPROSYN) 500 mg tablet Take 1 Tab by mouth every twelve (12) hours as needed for Pain. 12/26/16   Elder Cyphers, MD       REVIEW OF SYSTEMS:     I am not able to complete the review of systems because:   The patient is intubated and sedated    The patient has altered mental status due to his acute medical problems    The patient has baseline aphasia from prior stroke(s)    The patient has baseline dementia and is not reliable historian    The patient is in acute medical distress and unable to provide information           Total of 12 systems reviewed as follows:       POSITIVE= underlined text  Negative = text not underlined  General:  fever, chills, sweats, generalized weakness, weight loss/gain,      loss of appetite   Eyes:    blurred vision, eye pain, loss of vision, double vision  ENT:    rhinorrhea, pharyngitis   Respiratory:   cough, sputum production, SOB, DOE, wheezing, pleuritic pain   Cardiology:   chest pain, palpitations, orthopnea, PND, edema, syncope   Gastrointestinal:  abdominal pain , N/V, diarrhea, dysphagia, constipation, bleeding   Genitourinary:  frequency, urgency, dysuria, hematuria, incontinence   Muskuloskeletal :  arthralgia, myalgia, back pain  Hematology:  easy bruising, nose or gum bleeding, lymphadenopathy   Dermatological: rash, ulceration, pruritis, color change / jaundice  Endocrine:   hot flashes or polydipsia   Neurological:  headache, dizziness, confusion, focal weakness, paresthesia,      Speech difficulties, memory loss, gait difficulty  Psychological: Feelings of anxiety, depression, agitation    Objective:   VITALS:    Visit Vitals  BP 112/71   Pulse 85   Temp 97.7 ??F (36.5 ??  C)   Resp 26   Ht '5\' 6"'  (1.676 m)   Wt 49.9 kg (110 lb)   SpO2 96%   BMI 17.75 kg/m??       PHYSICAL EXAM:    General:    Alert, cooperative, no distress, appears stated age.   Multiple tatoos throughout the body.  HEENT: Atraumatic, anicteric sclerae, pink conjunctivae     No oral ulcers, mucosa moist, throat clear, dentition fair  Neck:  Supple, symmetrical,  thyroid: non tender  Lungs:   Clear to auscultation bilaterally.  No Wheezing or Rhonchi. No rales.  Chest wall:  No tenderness  No Accessory muscle use.  Heart:   Regular  rhythm,  No  murmur   No edema  Abdomen:   Soft, non-tender. Not distended.  Bowel sounds normal  Extremities: 0/5 strength bl le's   pulse 2+  Skin:     Not pale.  Not Jaundiced  No rashes   Psych:  Good insight.  Not depressed.  Not anxious or agitated.  Neurologic: 0/2 reflexes patellar / achilles.  Babinski is equivocal  _______________________________________________________________________  Care Plan discussed with:    Comments   Patient     Family      RN     Care Manager                    Consultant:      _______________________________________________________________________  Expected  Disposition:   Home with Family    HH/PT/OT/RN    SNF/LTC    [facility]    ________________________________________________________________________  TOTAL TIME:  38 Minutes    Critical Care Provided     Minutes non procedure based      Comments     Reviewed previous records   >50% of visit spent in counseling and coordination of care  Discussion with patient and/or family and questions answered       ________________________________________________________________________  Signed: Tomasa Hose, DO    Procedures: see electronic medical records for all procedures/Xrays and  details which were not copied into this note but were reviewed prior to creation of Plan.    LAB DATA REVIEWED:    Recent Results (from the past 24 hour(s))   CBC WITH AUTOMATED DIFF    Collection Time: 03/24/17  1:14 PM   Result Value Ref Range    WBC 5.2 4.1 - 11.1 K/uL    RBC 5.12 4.10 - 5.70 M/uL    HGB 15.4 12.1 - 17.0 g/dL    HCT 47.5 36.6 - 50.3 %    MCV 92.8 80.0 - 99.0 FL    MCH 30.1 26.0 - 34.0 PG    MCHC 32.4 30.0 - 36.5 g/dL    RDW 12.4 11.5 - 14.5 %    PLATELET 175 150 - 400 K/uL    MPV 10.2 8.9 - 12.9 FL    NRBC 0.0 0 PER 100 WBC    ABSOLUTE NRBC 0.00 0.00 - 0.01 K/uL    NEUTROPHILS 55 32 - 75 %    LYMPHOCYTES 35 12 - 49 %    MONOCYTES 7 5 - 13 %    EOSINOPHILS 2 0 - 7 %    BASOPHILS 1 0 - 1 %    IMMATURE GRANULOCYTES 0 0.0 - 0.5 %    ABS. NEUTROPHILS 2.8 1.8 - 8.0 K/UL    ABS. LYMPHOCYTES 1.8 0.8 - 3.5 K/UL    ABS. MONOCYTES 0.4 0.0 - 1.0 K/UL  ABS. EOSINOPHILS 0.1 0.0 - 0.4 K/UL    ABS. BASOPHILS 0.0 0.0 - 0.1 K/UL    ABS. IMM. GRANS. 0.0 0.00 - 0.04 K/UL    DF AUTOMATED     METABOLIC PANEL, COMPREHENSIVE    Collection Time: 03/24/17  1:14 PM   Result Value Ref Range    Sodium 140 136 - 145 mmol/L    Potassium 3.9 3.5 - 5.1 mmol/L    Chloride 105 97 - 108 mmol/L    CO2 30 21 - 32 mmol/L    Anion gap 5 5 - 15 mmol/L    Glucose 92 65 - 100 mg/dL    BUN 13 6 - 20 MG/DL    Creatinine 0.79 0.70 - 1.30 MG/DL    BUN/Creatinine ratio 16 12 - 20      GFR est AA >60 >60 ml/min/1.66m    GFR est non-AA >60 >60 ml/min/1.764m   Calcium 9.3 8.5 - 10.1 MG/DL    Bilirubin, total 0.2 0.2 - 1.0 MG/DL    ALT (SGPT) 10 (L) 12 - 78 U/L    AST (SGOT) 7 (L) 15 - 37 U/L    Alk. phosphatase 76 45 - 117 U/L    Protein, total 7.1 6.4 - 8.2 g/dL    Albumin 3.9 3.5 - 5.0 g/dL    Globulin 3.2 2.0 - 4.0 g/dL    A-G Ratio 1.2 1.1 - 2.2     SAMPLES BEING HELD    Collection Time: 03/24/17  1:14 PM   Result Value Ref Range    SAMPLES BEING HELD 1red 1blu     COMMENT         Add-on orders for these samples will be processed based on acceptable specimen integrity and analyte stability, which may vary by analyte.

## 2017-03-24 NOTE — Progress Notes (Signed)
Problem: Falls - Risk of  Goal: *Absence of Falls  Document Schmid Fall Risk and appropriate interventions in the flowsheet.  Outcome: Progressing Towards Goal  Fall Risk Interventions:  Mobility Interventions: Patient to call before getting OOB         Medication Interventions: Evaluate medications/consider consulting pharmacy    Elimination Interventions: Call light in reach    History of Falls Interventions: Room close to nurse's station

## 2017-03-24 NOTE — ED Notes (Signed)
Called MRI. Johnson MD verbally ordered MRI with and without contrast.

## 2017-03-24 NOTE — ED Triage Notes (Signed)
Triage:  Pt to ED via EMS due to concerns over a fall with associated pain to mid lower back and developed numbness and weakness to both lower legs.  Pt states history of CP with reoccurring falls.

## 2017-03-24 NOTE — ED Notes (Signed)
Called MRI and they are able to complete MRI with dermal piercing.

## 2017-03-24 NOTE — ED Notes (Signed)
Pt has dermal piercing in place. Pt reports unable to remove the jewelery. MD notified.

## 2017-03-24 NOTE — ED Notes (Signed)
PROGRESS NOTE:  2:00 PM  Pt care transferred from Dr. Laural BenesJohnson to Dr. Dudley Majoriskin at change of shift.    CONSULT NOTE:  6:42 PM Rodney Arnold, Marye RoundFrancis J, MD spoke with Dr. Christie BeckersMathur, Consult for Hospitalist.  Discussed available diagnostic tests and clinical findings.  Dr. Christie BeckersMathur will admit the pt.     6:42 PM  Patient is being admitted to the hospital.  The results of their tests and reasons for their admission have been discussed with them and/or available family.  They convey agreement and understanding for the need to be admitted and for their admission diagnosis.

## 2017-03-25 LAB — METABOLIC PANEL, BASIC
Anion gap: 7 mmol/L (ref 5–15)
BUN/Creatinine ratio: 13 (ref 12–20)
BUN: 10 MG/DL (ref 6–20)
CO2: 27 mmol/L (ref 21–32)
Calcium: 8.4 MG/DL — ABNORMAL LOW (ref 8.5–10.1)
Chloride: 103 mmol/L (ref 97–108)
Creatinine: 0.79 MG/DL (ref 0.70–1.30)
GFR est AA: >60 mL/min/{1.73_m2} (ref >60)
GFR est non-AA: >60 mL/min/{1.73_m2} (ref >60)
Glucose: 92 mg/dL (ref 65–100)
Potassium: 4.1 mmol/L (ref 3.5–5.1)
Sodium: 137 mmol/L (ref 136–145)

## 2017-03-25 LAB — DRUG SCREEN, URINE
AMPHETAMINES: NEGATIVE
BARBITURATES: NEGATIVE
BENZODIAZEPINES: POSITIVE — AB
COCAINE: NEGATIVE
METHADONE: NEGATIVE
OPIATES: POSITIVE — AB
PCP(PHENCYCLIDINE): NEGATIVE
THC (TH-CANNABINOL): NEGATIVE

## 2017-03-25 LAB — CBC W/O DIFF
ABSOLUTE NRBC: 0 10*3/uL (ref 0.00–0.01)
HCT: 44.5 % (ref 36.6–50.3)
HGB: 14.3 g/dL (ref 12.1–17.0)
MCH: 29.9 PG (ref 26.0–34.0)
MCHC: 32.1 g/dL (ref 30.0–36.5)
MCV: 93.1 FL (ref 80.0–99.0)
MPV: 10.3 FL (ref 8.9–12.9)
NRBC: 0 PER 100 WBC
PLATELET: 167 10*3/uL (ref 150–400)
RBC: 4.78 M/uL (ref 4.10–5.70)
RDW: 12.2 % (ref 11.5–14.5)
WBC: 6.7 10*3/uL (ref 4.1–11.1)

## 2017-03-25 MED ORDER — ONDANSETRON (PF) 4 MG/2 ML INJECTION
4 mg/2 mL | INTRAMUSCULAR | Status: DC | PRN
Start: 2017-03-25 — End: 2017-03-25

## 2017-03-25 MED ORDER — HEPARIN (PORCINE) 5,000 UNIT/ML IJ SOLN
5000 unit/mL | Freq: Two times a day (BID) | INTRAMUSCULAR | Status: DC
Start: 2017-03-25 — End: 2017-03-25
  Administered 2017-03-25 (×2): via SUBCUTANEOUS

## 2017-03-25 MED ORDER — SPIRONOLACTONE 25 MG TAB
25 mg | Freq: Every day | ORAL | Status: DC
Start: 2017-03-25 — End: 2017-03-24

## 2017-03-25 MED ORDER — MORPHINE 2 MG/ML INJECTION
2 mg/mL | INTRAMUSCULAR | Status: DC | PRN
Start: 2017-03-25 — End: 2017-03-24

## 2017-03-25 MED ORDER — HYDROMORPHONE (PF) 1 MG/ML IJ SOLN
1 mg/mL | Freq: Once | INTRAMUSCULAR | Status: AC
Start: 2017-03-25 — End: 2017-03-24
  Administered 2017-03-25: 01:00:00 via INTRAVENOUS

## 2017-03-25 MED ORDER — ESTRADIOL 1 MG TAB
1 mg | Freq: Every day | ORAL | Status: DC
Start: 2017-03-25 — End: 2017-03-25
  Administered 2017-03-25: 14:00:00 via ORAL

## 2017-03-25 MED ORDER — GABAPENTIN 100 MG CAP
100 mg | Freq: Three times a day (TID) | ORAL | Status: DC
Start: 2017-03-25 — End: 2017-03-25

## 2017-03-25 MED ORDER — ALUMINUM-MAGNESIUM HYDROXIDE 200 MG-200 MG/5 ML ORAL SUSP
200-200 mg/5 mL | Freq: Four times a day (QID) | ORAL | Status: DC | PRN
Start: 2017-03-25 — End: 2017-03-25

## 2017-03-25 MED ORDER — CITALOPRAM 20 MG TAB
20 mg | Freq: Every day | ORAL | Status: DC
Start: 2017-03-25 — End: 2017-03-25
  Administered 2017-03-25: 14:00:00 via ORAL

## 2017-03-25 MED ORDER — SPIRONOLACTONE 25 MG TAB
25 mg | Freq: Two times a day (BID) | ORAL | Status: DC
Start: 2017-03-25 — End: 2017-03-25
  Administered 2017-03-25 (×2): via ORAL

## 2017-03-25 MED ORDER — CLONAZEPAM 1 MG TAB
1 mg | Freq: Two times a day (BID) | ORAL | Status: DC
Start: 2017-03-25 — End: 2017-03-25
  Administered 2017-03-25 (×2): via ORAL

## 2017-03-25 MED ORDER — HYDROMORPHONE (PF) 1 MG/ML IJ SOLN
1 mg/mL | INTRAMUSCULAR | Status: DC | PRN
Start: 2017-03-25 — End: 2017-03-25
  Administered 2017-03-25 (×5): via INTRAVENOUS

## 2017-03-25 MED ORDER — ACETAMINOPHEN 325 MG TABLET
325 mg | Freq: Four times a day (QID) | ORAL | Status: DC
Start: 2017-03-25 — End: 2017-03-25
  Administered 2017-03-25: 18:00:00 via ORAL

## 2017-03-25 MED ORDER — LIDOCAINE 5 % (700 MG/PATCH) ADHESIVE PATCH
5 % | CUTANEOUS | Status: DC
Start: 2017-03-25 — End: 2017-03-25

## 2017-03-25 MED ORDER — MORPHINE 4 MG/ML INTRAVENOUS SOLUTION
4 mg/mL | Freq: Once | INTRAVENOUS | Status: DC
Start: 2017-03-25 — End: 2017-03-24

## 2017-03-25 MED ORDER — NICOTINE 21 MG/24 HR DAILY PATCH
21 mg/24 hr | TRANSDERMAL | Status: AC
Start: 2017-03-25 — End: 2017-03-25
  Administered 2017-03-25: 07:00:00

## 2017-03-25 MED ORDER — ACETAMINOPHEN 325 MG TABLET
325 mg | Freq: Four times a day (QID) | ORAL | Status: DC | PRN
Start: 2017-03-25 — End: 2017-03-25

## 2017-03-25 MED ORDER — NICOTINE 21 MG/24 HR DAILY PATCH
21 mg/24 hr | Freq: Every day | TRANSDERMAL | Status: DC
Start: 2017-03-25 — End: 2017-03-25

## 2017-03-25 MED FILL — HYDROMORPHONE (PF) 1 MG/ML IJ SOLN: 1 mg/mL | INTRAMUSCULAR | Qty: 1

## 2017-03-25 MED FILL — ESTRADIOL 1 MG TAB: 1 mg | ORAL | Qty: 2

## 2017-03-25 MED FILL — ACETAMINOPHEN 325 MG TABLET: 325 mg | ORAL | Qty: 2

## 2017-03-25 MED FILL — NORMAL SALINE FLUSH 0.9 % INJECTION SYRINGE: INTRAMUSCULAR | Qty: 10

## 2017-03-25 MED FILL — SPIRONOLACTONE 25 MG TAB: 25 mg | ORAL | Qty: 2

## 2017-03-25 MED FILL — HEPARIN (PORCINE) 5,000 UNIT/ML IJ SOLN: 5000 unit/mL | INTRAMUSCULAR | Qty: 1

## 2017-03-25 MED FILL — CLONAZEPAM 1 MG TAB: 1 mg | ORAL | Qty: 1

## 2017-03-25 MED FILL — CITALOPRAM 20 MG TAB: 20 mg | ORAL | Qty: 1

## 2017-03-25 MED FILL — LIDOCAINE 5 % (700 MG/PATCH) ADHESIVE PATCH: 5 % | CUTANEOUS | Qty: 1

## 2017-03-25 MED FILL — NORMAL SALINE FLUSH 0.9 % INJECTION SYRINGE: INTRAMUSCULAR | Qty: 20

## 2017-03-25 MED FILL — NICOTINE 21 MG/24 HR DAILY PATCH: 21 mg/24 hr | TRANSDERMAL | Qty: 1

## 2017-03-25 NOTE — Progress Notes (Addendum)
Problem: Self Care Deficits Care Plan (Adult)  Goal: *Acute Goals and Plan of Care (Insert Text)  Occupational Therapy Goals  Initiated 03/25/2017  1.  Patient will perform grooming standing at sink with modified independence within 7 day(s).  2.  Patient will perform lower body dressing with modified independence within 7 day(s).  3.  Patient will perform bathing with modified independence within 7 day(s).  4.  Patient will perform toilet transfers with modified independence within 7 day(s).  5.  Patient will perform all aspects of toileting with modified independence within 7 day(s).    Occupational Therapy EVALUATION  Patient: Rodney Arnold (32 y.o. male)  Date: 03/25/2017  Primary Diagnosis: Back pain [M54.9]       Precautions: none       ASSESSMENT :  Based on the objective data described below, the patient presents with Setup upper body ADLs, Contact guard assistance to Maximum assistance lower body ADLs, and Contact guard assistance for sit-stand and ambulating ~20 feet.  Patient with inconsistent functional performance (reports she is unable to move BLE, demonstrates no BLE AROM during formal testing, but ambulates with CGA).  Patient reports complete BLE numbness to light and pressure distal to waist.  Reports severe back pain, limiting LB access/ ADL independence, and requested pain medication throughout session.  Noted concern for possible drug seeking behavior per chart review.  Patient's current performance is below functional baseline (patient with history of CP but reports she is fully independent with ADLs, ambulatory with no AD at baseline, does endorse 4 falls in past year).     The following are barriers to independence while in acute care:   - Cognitive and/or behavioral: Possible drug seeking behavior per chart review, inconsistent functional performance  - Medical condition: reports severe back pain, BLE numbness, and impaired gait     Patient will benefit from skilled acute intervention to address the above impairments.  Patient???s rehabilitation potential is considered to be Good     Discharge recommendations: Patient declined rehab, reports she has 24/7 supervision/ assistance between husband and room mates, therefore recommend d/c home with assistance when medically stable and home health vs no f/u OT needs pending progress       PLAN :  Recommendations and Planned Interventions: self care training, functional mobility training, therapeutic exercise, balance training, endurance activities, patient education, home safety training and family training/education    Frequency/Duration: Patient will be followed by occupational therapy 3 times a week to address goals.     SUBJECTIVE:   Patient stated ???My back hurts so much.???    OBJECTIVE DATA SUMMARY:   HISTORY:   Past Medical History:   Diagnosis Date   ??? Asthma due to environmental allergies    ??? Cat allergies    ??? Cerebral palsy ([facility])      Past Surgical History:   Procedure Laterality Date   ??? HX ORTHOPAEDIC  1998    quadricep lengthening surgery       Prior Level of Function/Environment/Context: patient with history of CP but reports she is fully independent with ADLs, ambulatory with no AD at baseline, does endorse 4 falls in past year.   Expanded or extensive additional review of patient history:     Home Situation  Home Environment: Private residence  # Steps to Enter: 4  Rails to Enter: No  One/Two Story Residence: Two Nutritional therapist of Interior Steps: 18  Height of Each Step (in): 6 inches  Interior Rails: Left  Lift  Chair Available: No  Living Alone: No  Support Systems: Friends \\ neighbors, Spouse/Significant Other/Partner(lives with husband and 3 roommates)  Patient Expects to be Discharged to:: Private residence  Current DME Used/Available at Home: (states loft strand crutches ordered but not received)  EXAMINATION OF PERFORMANCE DEFICITS:  Cognitive/Behavioral Status:                     Skin: visible skin appears intact    Edema: none noted    Hearing:  Auditory  Auditory Impairment: None  Vision/Perceptual:                           Acuity: Within Defined Limits       Range of Motion:    AROM: Generally decreased, functional (BLE during mobility but no strength/ AROM during formal testing.  BUE WFL)                       Strength:    Strength: Generally decreased, functional (BLE during mobility but no strength/ AROM during formal testing.  BUE WFL)              Coordination:  Coordination: Generally decreased, functional  Fine Motor Skills-Upper: Left Intact;Right Intact    Gross Motor Skills-Upper: Left Intact;Right Intact  Tone & Sensation:    Tone: Abnormal(increased adductor tone R>L)  Sensation: Impaired(pt reports numbness bilat LEs)                    Balance:  Sitting: Intact  Standing: Impaired;With support  Standing - Static: Good;Constant support  Standing - Dynamic : Fair  Functional Mobility and Transfers for ADLs:Bed Mobility:  Supine to Sit: Supervision  Sit to Supine: Supervision    Transfers:  Sit to Stand: Contact guard assistance  Stand to Sit: Contact guard assistance  Toilet Transfer : Contact guard assistance(inferred)    ADL Assessment:  Feeding: Independent(inferred)    Oral Facial Hygiene/Grooming: Contact guard assistance(inferred standing)    Bathing: Moderate assistance(inferred due to back pain, impaired LB access)         Lower Body Dressing: Maximum assistance(inferred for impaired LB access, back pain)    Toileting: Contact guard assistance(inferred)                            Functional Measure:  Barthel Index:    Bathing: 0  Bladder: 10  Bowels: 10  Grooming: 5  Dressing: 5  Feeding: 10  Mobility: 0  Stairs: 0  Toilet Use: 5  Transfer (Bed to Chair and Back): 10  Total: 55/100       Percentage of impairment   0%   1-19%   20-39%   40-59%   60-79%   80-99%   100%   Barthel Score 0-100 100 99-80 79-60 59-40 20-39 1-19   0     The Barthel ADL Index: Guidelines   1. The index should be used as a record of what a patient does, not as a record of what a patient could do.  2. The main aim is to establish degree of independence from any help, physical or verbal, however minor and for whatever reason.  3. The need for supervision renders the patient not independent.  4. A patient's performance should be established using the best available evidence. Asking the patient, friends/relatives and nurses are the usual sources, but direct  observation and common sense are also important. However direct testing is not needed.  5. Usually the patient's performance over the preceding 24-48 hours is important, but occasionally longer periods will be relevant.  6. Middle categories imply that the patient supplies over 50 per cent of the effort.  7. Use of aids to be independent is allowed.    Clarisa KindredMahoney, F.l., Barthel, D.W. [phone](1965). Functional evaluation: the Barthel Index. Md State Med J (14)2.  Zenaida NieceVan der Purple SagePutten, J.J.M.F, BagleyHobart, Ian MalkinJ.C., Margret ChanceFreeman, J.A., Marcol[place]ompson, [place].J. (1999). Measuring the change indisability after inpatient rehabilitation; comparison of the responsiveness of the Barthel Index and Functional Independence Measure. Journal of Neurology, Neurosurgery, and Psychiatry, 66(4), [phone].  Dawson BillsVan Exel, N.J.A, Scholte op AmarilloReimer,  W.J.M, & Koopmanschap, M.A. (2004.) Assessment of post-stroke quality of life in cost-effectiveness studies: The usefulness of the Barthel Index and the EuroQoL-5D. Quality of Life Research, 7213, [phone]       Occupational Therapy Evaluation Charge Determination   History Examination Decision-Making   LOW Complexity : Brief history review  MEDIUM Complexity : 3-5 performance deficits relating to physical, cognitive , or psychosocial skils that result in activity limitations and / or participation restrictions MEDIUM Complexity : Patient may present with comorbidities that affect occupational performnce. Miniml to moderate modification of tasks or  assistance (eg, physical or verbal ) with assesment(s) is necessary to enable patient to complete evaluation       Based on the above components, the patient evaluation is determined to be of the following complexity level: LOW   Pain:  Patient reports severe back pain with any activity, requested pain meds, RN notified    Activity Tolerance:   VSS    After treatment patient left:   Supine in bed  Call light within reach  RN notified   COMMUNICATION/EDUCATION:   The patient???s plan of care was discussed with: Physical Therapist and Registered Nurse.    Home safety education was provided and the patient/caregiver indicated understanding., Patient/family have participated as able in goal setting and plan of care. and Patient/family agree to work toward stated goals and plan of care.    This patient???s plan of care is appropriate for delegation to OTA.    Thank you for this referral.  Awilda Metroameron Williams, OT  Time Calculation: 15 mins

## 2017-03-25 NOTE — Progress Notes (Signed)
Problem: Mobility Impaired (Adult and Pediatric)  Goal: *Acute Goals and Plan of Care (Insert Text)  Physical Therapy Goals  Initiated 03/25/2017  1.  Patient will move from supine to sit and sit to supine  in bed with supervision/set-up within 7 day(s).    2.  Patient will transfer from bed to chair and chair to bed with supervision/set-up using the least restrictive device within 7 day(s).  3.  Patient will perform sit to stand with supervision/set-up within 7 day(s).  4.  Patient will ambulate with supervision/set-up for at least 50 feet with the least restrictive device within 7 day(s).   5.  Patient will ascend/descend 18 stairs with L handrail(s) with minimal assistance/contact guard assist within 7 day(s).  physical Therapy EVALUATION  Patient: Rodney Arnold (32 y.o. male)  Date: 03/25/2017  Primary Diagnosis: Back pain [M54.9]       Precautions: fall, preferred name is Rodney Arnold; use of feminine pronouns       ASSESSMENT :  Based on the objective data described below, the patient presents with decreased function with mobility due to reported numbness/ weakness bilat LEs s/p fall at home.  MRI showed disc bulge L2-3.  Of note, pt denies ability to sense light touch or deep pressure LEs, unable to move LEs to command with attempted MMT; however pt able to mobilize OOB with min A overall. Pt's mother present during session via face time and notes that pt will become very upset if not receiving appropriate meds for anxiety and pain.    Pt completed bed mob supine<->sit with supervision, transferred sit<->stand with min A, sidestepped along bed with min HHA for balance.      Pt PLOF includes indep functional mobility without use of AD, negotiating 2 level home where husband and roommate available to assist if needed, h/o falls. Pt reports loft strand crutches ordered by outpt PT ~6 mos ago; states that crutches delivered to [place], [place] and pt is unable to make  arrangements to obtain. Pt now presents below baseline LOF. States preference for return home, not open to rehab. Recommend d/c to home with caregiver assist and follow up [facility] PT pending progress.    Patient will benefit from skilled intervention to address the above impairments.  Patient???s rehabilitation potential is considered to be Good  Factors which may influence rehabilitation potential include:   []          None noted  []          Mental ability/status  []          Medical condition  []          Home/family situation and support systems  []          Safety awareness  []          Pain tolerance/management  [x]          Other: findings of inconsistent LE function on exam     PLAN :  Recommendations and Planned Interventions:  [x]            Bed Mobility Training             [x]     Neuromuscular Re-Education  [x]            Transfer Training                   []     Orthotic/Prosthetic Training  [x]            Gait Training                         []   Modalities  [x]            Therapeutic Exercises           []     Edema Management/Control  [x]            Therapeutic Activities            [x]     Patient and Family Training/Education  []            Other (comment):    Frequency/Duration: Patient will be followed by physical therapy  5 times a week to address goals.  Discharge Recommendations: possible caregiver assist and [facility]  Further Equipment Recommendations for Discharge: TBD     SUBJECTIVE:   Patient stated ???you know I can't move my legs.???    OBJECTIVE DATA SUMMARY:   HISTORY:    Past Medical History:   Diagnosis Date   ??? Asthma due to environmental allergies    ??? Cat allergies    ??? Cerebral palsy (HCC)      Past Surgical History:   Procedure Laterality Date   ??? HX ORTHOPAEDIC  1998    quadricep lengthening surgery     Prior Level of Function/Home Situation: pt lives with husband and 3 roommates in 2 level home with 4 STE; denies DME at home. Indep with mobility with h/o falls PTA.   Personal factors and/or comorbidities impacting plan of care: anxiety     Home Situation  Home Environment: Private residence  # Steps to Enter: 4  Rails to Enter: No  One/Two Story Residence: Two Nutritional therapiststory  # of Interior Steps: 18  Height of Each Step (in): 6 inches  Interior Rails: Left  Lift Chair Available: No  Living Alone: No  Support Systems: Friends \\ neighbors, Futures traderpouse/Significant Other/Partner(lives with husband and 3 roommates)  Patient Expects to be Discharged to:: Private residence  Current DME Used/Available at Home: (states loft strand crutches ordered but not received)  EXAMINATION/PRESENTATION/DECISION MAKING: Critical Behavior:  Neurologic State: Alert  Orientation Level: Oriented X4  Cognition: Appropriate decision making     Hearing:  Auditory  Auditory Impairment: None  Range Of Motion:      AROM: generally decreased, functional (LEs)  PROM: decreased ankle DF to grossly neutral bilat                Decreased hip abd R>L                    Strength:  Generally decreased, functional (LEs)                       Tone & Sensation: increased adductor tone R>L                                                                       Coordination:  Generally decreased, functional        Functional Mobility:  Bed Mobility: supine<-> sit supervision              Transfers:sit to stand min A, stand to sit CGA                             Balance: sitting balance  intact; static stand good with bilat UE support, dynamic stand fair   Ambulation/Gait Training:Pt able to side step along bed with min A for balance, UE support                                                          Functional Measure:  Berg Balance Test:    Sitting to Standing: 1  Standing Unsupported: 0  Sitting with Back Unsupported: 4  Standing to Sitting: 0  Transfers: 1  Standing Unsupported with Eyes Closed: 0  Standing Unsupported with Feet Together: 0  Reach Forward with Outstretched Arm: 0  Pick Up Object: 0  Turn to Look Over Shoulders: 0   Turn 360 Degrees: 0  Alternate Foot on Step/Stool: 0  Standing Unsupported One Foot in Front: 0  Stand on One Leg: 0  Total: 6        56=Maximum possible score;   0-20=High fall risk  21-40=Moderate fall risk   41-56=Low fall risk              Physical Therapy Evaluation Charge Determination   History Examination Presentation Decision-Making   MEDIUM  Complexity : 1-2 comorbidities / personal factors will impact the outcome/ POC  LOW Complexity : 1-2 Standardized tests and measures addressing body structure, function, activity limitation and / or participation in recreation  LOW Complexity : Stable, uncomplicated  LOW Complexity : FOTO score of 75-100      Based on the above components, the patient evaluation is determined to be of the following complexity level: LOW     Pain:  Pt reported 15/10 pain in LEs and back; medicated at beginning of session  Activity Tolerance:   Pt reported severe pain, but no pain behaviors noted, pt able to tolerate brief mobility assessment   Please refer to the flowsheet for vital signs taken during this treatment.  After treatment:   []          Patient left in no apparent distress sitting up in chair  [x]          Patient left in no apparent distress in bed  [x]          Call bell left within reach  [x]          Nursing notified  []          Caregiver present  []          Bed alarm activated    COMMUNICATION/EDUCATION:   The patient???s plan of care was discussed with: Registered Nurse.  [x]          Fall prevention education was provided and the patient/caregiver indicated understanding.  [x]          Patient/family have participated as able in goal setting and plan of care.  [x]          Patient/family agree to work toward stated goals and plan of care.  []          Patient understands intent and goals of therapy, but is neutral about his/her participation.  []          Patient is unable to participate in goal setting and plan of care.    Thank you for this referral.  Marcelino Duster, PT    Time Calculation: 25 mins

## 2017-03-25 NOTE — Progress Notes (Signed)
Reason for Admission:   Back Pain                  RRAT Score:     14             Do you (patient/family) have any concerns for transition/discharge?     Patient is noted as having a hx of drug seeking, including a positive drug screening in the ED. Patient is on a Compliance Drug Screen/Prescription Monitoring Program with PCP, was found to be out of compliance in 01/2017, and has been referred to pain management.     Patient prefers to be called Rodney Arnold and have male pronouns used.  Patient is transgender and is currently taking hormone therapies since 05/2016.  Patient identified that they have done hormone therapy before (18 YOA - 21 YOA).      Patient was in [facility] for approximately 6 years and was released 05/2016.      CM asked patient if they have any DME and listed several items (walker, cane, wheelchair, bedside commode, raised toilet seat, shower stool, grab bars, CPAP, nebulizer, glucometer, BP cuff), and patient stated that they do not use any medical equipment.              Plan for utilizing home health:     TBD - patient has declined rehab services.  Therapy recommendations are currently pending progression.    Likelihood of readmission?   HIGH - patient is known for drug seeking.  Patient was [facility] at [facility] for a fall            Transition of Care Plan:  Patient is transgender and identifies self as Rodney Arnold, with preference of male pronouns to be used. Pharmacy preference identified as [facility].  Patient has no hx of HH, IPR, or SNF.  Patient notes that in 2013 she completed a rehab program through a [facility].  Patient stated that she has been followed by [facility] for approximately 8 months and has a [place] ([place]).  Patient stated that she tripped over her dog and had a ground level fall and cannot feel her legs at this time.  Patient stated that she, "has a broken back," and is in  excruciating pain.  Patient asked for additional pain medication for breakthrough pain.  CM discussed with attending physician, who clarified that patient's MRI came back clear.  Patient is being followed by PT and OT and is noted as having been able to get out of bed with assistance with PT but has declined rehab placement.  Patient has a neuro consult pending.         Care Management Interventions  PCP Verified by CM: Yes(last saw NP Rodney Arnold a couple weeks ago)  Palliative Care Criteria Met (RRAT>21 & CHF Dx)?: No  Mode of Transport at Discharge: Other (see comment)(Will likely require assistance with transport  home)  Transition of Care Consult (CM Consult): Discharge Planning  MyChart Signup: Yes  Discharge Durable Medical Equipment: No(Patient stated that she has no DME)  Health Maintenance Reviewed: Yes(CM met with patient, with patient alert and oriented x 4)  Physical Therapy Consult: Yes  Occupational Therapy Consult: Yes  Speech Therapy Consult: No  Current Support Network: Lives with Spouse, Other, Own Home(Lives with spouse and 3 other adult roommates)  Confirm Follow Up Transport: Other (see comment)(Has a Education officer, community that transports patient)  Plan  discussed with Pt/Family/Caregiver: Yes  [employer] Information Provided?: No  Discharge Location  Discharge Placement: Unable to determine at this time(Lives with spouse and 3 roommates in a 2 story home, 6 exterior steps, 2nd floor bedroom, 18 interior steps)    CRM: Rodney Arnold, MPH, [place]; Z: [phone]

## 2017-03-25 NOTE — Discharge Summary (Signed)
Discharge Summary       PATIENT ID: Rodney Arnold  MRN: [id number]   DATE OF BIRTH: [birth year]    DATE OF ADMISSION: 03/24/2017 12:08 PM    DATE OF DISCHARGE: 03/25/2017   PRIMARY CARE PROVIDER: Lorri Frederick, NP     ATTENDING PHYSICIAN: Purvis Sheffield, MD  DISCHARGING PROVIDER: Purvis Sheffield, MD    To contact this individual call [phone] and ask the operator to page.  If unavailable ask to be transferred the Adult Hospitalist Department.    CONSULTATIONS: IP CONSULT TO HOSPITALIST  IP CONSULT TO NEUROLOGY    PROCEDURES/SURGERIES: * No surgery found *    ADMITTING DIAGNOSES & HOSPITAL COURSE:   Admitted after a fall, c/o back pain and paralysis of lower exts, then was able walk on his legs, got frustrated as he didn't get more frequent doses of dilaudid IV, seen by PT/OT and nerulogy, refused rehab and refused to wait to get MRI T/L spine as recommended by Neurology. Left AGAINST MEDICAL ADVICE.    Risk of Re-Admission: high  DISCHARGE DIAGNOSES / PLAN:      #. Fall   #. lower extremity weakness/paralysis: possible malingering  - PT/OT eval recommended rehab, pt declined. MRI L spine w/o: NAAD. evaluated by Neurology team who recommended MRI with contrast of T/L spine, pt declined, I have counseled him on improtance of MRI to ruling out any potential spine inj, patient insisting on leaving AMA.   ??  #. Sex reassignment therapy - currently on estrace and spironolactone. ??Prefers to go by "Ms. Sames"  #. 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 ??Followed by dermatology  #. Cerebral palsy - uses crutches for gait assistance  #. Possible drug seeking behavior:   - UDS +ve for Benzo and Opiates in ED.   - reviewed pcp note on 02/16/2017 in epid. ??Pt referred to pain management by pcp who is not prescribing pain meds for pt.  - no further increase of opioid doses. Lidocaine patch, reg tylenol. Pt will persistently ask for more opioids     FOLLOW UP APPOINTMENTS:    Follow-up Information    None         ADDITIONAL CARE RECOMMENDATIONS:  Follow up with PCP and Neurology    DIET: Resume previous diet    ACTIVITY: Activity as tolerated    DISCHARGE MEDICATIONS:  Current Discharge Medication List          NOTIFY YOUR PHYSICIAN FOR ANY OF THE FOLLOWING:   Fever over 101 degrees for 24 hours.   Chest pain, shortness of breath, fever, chills, nausea, vomiting, diarrhea, change in mentation, falling, weakness, bleeding. Severe pain or pain not relieved by medications.  Or, any other signs or symptoms that you may have questions about.    DISPOSITION:    Home With:   OT  PT  HH  RN       Long term SNF/Inpatient Rehab   x Independent/assisted living    Hospice    Other:       PATIENT CONDITION AT DISCHARGE:     Functional status    Poor     Deconditioned     Independent      Cognition     Lucid     Forgetful     Dementia      Catheters/lines (plus indication)    Foley     PICC     PEG     None      Code  status     Full code     DNR      PHYSICAL EXAMINATION AT DISCHARGE:  Patient seen and examined at bedside, Condition stable, explained Neuro recommendations. Insisted and left AGAINST MEDICAL ADVICE  General: ??Alert, oriented, No acute distress  Resp: ??No accessory muscle use, Good AE.  Neuro: ??Grossly normal, no focal neuro deficits, follows commands   CHRONIC MEDICAL DIAGNOSES:  Problem List as of 03/25/2017 Date Reviewed: 01/26/2017          Codes Class Noted - Resolved    Lower extremity weakness ICD-10-CM: R29.898  ICD-9-CM: 729.89  03/25/2017 - Present        Back pain ICD-10-CM: M54.9  ICD-9-CM: 724.5  03/24/2017 - Present        Bipolar disorder in remission [facility](HCC) ICD-10-CM: F31.70  ICD-9-CM: 296.80  11/01/2016 - Present        Spastic diplegic cerebral palsy (HCC) ICD-10-CM: G80.1  ICD-9-CM: 343.0  10/15/2016 - Present        Family history of colon cancer in father ICD-10-CM: Z80.0  ICD-9-CM: V16.0  10/15/2016 - Present        FH: breast cancer in relative when <32 years old ICD-10-CM: Z80.3   ICD-9-CM: V16.3  10/15/2016 - Present              37 minutes were spent with the patient on counseling and coordination of care.    Signed:   Purvis SheffieldAsad Lavontae Cornia, MD  03/25/2017  4:28 PM

## 2017-03-25 NOTE — Consults (Signed)
NEUROLOGY CONSULT NOTE  Name Rodney Arnold Age 32 y.o.   MRN [id number] DOB [birth year]     Consulting Physician: Purvis Sheffield, MD      Chief Complaint:  LE weakness, numbness s/p fall     Assessment:     Active Problems:    Back pain (03/24/2017)      Lower extremity weakness (03/25/2017)      32 year old male with a h/o CP presenting s/p fall 03/24/17 with subsequent acute onset b/l LE paraparesis, sensory deficit, acute on chronic LBP.  No bowel/bladder involvement.  Some inconsistencies with current examination showing flaccid LE paralysis and reports of patient ambulating with PT earlier today.  Primary team concerned for drug seeking behavior.  Will obtain MRI T/L spine with contrast to exclude any cord pathology/cauda equina involvement.  Previous lumbar imaging reviewed but completed without contrast.  May attempt to limit narcotic use-trial of gabapentin 300-600mg  TID to address neuropathic pain symptoms.    Will F/U imaging once completed.  Recommendations:   MRI T/L w/contrast  May consider a trial of gabapentin for neuropathic pain symptoms    Thank you very much for this referral. I appreciate the opportunity to participate in this patient's care.       History of Present Illness:      This is a 32 y.o. Caucasian left handed  male, we were asked to see for LE weakness, numbness s/p fall.   Patient reports he tripped on 03/24/17 and landed on his R lower back/side.  Since his fall, he endorses pain of the mid/lower spine with associated burning/stabbing extending to the proximal anterior thighs.  [name] denies bowel/bladder incontinence or retention.  He states he cannot feel anything from his mid-waist down to his feet.  He also states he cannot move his legs, although later states he ambulated with PT assistance.  He denies similar symptoms in the past.  He admits to a h/o cerebral palsy with gait instability, using crutches to assist along  with chronic LBP issues.  There is concern raised by his primary team regarding drug seeking behavior.  He is followed by pain management but is reportedly not receiving opiates. UDS+ benzodiazepine and opiates on admission.  MRI Lumbar spine WO contrast reviewed from 03/24/17 showing no significant canal/NFS, absent spinous processes/lamina at multiple levels.  Patient reports prior lumbar surgery during childhood.      Allergies   Allergen Reactions   ??? Other Food Swelling     Grapes   ??? Codeine Anaphylaxis   ??? Penicillins Anaphylaxis   ??? Toradol [Ketorolac] Anaphylaxis   ??? Food Allergy Formula [Glutamine-C-Quercet-Selen-Brom] Swelling     Peanuts grapes and pineapples   ??? Peanut Swelling   ??? Pineapple Swelling   ??? Tramadol Rash and Swelling        Prior to Admission medications    Medication Sig Start Date End Date Taking? Authorizing Provider   FLUoxetine (PROZAC) 40 mg capsule Take  by mouth daily.   Yes Provider, Historical   clonazePAM (KLONOPIN) 1 mg tablet Take  by mouth two (2) times a day.   Yes Provider, Historical   estradiol (ESTRACE) 2 mg tablet take 1 tablet by mouth once daily 10/05/16  Yes Provider, Historical   spironolactone (ALDACTONE) 25 mg tablet take 2 tablets by mouth twice a day 10/05/16  Yes Provider, Historical   citalopram (CELEXA) 20 mg tablet Take  by mouth daily.   Yes Provider, Historical  Past Medical History:   Diagnosis Date   ??? Asthma due to environmental allergies    ??? Cat allergies    ??? Cerebral palsy (HCC)         Past Surgical History:   Procedure Laterality Date   ??? HX ORTHOPAEDIC  1998    quadricep lengthening surgery        Social History     Tobacco Use   ??? Smoking status: Current Every Day Smoker   ??? Smokeless tobacco: Never Used   Substance Use Topics   ??? Alcohol use: Yes     Comment: very rarely        Family History   Problem Relation Age of Onset   ??? Stroke Mother    ??? Psychiatric Disorder Mother    ??? Breast Cancer Mother    ??? Alcohol abuse Mother     ??? Hypertension Mother    ??? Colon Cancer Father    ??? Alcohol abuse Father    ??? Heart Disease Father    ??? Hypertension Father    ??? Diabetes Father    ??? Heart Failure Father    ??? Psychiatric Disorder Father    ??? Prostate Cancer Paternal Uncle         Review of Systems:   Comprehensive review of systems performed and negative except for as listed above.     Exam:     Visit Vitals  BP 121/80 (BP 1 Location: Right arm, BP Patient Position: At rest)   Pulse 68   Temp 98.5 ??F (36.9 ??C)   Resp 18   Ht 5\' 6"  (1.676 m)   Wt 49.9 kg (110 lb)   SpO2 99%   BMI 17.75 kg/m??        General: Well developed, well nourished. Patient in no apparent distress   Head: Normocephalic, atraumatic, anicteric sclera   Lungs:  Clear to auscultation bilaterally, No wheezes or rubs   Cardiac: Regular rate and rhythm with no murmurs.   Abd: Bowel sounds were audible. No tenderness on palpation   Ext: No pedal edema   Skin: No overt signs of rash     Neurological Exam:  Mental Status: Alert and oriented to person place and time   Speech: Fluent no aphasia or dysarthria.   Cranial Nerves:   Intact visual fields.  Facial sensation is normal. Facial movement is symmetric.  Palate is midline.  Normal sternocleidomastoid strength. Tongue is midline. Hearing is intact bilaterally.   Eyes: PERRL, EOM's full, no nystagmus, no ptosis.   Motor:  UE 5/5 b/l, LE 1/5 proximal, 0/5 distal LE b/l    Reflexes:   Deep tendon reflexes 2+ UE b/l, 0/4 LE b/l.  Plantar response is equivocal L, down R   Sensory:   Absent vibration/proprioception/LT/temperature/PP distal LE throughout   Gait:  Gait is deferred   Tremor:   No tremor noted.   Cerebellar:  No ataxia on FTN   Neurovascular: No carotid bruits.        Imaging  MRI Results (maximum last 3):  Results from Hospital Encounter encounter on 03/24/17   MRI LUMB SPINE W WO CONT    Narrative *PRELIMINARY REPORT*    No acute abnormality. Mild disc bulges.     Preliminary report was provided by Dr. Belva Chimes, the on-call radiologist, at 1753  hours.    Final report to follow.    *END PRELIMINARY REPORT*    CLINICAL HISTORY: Severe low back pain.   INDICATION:  Severe low back pain    COMPARISON: None    TECHNIQUE: MR imaging of the lumbar spine was performed with sagittal T1, T2,  STIR;  axial T1, T2.  Contrast was not administered.    FINDINGS:    No surgical history is reported. The spinous processes are absent at multiple  levels.. Vertebral body heights are maintained. Marrow signal is normal.  The  conus medullaris terminates at L1.     L1/2:  The spinal canal and neuroforamina are widely patent.    L2/3:  Disc bulge. Canal and foramina are patent.    L3/4:  Absent spinous process canal and foramina are patent..    L4/5:  Absent spinous process, hypoplastic lamina. Canal is patent. Foramina are  patent.     L5/S1:  Absent spinous process and lamina. Canal and foramina are patent..    The visualized musculature and intraperitoneal structures are within normal  limits      Impression IMPRESSION:  No acute fracture or dislocation.   No significant canal or foraminal stenosis.  No acute process.  Absent spinous processes and lamina as as described above. There is no prior  surgical history reported. 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 Clinical correlation.                  Lab Review  Lab Results   Component Value Date/Time    WBC 6.7 03/25/2017 03:32 AM    HCT 44.5 03/25/2017 03:32 AM    HGB 14.3 03/25/2017 03:32 AM    PLATELET 167 03/25/2017 03:32 AM     Lab Results   Component Value Date/Time    Sodium 137 03/25/2017 03:32 AM    Potassium 4.1 03/25/2017 03:32 AM    Chloride 103 03/25/2017 03:32 AM    CO2 27 03/25/2017 03:32 AM    Glucose 92 03/25/2017 03:32 AM    BUN 10 03/25/2017 03:32 AM    Creatinine 0.79 03/25/2017 03:32 AM    Calcium 8.4 (L) 03/25/2017 03:32 AM     No components found for: TROPQUANT   No results found for: ANA    Signed:  Tonny Branchachel M. Lurena NidaDonaldson, DO  03/25/2017  3:40 PM

## 2017-03-25 NOTE — Telephone Encounter (Signed)
Called and spoke with patient after confirming patient identifiers, patient states that she has been hospitalized after a fall and now she can't feel her legs. Patient denies any further questions, concerns or needs. Well wishes made to the patient. End of encounter.

## 2017-03-25 NOTE — Progress Notes (Signed)
Hospitalist Progress Note  Purvis Sheffield, MD  Answering service: [phone] OR [phone] from in house phone      Date of Service:  03/25/2017  NAME:  Rodney Arnold  DOB:  03-24-1985  MRN:  [id number]    Admission Summary:   30M hx of Cerebral palsy, uses crutches for mobility, p/w fall.  Interval history / Subjective:   Patient seen and examined at bedside, states in lots of pain in back asking for more frequent or higher doses of dilaudid, or norco in between, states lower ext feel numb and can't  Move them at all, likley malingering to get opioids.     Assessment & Plan:     #. Fall with lower extremity weakness   - PT/OT eval. MRI: NAAD. Pt says she can't move her lower extremities at all, but was able to mobilise with assistance with pt, says not able to move legs now though. Likely Malingering/drug seeking, however due to insistence on not able to move legs will get neuro consult to affirm our diagnosis and help with dc planning.  ??  #. Sex reassignment therapy - currently on estrace and spironolactone.  Prefers to go by "[name]"  #. 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  Followed by dermatology  #. Cerebral palsy - uses crutches for gait assistance  #. Possible drug seeking behavior:   - UDS +ve for Benzo and Opiates in ED.   - reviewed pcp note on 02/16/2017 in epid.  Pt referred to pain management by pcp who is not prescribing pain meds for pt.  - no further increase of opioid doses. Lidocaine patch, reg tylenol. Pt will persistently ask for mor opioids    Surrogate Decision Maker: Rodney Arnold [phone]??  Baseline: uses crutches at home    Code status: Full  DVT prophylaxis: Heparin SQ  Care Plan discussed with: Patient/Family and Nurse  Disposition: TBD     Hospital Problems  Date Reviewed: 2017/02/20          Codes Class Noted POA    Back pain ICD-10-CM: M54.9  ICD-9-CM: 724.5  03/24/2017 Unknown            Review of Systems:    Pertinent items are mentioned in interval history.    Vital Signs:    Last 24hrs VS reviewed since prior progress note. Most recent are:  Visit Vitals  BP 136/63 (BP 1 Location: Right arm, BP Patient Position: At rest)   Pulse 83   Temp 98.2 ??F (36.8 ??C)   Resp 16   Ht 5\' 6"  (1.676 m)   Wt 49.9 kg (110 lb)   SpO2 97%   BMI 17.75 kg/m??     No intake or output data in the 24 hours ending 03/25/17 1213     Physical Examination:   General:  Alert, oriented, No acute distress objectively  Card:  S1, S2 without murmurs, good peripheral perfusion  Resp:  No accessory muscle use, Good AE, no wheezes or rhonchi  Abd:  Soft, non-tender, non-distended, BS+  Extremities:  No cyanosis or clubbing, no significant edema  Neuro:  not moving lower ext at all, states they both feel numb  Psych:  fair insight, AAOx3, not agitated.    Data Review:    Review and/or order of clinical lab test  Review and/or order of tests in the radiology section of CPT  Review and/or order of tests in the medicine section of CPT  Labs:  Recent Labs     03/25/17  0332 03/24/17  1314   WBC 6.7 5.2   HGB 14.3 15.4   HCT 44.5 47.5   PLT 167 175     Recent Labs     03/25/17  0332 03/24/17  1314   NA 137 140   K 4.1 3.9   CL 103 105   CO2 27 30   BUN 10 13   CREA 0.79 0.79   GLU 92 92   CA 8.4* 9.3     Recent Labs     03/24/17  1314   SGOT 7*   ALT 10*   AP 76   TBILI 0.2   TP 7.1   ALB 3.9   GLOB 3.2     No results for input(s): INR, PTP, APTT in the last 72 hours.    No lab exists for component: INREXT   No results for input(s): FE, TIBC, PSAT, FERR in the last 72 hours.   No results found for: FOL, RBCF   No results for input(s): PH, PCO2, PO2 in the last 72 hours.  No results for input(s): CPK, CKNDX, TROIQ in the last 72 hours.    No lab exists for component: CPKMB  No results found for: CHOL, CHOLX, CHLST, CHOLV, HDL, LDL, LDLC, DLDLP, TGLX, TRIGL, TRIGP, CHHD, CHHDX  No results found for: GLUCPOC   No results found for: COLOR, APPRN, SPGRU, REFSG, PHU, PROTU, GLUCU, KETU, BILU, UROU, NITU, LEUKU, GLUKE, EPSU, BACTU, WBCU, RBCU, CASTS, UCRY  Medications Reviewed:     Current Facility-Administered Medications   Medication Dose Route Frequency   ??? nicotine (NICODERM CQ) 21 mg/24 hr patch 1 Patch  1 Patch TransDERmal DAILY   ??? nicotine (NICODERM CQ) 21 mg/24 hr patch       ??? sodium chloride (NS) flush 5-40 mL  5-40 mL IntraVENous Q8H   ??? sodium chloride (NS) flush 5-40 mL  5-40 mL IntraVENous PRN   ??? heparin (porcine) injection 5,000 Units  5,000 Units SubCUTAneous Q12H   ??? acetaminophen (TYLENOL) tablet 650 mg  650 mg Oral Q6H PRN   ??? ondansetron (ZOFRAN) injection 4 mg  4 mg IntraVENous Q4H PRN   ??? aluminum-magnesium hydroxide (MAALOX) oral suspension 15 mL  15 mL Oral QID PRN   ??? citalopram (CELEXA) tablet 20 mg  20 mg Oral DAILY   ??? clonazePAM (KlonoPIN) tablet 1 mg  1 mg Oral BID   ??? estradiol (ESTRACE) tablet 2 mg  2 mg Oral DAILY   ??? HYDROmorphone (PF) (DILAUDID) injection 1 mg  1 mg IntraVENous Q4H PRN   ??? spironolactone (ALDACTONE) tablet 50 mg  50 mg Oral BID   ______________________________________________________________________  EXPECTED LENGTH OF STAY: 2d 14h  ACTUAL LENGTH OF STAY:          1               Purvis SheffieldAsad Nahum Sherrer, MD

## 2017-03-25 NOTE — Consults (Signed)
NEUROLOGY CONSULT NOTE    Name Rodney Arnold Age 32 y.o.   MRN [id number] DOB February 11, 1985     Consulting Physician: Purvis Sheffield, MD      Chief Complaint:  LE weakness, numbness s/p fall     Assessment:     Active Problems:    Back pain (03/24/2017)      Lower extremity weakness (03/25/2017)      32 year old male with a h/o CP presenting s/p fall 03/24/17 with subsequent acute onset b/l LE paraparesis, sensory deficit, acute on chronic LBP.  No bowel/bladder involvement.  Some inconsistencies with current examination showing flaccid LE paralysis and reports of patient ambulating with PT earlier today.  Primary team concerned for drug seeking behavior.  Will obtain MRI T/L spine with contrast to exclude any cord pathology/cauda equina involvement.  Previous lumbar imaging reviewed but completed without contrast.  May attempt to limit narcotic use-trial of gabapentin 300-600mg  TID to address neuropathic pain symptoms.    Will F/U imaging once completed.  Recommendations:   MRI T/L w/contrast  May consider a trial of gabapentin for neuropathic pain symptoms    Thank you very much for this referral. I appreciate the opportunity to participate in this patient's care.       History of Present Illness:      This is a 32 y.o. Caucasian left handed  male, we were asked to see for LE weakness, numbness s/p fall.   Patient reports he tripped on 03/24/17 and landed on his R lower back/side.  Since his fall, he endorses pain of the mid/lower spine with associated burning/stabbing extending to the proximal anterior thighs.  [name] denies bowel/bladder incontinence or retention.  He states he cannot feel anything from his mid-waist down to his feet.  He also states he cannot move his legs, although later states he ambulated with PT assistance.  He denies similar symptoms in the past.  He admits to a h/o cerebral palsy with gait instability, using crutches to assist along with chronic LBP issues.  There is concern raised by his  primary team regarding drug seeking behavior.  He is followed by pain management but is reportedly not receiving opiates. UDS+ benzodiazepine and opiates on admission.  MRI Lumbar spine WO contrast reviewed from 03/24/17 showing no significant canal/NFS, absent spinous processes/lamina at multiple levels.  Patient reports prior lumbar surgery during childhood.      Allergies   Allergen Reactions   ??? Other Food Swelling     Grapes   ??? Codeine Anaphylaxis   ??? Penicillins Anaphylaxis   ??? Toradol [Ketorolac] Anaphylaxis   ??? Food Allergy Formula [Glutamine-C-Quercet-Selen-Brom] Swelling     Peanuts grapes and pineapples   ??? Peanut Swelling   ??? Pineapple Swelling   ??? Tramadol Rash and Swelling        Prior to Admission medications    Medication Sig Start Date End Date Taking? Authorizing Provider   FLUoxetine (PROZAC) 40 mg capsule Take  by mouth daily.   Yes Provider, Historical   clonazePAM (KLONOPIN) 1 mg tablet Take  by mouth two (2) times a day.   Yes Provider, Historical   estradiol (ESTRACE) 2 mg tablet take 1 tablet by mouth once daily 10/05/16  Yes Provider, Historical   spironolactone (ALDACTONE) 25 mg tablet take 2 tablets by mouth twice a day 10/05/16  Yes Provider, Historical   citalopram (CELEXA) 20 mg tablet Take  by mouth daily.   Yes Provider, Historical  Past Medical History:   Diagnosis Date   ??? Asthma due to environmental allergies    ??? Cat allergies    ??? Cerebral palsy (HCC)         Past Surgical History:   Procedure Laterality Date   ??? HX ORTHOPAEDIC  1998    quadricep lengthening surgery        Social History     Tobacco Use   ??? Smoking status: Current Every Day Smoker   ??? Smokeless tobacco: Never Used   Substance Use Topics   ??? Alcohol use: Yes     Comment: very rarely        Family History   Problem Relation Age of Onset   ??? Stroke Mother    ??? Psychiatric Disorder Mother    ??? Breast Cancer Mother    ??? Alcohol abuse Mother    ??? Hypertension Mother    ??? Colon Cancer Father    ??? Alcohol abuse Father     ??? Heart Disease Father    ??? Hypertension Father    ??? Diabetes Father    ??? Heart Failure Father    ??? Psychiatric Disorder Father    ??? Prostate Cancer Paternal Uncle         Review of Systems:   Comprehensive review of systems performed and negative except for as listed above.     Exam:     Visit Vitals  BP 121/80 (BP 1 Location: Right arm, BP Patient Position: At rest)   Pulse 68   Temp 98.5 ??F (36.9 ??C)   Resp 18   Ht 5\' 6"  (1.676 m)   Wt 49.9 kg (110 lb)   SpO2 99%   BMI 17.75 kg/m??        General: Well developed, well nourished. Patient in no apparent distress   Head: Normocephalic, atraumatic, anicteric sclera   Lungs:  Clear to auscultation bilaterally, No wheezes or rubs   Cardiac: Regular rate and rhythm with no murmurs.   Abd: Bowel sounds were audible. No tenderness on palpation   Ext: No pedal edema   Skin: No overt signs of rash     Neurological Exam:  Mental Status: Alert and oriented to person place and time   Speech: Fluent no aphasia or dysarthria.   Cranial Nerves:   Intact visual fields.  Facial sensation is normal. Facial movement is symmetric.  Palate is midline.  Normal sternocleidomastoid strength. Tongue is midline. Hearing is intact bilaterally.   Eyes: PERRL, EOM's full, no nystagmus, no ptosis.   Motor:  UE 5/5 b/l, LE 1/5 proximal, 0/5 distal LE b/l    Reflexes:   Deep tendon reflexes 2+ UE b/l, 0/4 LE b/l.  Plantar response is equivocal L, down R   Sensory:   Absent vibration/proprioception/LT/temperature/PP distal LE throughout   Gait:  Gait is deferred   Tremor:   No tremor noted.   Cerebellar:  No ataxia on FTN   Neurovascular: No carotid bruits.        Imaging  MRI Results (maximum last 3):  Results from Hospital Encounter encounter on 03/24/17   MRI LUMB SPINE W WO CONT    Narrative *PRELIMINARY REPORT*    No acute abnormality. Mild disc bulges.    Preliminary report was provided by Dr. Belva ChimesSzucs, the on-call radiologist, at 1753  hours.    Final report to follow.    *END PRELIMINARY  REPORT*    CLINICAL HISTORY: Severe low back pain.   INDICATION:  Severe low back pain    COMPARISON: None    TECHNIQUE: MR imaging of the lumbar spine was performed with sagittal T1, T2,  STIR;  axial T1, T2.  Contrast was not administered.    FINDINGS:    No surgical history is reported. The spinous processes are absent at multiple  levels.. Vertebral body heights are maintained. Marrow signal is normal.  The  conus medullaris terminates at L1.     L1/2:  The spinal canal and neuroforamina are widely patent.    L2/3:  Disc bulge. Canal and foramina are patent.    L3/4:  Absent spinous process canal and foramina are patent..    L4/5:  Absent spinous process, hypoplastic lamina. Canal is patent. Foramina are  patent.     L5/S1:  Absent spinous process and lamina. Canal and foramina are patent..    The visualized musculature and intraperitoneal structures are within normal  limits      Impression IMPRESSION:  No acute fracture or dislocation.   No significant canal or foraminal stenosis.  No acute process.  Absent spinous processes and lamina as as described above. There is no prior  surgical history reported. 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 Clinical correlation.                  Lab Review  Lab Results   Component Value Date/Time    WBC 6.7 03/25/2017 03:32 AM    HCT 44.5 03/25/2017 03:32 AM    HGB 14.3 03/25/2017 03:32 AM    PLATELET 167 03/25/2017 03:32 AM     Lab Results   Component Value Date/Time    Sodium 137 03/25/2017 03:32 AM    Potassium 4.1 03/25/2017 03:32 AM    Chloride 103 03/25/2017 03:32 AM    CO2 27 03/25/2017 03:32 AM    Glucose 92 03/25/2017 03:32 AM    BUN 10 03/25/2017 03:32 AM    Creatinine 0.79 03/25/2017 03:32 AM    Calcium 8.4 (L) 03/25/2017 03:32 AM     No components found for: TROPQUANT  No results found for: ANA    Signed:  Tonny Branch. Lurena Nida, DO  03/25/2017  3:40 PM

## 2017-03-25 NOTE — Discharge Summary (Signed)
Discharge Summary by Purvis Sheffield, MD at 03/25/17 1628                Author: Purvis Sheffield, MD  Service: Internal Medicine  Author Type: Physician       Filed: 03/25/17 1633  Date of Service: 03/25/17 1628  Status: Signed          Editor: Purvis Sheffield, MD (Physician)                       Discharge Summary           PATIENT ID: Rodney Arnold   MRN: [id number]     DATE OF BIRTH: February 28, 1985      DATE OF ADMISSION: 03/24/2017 12:08 PM      DATE OF DISCHARGE:  03/25/2017    PRIMARY CARE PROVIDER:  Lorri Frederick, NP       ATTENDING PHYSICIAN: Purvis Sheffield, MD   DISCHARGING PROVIDER:  Purvis Sheffield, MD     To contact this individual call [phone] and ask the operator to page.  If unavailable ask to be transferred the Adult Hospitalist Department.      CONSULTATIONS: IP CONSULT TO HOSPITALIST   IP CONSULT TO NEUROLOGY      PROCEDURES/SURGERIES: * No surgery found *      ADMITTING DIAGNOSES & HOSPITAL  COURSE:    Admitted after a fall, c/o back pain and paralysis of lower exts, then was able walk on his legs, got frustrated as he didn't get more frequent doses of dilaudid IV, seen by PT/OT and nerulogy, refused  rehab and refused to wait to get MRI T/L spine as recommended by Neurology. Left AGAINST MEDICAL ADVICE.      Risk of Re-Admission: high     DISCHARGE DIAGNOSES / P LAN:           #. Fall    #. lower extremity weakness/paralysis: possible malingering   - PT/OT eval recommended rehab, pt declined. MRI L spine w/o: NAAD. evaluated by Neurology team who recommended MRI with contrast of T/L spine, pt declined, I have counseled him on improtance of MRI to ruling out any potential spine inj, patient insisting  on leaving AMA.    ??   #. Sex reassignment therapy - currently on estrace and spironolactone. ??Prefers to go by "Rodney Arnold"   #. 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 ??Followed by dermatology   #. Cerebral palsy - uses crutches for gait assistance   #. Possible drug  seeking behavior:    - UDS +ve for Benzo and Opiates in ED.    - reviewed pcp note on 02/16/2017 in epid. ??Pt referred to pain management by pcp who is not prescribing pain meds for pt.   - no further increase of opioid doses. Lidocaine patch, reg tylenol. Pt will persistently ask for more opioids        FOLLOW UP APPOINTMENTS:       Follow-up Information      None                 ADDITIONAL CARE RECOMMENDATIONS:   Follow up with PCP and Neurology      DIET: Resume previous diet      ACTIVITY: Activity as tolerated      DISCHARGE MEDICATIONS:     Current Discharge Medication List                  NOTIFY YOUR PHYSICIAN FOR ANY  OF THE FOLLOWING:    Fever over 101 degrees for 24 hours.    Chest pain, shortness of breath, fever, chills, nausea, vomiting, diarrhea, change in mentation, falling, weakness, bleeding. Severe pain or pain not relieved by medications.   Or, any other signs or symptoms that you may have questions about.      DISPOSITION:         Home With:     OT    PT    HH    RN                   Long term SNF/Inpatient Rehab     x  Independent/assisted living          Hospice          Other:           PATIENT CONDITION AT DISCHARGE:       Functional status         Poor        Deconditioned           Independent         Cognition          Lucid        Forgetful           Dementia         Catheters/lines (plus indication)         Foley        PICC        PEG           None         Code status          Full code           DNR         PHYSICAL EXAMINATION AT DISCHARGE:   Patient seen and examined at bedside, Condition stable, explained Neuro recommendations. Insisted and left AGAINST MEDICAL ADVICE   General: ??Alert, oriented, No acute distress   Resp: ??No accessory muscle use, Good AE.   Neuro: ??Grossly normal, no focal neuro deficits, follows commands    CHRONIC MEDICAL DIAGNOSES:      Problem List as of 03/25/2017  Date Reviewed:  2017/01/27                        Codes  Class  Noted - Resolved             Lower  extremity weakness  ICD-10-CM: R29.898   ICD-9-CM: 729.89    03/25/2017 - Present                       Back pain  ICD-10-CM: M54.9   ICD-9-CM: 724.5    03/24/2017 - Present                       Bipolar disorder in remission [facility])  ICD-10-CM: F31.70   ICD-9-CM: 296.80    11/01/2016 - Present                       Spastic diplegic cerebral palsy (HCC)  ICD-10-CM: G80.1   ICD-9-CM: 343.0    10/15/2016 - Present                       Family history of colon cancer in father  ICD-10-CM: Z80.0   ICD-9-CM: V16.0    10/15/2016 -  Present                       FH: breast cancer in relative when <303 years old  ICD-10-CM: Z80.3   ICD-9-CM: V16.3    10/15/2016 - Present                          37 minutes were spent with the patient on counseling and coordination of care.      Signed:    Purvis SheffieldAsad Jarred Purtee, MD   03/25/2017   4:28 PM

## 2017-03-25 NOTE — Other (Signed)
DOWNGRADED TO OBSERVATION - PLEASE FAX BACK OBS AUTH

## 2017-03-28 ENCOUNTER — Inpatient Hospital Stay
Admit: 2017-03-28 | Discharge: 2017-03-28 | Disposition: A | Discharge status: Home / Self Care | Payer: MEDICARE | Attending: Emergency Medicine

## 2017-03-28 ENCOUNTER — Observation Stay: Admit: 2017-03-28 | Payer: MEDICARE | Primary: Adolescent Medicine

## 2017-03-28 DIAGNOSIS — R531 Weakness: Secondary | ICD-10-CM

## 2017-03-28 LAB — CBC WITH AUTOMATED DIFF
ABS. BASOPHILS: 0.1 10*3/uL (ref 0.0–0.1)
ABS. EOSINOPHILS: 0.2 10*3/uL (ref 0.0–0.4)
ABS. IMM. GRANS.: 0 10*3/uL (ref 0.00–0.04)
ABS. LYMPHOCYTES: 1.5 10*3/uL (ref 0.8–3.5)
ABS. MONOCYTES: 0.5 10*3/uL (ref 0.0–1.0)
ABS. NEUTROPHILS: 4 10*3/uL (ref 1.8–8.0)
ABSOLUTE NRBC: 0 10*3/uL (ref 0.00–0.01)
BASOPHILS: 1 % (ref 0–1)
EOSINOPHILS: 2 % (ref 0–7)
HCT: 46.3 % (ref 36.6–50.3)
HGB: 14.8 g/dL (ref 12.1–17.0)
IMMATURE GRANULOCYTES: 0 % (ref 0.0–0.5)
LYMPHOCYTES: 25 % (ref 12–49)
MCH: 29 PG (ref 26.0–34.0)
MCHC: 32 g/dL (ref 30.0–36.5)
MCV: 90.8 FL (ref 80.0–99.0)
MONOCYTES: 8 % (ref 5–13)
MPV: 10.2 FL (ref 8.9–12.9)
NEUTROPHILS: 64 % (ref 32–75)
NRBC: 0 PER 100 WBC
PLATELET: 166 10*3/uL (ref 150–400)
RBC: 5.1 M/uL (ref 4.10–5.70)
RDW: 12.3 % (ref 11.5–14.5)
WBC: 6.2 10*3/uL (ref 4.1–11.1)

## 2017-03-28 LAB — METABOLIC PANEL, BASIC
Anion gap: 9 mmol/L (ref 5–15)
BUN/Creatinine ratio: 18 (ref 12–20)
BUN: 14 MG/DL (ref 6–20)
CO2: 30 mmol/L (ref 21–32)
Calcium: 9.1 MG/DL (ref 8.5–10.1)
Chloride: 99 mmol/L (ref 97–108)
Creatinine: 0.8 MG/DL (ref 0.70–1.30)
GFR est AA: >60 mL/min/{1.73_m2} (ref >60)
GFR est non-AA: >60 mL/min/{1.73_m2} (ref >60)
Glucose: 98 mg/dL (ref 65–100)
Potassium: 4.3 mmol/L (ref 3.5–5.1)
Sodium: 138 mmol/L (ref 136–145)

## 2017-03-28 LAB — SAMPLES BEING HELD

## 2017-03-28 MED ORDER — SODIUM CHLORIDE 0.9 % IJ SYRG
INTRAMUSCULAR | Status: DC | PRN
Start: 2017-03-28 — End: 2017-03-30

## 2017-03-28 MED ORDER — ESTRADIOL 1 MG TAB
1 mg | Freq: Every day | ORAL | Status: DC
Start: 2017-03-28 — End: 2017-03-30
  Administered 2017-03-29 – 2017-03-30 (×2): via ORAL

## 2017-03-28 MED ORDER — SPIRONOLACTONE 25 MG TAB
25 mg | Freq: Two times a day (BID) | ORAL | Status: DC
Start: 2017-03-28 — End: 2017-03-30
  Administered 2017-03-29 – 2017-03-30 (×4): via ORAL

## 2017-03-28 MED ORDER — SODIUM CHLORIDE 0.9 % IJ SYRG
Freq: Three times a day (TID) | INTRAMUSCULAR | Status: DC
Start: 2017-03-28 — End: 2017-03-30
  Administered 2017-03-28 – 2017-03-30 (×6): via INTRAVENOUS

## 2017-03-28 MED ORDER — SODIUM CHLORIDE 0.9% BOLUS IV
0.9 % | Freq: Once | INTRAVENOUS | Status: AC
Start: 2017-03-28 — End: 2017-03-28
  Administered 2017-03-28: 15:00:00 via INTRAVENOUS

## 2017-03-28 MED ORDER — ENOXAPARIN 40 MG/0.4 ML SUB-Q SYRINGE
40 mg/0.4 mL | Freq: Every day | SUBCUTANEOUS | Status: DC
Start: 2017-03-28 — End: 2017-03-28

## 2017-03-28 MED ORDER — MORPHINE 2 MG/ML INJECTION
2 mg/mL | Freq: Once | INTRAMUSCULAR | Status: AC
Start: 2017-03-28 — End: 2017-03-28
  Administered 2017-03-28: 15:00:00 via INTRAVENOUS

## 2017-03-28 MED ORDER — OXYCODONE 5 MG TAB
5 mg | ORAL | Status: DC | PRN
Start: 2017-03-28 — End: 2017-03-30
  Administered 2017-03-28 – 2017-03-30 (×10): via ORAL

## 2017-03-28 MED ORDER — CITALOPRAM 20 MG TAB
20 mg | Freq: Every evening | ORAL | Status: DC
Start: 2017-03-28 — End: 2017-03-30
  Administered 2017-03-29 – 2017-03-30 (×2): via ORAL

## 2017-03-28 MED ORDER — NICOTINE 21 MG/24 HR DAILY PATCH
21 mg/24 hr | TRANSDERMAL | Status: DC
Start: 2017-03-28 — End: 2017-03-30

## 2017-03-28 MED ORDER — CLONAZEPAM 0.5 MG TAB
0.5 mg | Freq: Two times a day (BID) | ORAL | Status: DC
Start: 2017-03-28 — End: 2017-03-30
  Administered 2017-03-29 – 2017-03-30 (×4): via ORAL

## 2017-03-28 MED ORDER — ENOXAPARIN 40 MG/0.4 ML SUB-Q SYRINGE
40 mg/0.4 mL | Freq: Every day | SUBCUTANEOUS | Status: DC
Start: 2017-03-28 — End: 2017-03-30
  Administered 2017-03-29 – 2017-03-30 (×2): via SUBCUTANEOUS

## 2017-03-28 MED FILL — NORMAL SALINE FLUSH 0.9 % INJECTION SYRINGE: INTRAMUSCULAR | Qty: 10

## 2017-03-28 MED FILL — NORMAL SALINE FLUSH 0.9 % INJECTION SYRINGE: INTRAMUSCULAR | Qty: 40

## 2017-03-28 MED FILL — OXYCODONE 5 MG TAB: 5 mg | ORAL | Qty: 2

## 2017-03-28 MED FILL — SODIUM CHLORIDE 0.9 % IV: INTRAVENOUS | Qty: 1000

## 2017-03-28 MED FILL — NICOTINE 21 MG/24 HR DAILY PATCH: 21 mg/24 hr | TRANSDERMAL | Qty: 1

## 2017-03-28 MED FILL — MORPHINE 2 MG/ML INJECTION: 2 mg/mL | INTRAMUSCULAR | Qty: 2

## 2017-03-28 NOTE — ED Triage Notes (Signed)
TRIAGE NOTE: Patient arrived from home with c/o numbness in the legs that started 2 days ago. Patient fell 2 days ago, [facility] but left AMA.

## 2017-03-28 NOTE — Progress Notes (Signed)
Admission Medication Reconciliation:    Information obtained from: This medication history was obtained from the patient; she appears to be an accurate historian.  An RX Query is available.    Summary:     Medications added: none  Medications deleted: none  Dose changes: fluoxetine 20 mg daily (versus 40 mg daily)    Inpatient orders were reviewed and no changes are needed.       Chief Complaint for this Admission:  lower extremity weakness     Significant PMH/Disease States:   Past Medical History:   Diagnosis Date   ??? Asthma due to environmental allergies    ??? Bipolar 1 disorder (HCC)    ??? Cat allergies    ??? Cerebral palsy (HCC)    ??? MS (multiple sclerosis) (HCC)        Allergies:  Codeine; Penicillins; Toradol [ketorolac]; Grape; Peanut; Pineapple; and Tramadol    Prior to Admission Medications:   Prior to Admission Medications   Prescriptions Last Dose Informant Patient Reported? Taking?   FLUoxetine (PROZAC) 20 mg capsule 03/28/2017 at am  Yes Yes   Sig: Take 20 mg by mouth daily.   citalopram (CELEXA) 20 mg tablet 03/27/2017 at hs  Yes Yes   Sig: Take 20 mg by mouth nightly.   clonazePAM (KLONOPIN) 1 mg tablet 03/27/2017 at hs  Yes Yes   Sig: Take 1 mg by mouth every twelve (12) hours.   estradiol (ESTRACE) 2 mg tablet 03/27/2017 at am  Yes Yes   Sig: Take 2 mg by mouth daily.   spironolactone (ALDACTONE) 25 mg tablet 03/27/2017 at hs  Yes Yes   Sig: Take 50 mg by mouth every twelve (12) hours.      Facility-Administered Medications: None         Thank you for allowing me to participate in the care of this patient.  Please contact the pharmacy [phone] or the medication reconciliation pharmacist [phone] with any questions.    Everette RankLisa Hinata Diener, Pharm.D., BCPS, BCPPS

## 2017-03-28 NOTE — Consults (Signed)
[facility]  CONSULTATION    Name:  Rodney Arnold, Rodney Arnold  MR#:  [id number]  DOB:  1985-04-04  ACCOUNT #:  [id number]  DATE OF SERVICE:  03/28/2017    REQUESTING PHYSICIAN:  Rebeca AllegraMuktak Mathur, MD    REASON FOR EVALUATION:  Bilateral lower extremity numbness and weakness.    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  She has poor balance and is prone to falling.  She reports that she tripped and fell on 03/24/2017 and landed on her back as she was sliding down the steps.  Since the fall, she has had pain in the mid to lower spine and she cannot feel anything from her waist down to her feet.  She says that she can control her bladder and bowel, but apparently she has been needing assistance to get to the toilet seat, etc.  She had an MRI of the lumbar spine during her recent hospitalization when she was first admitted.  MRI of the thoracic spine was recommended, but she could not complete that and left AMA.  She was advised to go back to the hospital to complete workup and is now readmitted.  No headache, changes in vision, speech disturbance, chest pain, shortness of breath, palpitations, or symptoms in the upper extremities.    PAST MEDICAL HISTORY:  As mentioned above.    ALLERGIES:  CODEINE, PENICILLIN, TORADOL.  FOOD ALLERGIES TO PEANUT, PINEAPPLE, AND TRAMADOL.    HOME MEDICATIONS:  Prozac, Klonopin, Aldactone, and Celexa.    SOCIAL HISTORY:  She is a current everyday smoker and rarely drinks alcohol.    REVIEW OF SYSTEMS:  As mentioned above.    FAMILY HISTORY:  Positive for alcohol abuse, colon cancer, hypertension, diabetes, and heart failure.  Psychiatric disorder in father.  Stroke, psychiatric disorder, and alcohol abuse in mother.    PHYSICAL EXAMINATION:  GENERAL:  The patient is alert, fully oriented.  VITAL SIGNS:  Blood pressure 116/53, temperature is 98.2, pulse is 70.   NEUROLOGIC:  Speech is clear.  Comprehension is normal.  Pupils are equal, round, reactive.  Extraocular movements are full.  Face is symmetric.  Tongue is midline.  Hearing is baseline.  Muscle tone and bulk normal in both upper extremities, mildly increased muscle tone in lower extremities.  She makes poor effort to moving her lower extremities and slightly bends her knee on the right side and says that she cannot move any further.  Deep tendon reflexes are hypoactive.  Toes downgoing.  Sensation subjectively reduced to light touch, pinprick, and vibration in both lower extremities up to the iliac crest and pubic region.  Gait examination is deferred.  HEART:  Regular rate and rhythm.  CHEST:  Clear.  ABDOMEN:  Soft, nontender, positive bowel sounds.  EXTREMITIES:  No edema.    LABORATORY DATA:  CBC is unremarkable.  Chemistries unremarkable.  MRI scan of the lumbar spine did not show anything significant.    ASSESSMENT AND PLAN:  A 32 year old transgender male who has been complaining of inability to feel her legs since a fall few days ago.  There is a background of cerebral palsy with spastic diplegia.  Clinically, there is functional overlay to exam with conflicting reports in history.  I also noted mention of exacerbation of pain symptoms to potentially seek analgesics.  I agree with the plan to do MRI scan of the thoracic spine  and if it is negative, psychogenic etiology would be more likely.  I will follow up once the imaging is completed.    Thank you for this consultation.        Lance BoschAMANDEEP S Azelia Reiger, MD      [id number]  D:  03/28/2017 16:45  T:  03/28/2017 22:22  JOB #:  [id number]

## 2017-03-28 NOTE — Consults (Signed)
Consult dictated.  31-year-old, transgender male, who has been complaining of inability to feel her legs since a fall a few days ago.  History of CP with spastic diplegia.  There is functional overlay to exam.  Agree with the plan to do MRI of the thoracic spine and if it is negative, psychogenic etiology would be more likely.   Lacole Komorowski S Lyllian Gause, MD

## 2017-03-28 NOTE — Other (Signed)
TRANSFER - OUT REPORT:    Verbal report given to [place]agu[place]isha, RN(name) on Rodney GuadeloupeJoseph Arnold  being transferred to [facility](unit) for routine progression of care       Report consisted of patient???s Situation, Background, Assessment and   Recommendations(SBAR).     Information from the following report(s) SBAR, Kardex, ED Summary and MAR was reviewed with the receiving nurse.    Lines:   Peripheral IV 03/28/17 Right Forearm (Active)   Site Assessment Clean, dry, & intact 03/28/2017 10:47 AM   Phlebitis Assessment 0 03/28/2017 10:47 AM   Infiltration Assessment 0 03/28/2017 10:47 AM   Dressing Status Clean, dry, & intact;Occlusive 03/28/2017 10:47 AM   Dressing Type Transparent 03/28/2017 10:47 AM   Hub Color/Line Status Pink;Flushed 03/28/2017 10:47 AM   Action Taken Blood drawn 03/28/2017 10:47 AM        Opportunity for questions and clarification was provided.      Patient transported with:   [employer]

## 2017-03-28 NOTE — Telephone Encounter (Addendum)
Called and spoke with patient after confirming patient identifiers and she reports the following:  1. Reports she fell last week and was hospitalized at [facility] for three days, d/t not to being able to feel her legs after a fall.   2. Patient reports that she has numerous diagnostic tests done, including CT and MRI and they found a lesion on her spine, otherwise nothing new/  3. Patient states that she still is not able to feel her legs and she wanted us to know.   4. Clarified with patient that she was not already in the ED, and patient stated she was not, that she was, "going to call my Case Manager, and see if she could bring me to the ED over there, and then all the information would be available to my doctor."   5. Patient denies any other issues and is advised to report to ED in the event she does. Patient verbalizes understanding.   6. Plan is the patient is going to consult with her Case Manager and then decide if she will report to ED. Patient advises that she will call the office with any updates.    End of encounter.

## 2017-03-28 NOTE — ED Notes (Signed)
Patient requesting more pain medication. MD made aware. No new orders at this time.

## 2017-03-28 NOTE — H&P (Signed)
History and Physical    Primary Care Provider: Lorri Frederick, NP    Subjective:     CC: b/l Lower ext weakness and numbness    Rodney Arnold is a 32 y.o. adult with PMH of  Cerebral Palsy. Born man, wants to be referred to male. Recent admitted after a fall, c/o paralysis and numbness of lower ext. Left AMA 2days ago before full evaluation by Neurology, having had unremarkable MRI w/o L spine, wanted MRI L and T spine with contratst. Main differential was malingering for drugs. Also declined rehab as recommended by PT/OT last admission.  Last 2 days states her boyfriend had to literally carry her from one place to another, got frustrated as continues to have pain in lower back, b/l leg paralysis and numbness, her mom, Child psychotherapist and pcp reommended to come back to ED to address his ongoing neurological problems. States willing to comply with recommendations this time.   [facility]. On eval asking for analgesia for his low back pain, and asking for food.    Review of Systems:  Further 10 point review of systems is benign.  A comprehensive review of systems was negative except for that written in the History of Present Illness.     Past Medical History:   Diagnosis Date   ??? Asthma due to environmental allergies    ??? Bipolar 1 disorder (HCC)    ??? Cat allergies    ??? Cerebral palsy (HCC)    ??? MS (multiple sclerosis) (HCC)       Past Surgical History:   Procedure Laterality Date   ??? HX ORTHOPAEDIC  1998    quadricep lengthening surgery     Prior to Admission medications    Medication Sig Start Date End Date Taking? Authorizing Provider   FLUoxetine (PROZAC) 40 mg capsule Take  by mouth daily.   Yes Provider, Historical   clonazePAM (KLONOPIN) 1 mg tablet Take  by mouth two (2) times a day.   Yes Provider, Historical   estradiol (ESTRACE) 2 mg tablet take 1 tablet by mouth once daily 10/05/16  Yes Provider, Historical   spironolactone (ALDACTONE) 25 mg tablet take 2 tablets by mouth twice a  day 10/05/16  Yes Provider, Historical   citalopram (CELEXA) 20 mg tablet Take  by mouth daily.   Yes Provider, Historical     Allergies   Allergen Reactions   ??? Codeine Anaphylaxis   ??? Penicillins Anaphylaxis   ??? Toradol [Ketorolac] Anaphylaxis   ??? Grape Swelling   ??? Peanut Swelling   ??? Pineapple Swelling   ??? Tramadol Rash and Swelling      Family History   Problem Relation Age of Onset   ??? Stroke Mother    ??? Psychiatric Disorder Mother    ??? Breast Cancer Mother    ??? Alcohol abuse Mother    ??? Hypertension Mother    ??? Colon Cancer Father    ??? Alcohol abuse Father    ??? Heart Disease Father    ??? Hypertension Father    ??? Diabetes Father    ??? Heart Failure Father    ??? Psychiatric Disorder Father    ??? Prostate Cancer Paternal Uncle       SOCIAL HISTORY:  Patient resides at Home.   Smoking history: -ve  Alcohol history: moderate  Objective:     Physical Exam:   General:  Alert, oriented, No acute distress  HEENT:  Pink conjunctivae, PERRL,  hearing intact to voice, MM moist  Neck:  Supple, without masses, thyroid non-tender  Card:  S1, S2 without murmurs, good peripheral perfusion,   Resp:  No accessory muscle use, clear breath sounds without wheezes or rhonchi  Abd:  Soft, non-tender, non-distended, BS+, no masses  Lymph:  No cervical or inguinal adenopathy  Extremities:  No cyanosis or clubbing, no significant edema  Skin:  No rashes or ulcers, skin turgor is good, multiple tattoos, piercings  Neuro:  CNs intact, follows commands, not moving lower ext at all power0/5, reflexes on knees absent 'chronic'  Psych:  Good insight, oriented to person, place and time.    ECG:  I personally reviewed: pending    Data Review:   All diagnostic labs and studies have been reviewed by myself personally.    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 Cerebral palsy: baseline mobilise with canes/crutches and sometimes independently 'I saw him walking independently few days ago during last admission'.  #. Recent Fall: PT/OT recommended rehab during that admission left AMA  #. B/l lower ext numbness and weakness: likely malingering vs other neurological pathologies  - Admit for observation to neuro Floor, get PT/OT eval and Neurological evaluation  - vitals stable, cbc and BMP wnl.    #. Chronic Back pain: judicious use of opioid analgesia, last admission UDS was +ve for opioids and benzos, will recheck UDS.    Patient's Baseline: ambulates with independently, with crutches or canes at times.  DVT px:  Lovenox  Code status: Full  Dispo: TBD    Signed By: Purvis SheffieldAsad Pilar Westergaard, MD     March 28, 2017

## 2017-03-28 NOTE — Progress Notes (Signed)
Problem: Pressure Injury - Risk of  Goal: *Prevention of pressure injury  Document Braden Scale and appropriate interventions in the flowsheet.  Outcome: Progressing Towards Goal  Pressure Injury Interventions:  Sensory Interventions: Assess changes in LOC, Check visual cues for pain, Float heels, Keep linens dry and wrinkle-free, Minimize linen layers, Pressure redistribution bed/mattress (bed type)    Moisture Interventions: Minimize layers, Offer toileting Q_hr    Activity Interventions: Increase time out of bed, Pressure redistribution bed/mattress(bed type), PT/OT evaluation    Mobility Interventions: HOB 30 degrees or less, Pressure redistribution bed/mattress (bed type), PT/OT evaluation    Nutrition Interventions: Document food/fluid/supplement intake                    Problem: Falls - Risk of  Goal: *Absence of Falls  Document Schmid Fall Risk and appropriate interventions in the flowsheet.  Outcome: Progressing Towards Goal  Fall Risk Interventions:  Mobility Interventions: Assess mobility with egress test, Communicate number of staff needed for ambulation/transfer, Patient to call before getting OOB, PT Consult for mobility concerns, PT Consult for assist device competence         Medication Interventions: Evaluate medications/consider consulting pharmacy, Patient to call before getting OOB, Teach patient to arise slowly    Elimination Interventions: Call light in reach, Patient to call for help with toileting needs, Toileting schedule/hourly rounds, Urinal in reach    History of Falls Interventions: Consult care management for discharge planning, Evaluate medications/consider consulting pharmacy

## 2017-03-28 NOTE — ED Provider Notes (Signed)
32 year old male with history of cerebral palsy he thinks of himself as a male presents to the emergency department with weakness and numbness of legs. Patient was seen in the emergency department 5 days ago for similar symptoms. Patient was admitted to the hospital for further workup and evaluation. Patient had a noncontrast MRI of the lumbar spine throat is ago that showed no acute process. It was requested that the patient have MRI of the lumbar spine with contrast the patient declined and left AMA. Patient has many piercings and obtain an MRI with contrast wasn't able to be done without cutting out the piercings. Patient says that over the past 2 days he has had pain in the lower extremities. Patient reports weakness and numbness in the legs. Patient reports difficulty walking because of pain, weakness, numbness. Patient reports symptoms have been present since leaving the hospital. Patient call PCP who recommended coming back to the emergency department.             Past Medical History:   Diagnosis Date   ??? Asthma due to environmental allergies    ??? Cat allergies    ??? Cerebral palsy (HCC)    ??? MS (multiple sclerosis) (HCC)        Past Surgical History:   Procedure Laterality Date   ??? HX ORTHOPAEDIC  1998    quadricep lengthening surgery         Family History:   Problem Relation Age of Onset   ??? Stroke Mother    ??? Psychiatric Disorder Mother    ??? Breast Cancer Mother    ??? Alcohol abuse Mother    ??? Hypertension Mother    ??? Colon Cancer Father    ??? Alcohol abuse Father    ??? Heart Disease Father    ??? Hypertension Father    ??? Diabetes Father    ??? Heart Failure Father    ??? Psychiatric Disorder Father    ??? Prostate Cancer Paternal Uncle        Social History     Socioeconomic History   ??? Marital status: SINGLE     Spouse name: Not on file   ??? Number of children: Not on file   ??? Years of education: Not on file   ??? Highest education level: Not on file   Social Needs   ??? Financial resource strain: Not on file    ??? Food insecurity - worry: Not on file   ??? Food insecurity - inability: Not on file   ??? Transportation needs - medical: Not on file   ??? Transportation needs - non-medical: Not on file   Occupational History   ??? Not on file   Tobacco Use   ??? Smoking status: Current Every Day Smoker   ??? Smokeless tobacco: Never Used   Substance and Sexual Activity   ??? Alcohol use: Yes     Comment: very rarely   ??? Drug use: No   ??? Sexual activity: Not Currently   Other Topics Concern   ??? Not on file   Social History Narrative   ??? Not on file         ALLERGIES: Other food; Codeine; Penicillins; Toradol [ketorolac]; Food allergy formula [glutamine-c-quercet-selen-brom]; Peanut; Pineapple; and Tramadol    Review of Systems   Constitutional: Negative for fever.   HENT: Negative for congestion.    Eyes: Negative for pain.   Respiratory: Negative for cough and shortness of breath.    Cardiovascular: Negative for chest  pain and leg swelling.   Gastrointestinal: Negative for abdominal pain.   Endocrine: Negative for polyuria.   Genitourinary: Negative for flank pain and hematuria.   Musculoskeletal: Positive for arthralgias, back pain and gait problem. Negative for neck pain and neck stiffness.   Skin: Negative for color change.   Allergic/Immunologic: Negative for immunocompromised state.   Neurological: Negative for speech difficulty and headaches.   Hematological: Does not bruise/bleed easily.   Psychiatric/Behavioral: Negative for confusion.   All other systems reviewed and are negative.      Vitals:    03/28/17 1029   BP: 115/80   Pulse: 79   Resp: 20   Temp: 98.1 ??F (36.7 ??C)   SpO2: 100%   Weight: 49.9 kg (110 lb)   Height: 5\' 6"  (1.676 m)            Physical Exam   Constitutional: He is oriented to person, place, and time. He appears well-developed and well-nourished. No distress.   Thin body habitus, legs contorted c/w CP history, multiple pierceing on face   HENT:   Head: Normocephalic and atraumatic.    Right Ear: External ear normal.   Left Ear: External ear normal.   Eyes: EOM are normal. Pupils are equal, round, and reactive to light.   Neck: Normal range of motion. Neck supple. No JVD present. No tracheal deviation present.   Cardiovascular: Normal rate, regular rhythm and normal heart sounds. Exam reveals no gallop and no friction rub.   No murmur heard.  Pulmonary/Chest: Effort normal and breath sounds normal. No stridor. No respiratory distress. He has no wheezes. He has no rales.   Abdominal: Soft. Bowel sounds are normal. He exhibits no distension. There is no tenderness. There is no rebound and no guarding.   Musculoskeletal: Normal range of motion. He exhibits no edema or tenderness.   Neurological: He is alert and oriented to person, place, and time. He has normal reflexes. No cranial nerve deficit. Coordination normal.   Pain in legs bilaterally w/ palpation, pt reports numbness in legs bilaterally, pt has 4/5 strength in legs bilaterally, 5/5 strength in arms bilaterally   Skin: Skin is warm and dry. No rash noted. He is not diaphoretic. No erythema.   Psychiatric: He has a normal mood and affect. His behavior is normal. Judgment and thought content normal.   Nursing note and vitals reviewed.       MDM  Number of Diagnoses or Management Options  Weakness of both lower extremities:   Diagnosis management comments: I suggested that I would be unable to do much as far as testing or treatment from the free standing emergency Department. We have no neurology here. Patient can not get further MRI here because of piercings. Patient is a complicated situation. We will have to readmit to have neurology see the patient again and have further testing and treatment done.       Amount and/or Complexity of Data Reviewed  Clinical lab tests: reviewed  Tests in the radiology section of CPT??: reviewed  Tests in the medicine section of CPT??: reviewed  Discussion of test results with the performing providers: yes   Decide to obtain previous medical records or to obtain history from someone other than the patient: yes  Obtain history from someone other than the patient: yes  Review and summarize past medical records: yes (Reviewed hospital records and consuts from neurology in hospital)  Discuss the patient with other providers: yes  Independent visualization of images, tracings,  or specimens: yes           Procedures      10:59 AM  We will readmit to have the full neurology workup that was started a few days ago.     10:59 AM  CONSULT  I spoke to Dr Christie Beckers hospitalist who will admit

## 2017-03-28 NOTE — ED Notes (Signed)
No change in assessment; VSS

## 2017-03-28 NOTE — Consults (Signed)
[facility]  CONSULTATION    Name:  Rodney Arnold, Rodney Arnold  MR#:  [id number]  DOB:  [birth year]  ACCOUNT #:  [id number]  DATE OF SERVICE:  03/28/2017    REQUESTING PHYSICIAN:  Rebeca Allegra, MD    REASON FOR EVALUATION:  Bilateral lower extremity numbness and weakness.    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  She has poor balance and is prone to falling.  She reports that she tripped and fell on 03/24/2017 and landed on her back as she was sliding down the steps.  Since the fall, she has had pain in the mid to lower spine and she cannot feel anything from her waist down to her feet.  She says that she can control her bladder and bowel, but apparently she has been needing assistance to get to the toilet seat, etc.  She had an MRI of the lumbar spine during her recent hospitalization when she was first admitted.  MRI of the thoracic spine was recommended, but she could not complete that and left AMA.  She was advised to go back to the hospital to complete workup and is now readmitted.  No headache, changes in vision, speech disturbance, chest pain, shortness of breath, palpitations, or symptoms in the upper extremities.    PAST MEDICAL HISTORY:  As mentioned above.    ALLERGIES:  CODEINE, PENICILLIN, TORADOL.  FOOD ALLERGIES TO PEANUT, PINEAPPLE, AND TRAMADOL.    HOME MEDICATIONS:  Prozac, Klonopin, Aldactone, and Celexa.    SOCIAL HISTORY:  She is a current everyday smoker and rarely drinks alcohol.    REVIEW OF SYSTEMS:  As mentioned above.    FAMILY HISTORY:  Positive for alcohol abuse, colon cancer, hypertension, diabetes, and heart failure.  Psychiatric disorder in father.  Stroke, psychiatric disorder, and alcohol abuse in mother.    PHYSICAL EXAMINATION:  GENERAL:  The patient is alert, fully oriented.  VITAL SIGNS:  Blood pressure 116/53, temperature is 98.2, pulse is 70.  NEUROLOGIC:   Speech is clear.  Comprehension is normal.  Pupils are equal, round, reactive.  Extraocular movements are full.  Face is symmetric.  Tongue is midline.  Hearing is baseline.  Muscle tone and bulk normal in both upper extremities, mildly increased muscle tone in lower extremities.  She makes poor effort to moving her lower extremities and slightly bends her knee on the right side and says that she cannot move any further.  Deep tendon reflexes are hypoactive.  Toes downgoing.  Sensation subjectively reduced to light touch, pinprick, and vibration in both lower extremities up to the iliac crest and pubic region.  Gait examination is deferred.  HEART:  Regular rate and rhythm.  CHEST:  Clear.  ABDOMEN:  Soft, nontender, positive bowel sounds.  EXTREMITIES:  No edema.    LABORATORY DATA:  CBC is unremarkable.  Chemistries unremarkable.  MRI scan of the lumbar spine did not show anything significant.    ASSESSMENT AND PLAN:  A 32 year old transgender male who has been complaining of inability to feel her legs since a fall few days ago.  There is a background of cerebral palsy with spastic diplegia.  Clinically, there is functional overlay to exam with conflicting reports in history.  I also noted mention of exacerbation of pain symptoms to potentially seek analgesics.  I agree with the plan to do MRI scan of the thoracic spine  and if it is negative, psychogenic etiology would be more likely.  I will follow up once the imaging is completed.    Thank you for this consultation.        Lance BoschAMANDEEP S Azelia Reiger, MD      [id number]  D:  03/28/2017 16:45  T:  03/28/2017 22:22  JOB #:  [id number]

## 2017-03-28 NOTE — Consults (Signed)
Consult dictated.  32 year old, transgender male, who has been complaining of inability to feel her legs since a fall a few days ago.  History of CP with spastic diplegia.  There is functional overlay to exam.  Agree with the plan to do MRI of the thoracic spine and if it is negative, psychogenic etiology would be more likely.   Lance BoschAmandeep S Morrigan Wickens, MD

## 2017-03-29 LAB — DRUG SCREEN, URINE
AMPHETAMINES: NEGATIVE
BARBITURATES: NEGATIVE
BENZODIAZEPINES: NEGATIVE
COCAINE: NEGATIVE
METHADONE: NEGATIVE
OPIATES: POSITIVE — AB
PCP(PHENCYCLIDINE): NEGATIVE
THC (TH-CANNABINOL): NEGATIVE

## 2017-03-29 MED ORDER — DIPHENHYDRAMINE-ZINC ACETATE 1 %-0.1 % TOPICAL CREAM
Freq: Three times a day (TID) | CUTANEOUS | Status: DC | PRN
Start: 2017-03-29 — End: 2017-03-30
  Administered 2017-03-29: 14:00:00 via TOPICAL

## 2017-03-29 MED ORDER — HYDROMORPHONE (PF) 1 MG/ML IJ SOLN
1 mg/mL | Freq: Every day | INTRAMUSCULAR | Status: DC | PRN
Start: 2017-03-29 — End: 2017-03-30
  Administered 2017-03-29 – 2017-03-30 (×2): via INTRAVENOUS

## 2017-03-29 MED ORDER — SODIUM CHLORIDE 0.9 % IJ SYRG
Freq: Once | INTRAMUSCULAR | Status: AC
Start: 2017-03-29 — End: 2017-03-28
  Administered 2017-03-29: 01:00:00 via INTRAVENOUS

## 2017-03-29 MED ORDER — CYCLOBENZAPRINE 10 MG TAB
10 mg | Freq: Three times a day (TID) | ORAL | Status: DC
Start: 2017-03-29 — End: 2017-03-30
  Administered 2017-03-29 – 2017-03-30 (×3): via ORAL

## 2017-03-29 MED ORDER — GADOTERATE MEGLUMINE 0.5 MMOL/ML IV SOLUTION
0.5 mmol/mL (376.9 mg/mL) | Freq: Once | INTRAVENOUS | Status: AC
Start: 2017-03-29 — End: 2017-03-28
  Administered 2017-03-29: 01:00:00 via INTRAVENOUS

## 2017-03-29 MED FILL — OXYCODONE 5 MG TAB: 5 mg | ORAL | Qty: 2

## 2017-03-29 MED FILL — NORMAL SALINE FLUSH 0.9 % INJECTION SYRINGE: INTRAMUSCULAR | Qty: 10

## 2017-03-29 MED FILL — CITALOPRAM 20 MG TAB: 20 mg | ORAL | Qty: 1

## 2017-03-29 MED FILL — BENADRYL ITCH STOPPING 1 %-0.1 % TOPICAL CREAM: CUTANEOUS | Qty: 28.3

## 2017-03-29 MED FILL — SPIRONOLACTONE 25 MG TAB: 25 mg | ORAL | Qty: 2

## 2017-03-29 MED FILL — HYDROMORPHONE (PF) 1 MG/ML IJ SOLN: 1 mg/mL | INTRAMUSCULAR | Qty: 1

## 2017-03-29 MED FILL — CYCLOBENZAPRINE 10 MG TAB: 10 mg | ORAL | Qty: 2

## 2017-03-29 MED FILL — ESTRADIOL 1 MG TAB: 1 mg | ORAL | Qty: 2

## 2017-03-29 MED FILL — ENOXAPARIN 40 MG/0.4 ML SUB-Q SYRINGE: 40 mg/0.4 mL | SUBCUTANEOUS | Qty: 0.4

## 2017-03-29 MED FILL — CLONAZEPAM 0.5 MG TAB: 0.5 mg | ORAL | Qty: 2

## 2017-03-29 MED FILL — DOTAREM 0.5 MMOL/ML (376.9 MG/ML) INTRAVENOUS SOLUTION: 0.5 mmol/mL (376.9 mg/mL) | INTRAVENOUS | Qty: 10

## 2017-03-29 MED FILL — NICOTINE 21 MG/24 HR DAILY PATCH: 21 mg/24 hr | TRANSDERMAL | Qty: 1

## 2017-03-29 NOTE — Progress Notes (Addendum)
Occupational Therapy EVALUATION with discharge  Patient: Rodney Arnold (32 y.o. adult)  Date: 03/29/2017  Primary Diagnosis: Gait abnormality [R26.9]       Precautions: fall       ASSESSMENT :  Based on the objective data described below, the patient presents at overall SPV for ADLs and SPV for mobility with walker.  This therapist recently evaluated patient on 03/26/17 during previous admission.   Immediately prior to today's evaluation, therapist observed patient ambulate ~300 ft with PCT and walker.  Consulted with RN, who reports patient recently received dilaudid, and patient reports improved back pain during OT evaluation.  Performed the following with SPV: tailor sat for doffing/ donning shoes, sit-stand, ambulated to sink with walker, and grooming standing at sink.   Patient reports she is currently at functional baseline and declined need for acute care OT.     Discharge recommendations: Home health OT.  Patient reports difficulty navigating home kitchen for  meal preparation, difficulty managing stairs with laundry, and difficulty with tub transfers at home.  Patient also reports history of 4 falls in past year.  Patient would benefit from home health OT for home safety assessment, fall prevention, and to promote independence with ADLs/ IADLs.       PLAN :  Further skilled acute occupational therapy services are not indicated at this time.      SUBJECTIVE:   Patient stated ???My back is feeling better.???    OBJECTIVE DATA SUMMARY:   HISTORY:   Past Medical History:   Diagnosis Date   ??? Asthma due to environmental allergies    ??? Bipolar 1 disorder (HCC)    ??? Cat allergies    ??? Cerebral palsy (HCC)    ??? MS (multiple sclerosis) (HCC)      Past Surgical History:   Procedure Laterality Date   ??? HX ORTHOPAEDIC  1998    quadricep lengthening surgery       Prior Level of Function/Environment/Context:  Independent with ADLs, ambulatory with no AD at baseline, does endorse 4 falls in past year.   Reports she has ordered lofstrand crutches and needs to pick them up or have them delivered  Expanded or extensive additional review of patient history:     Home Situation  Home Environment: Private residence  # Steps to Enter: 5  One/Two Story Residence: Two story  Living Alone: No  Support Systems: Games developer  Patient Expects to be Discharged to:: Private residence  Current DME Used/Available at Home: None  EXAMINATION OF PERFORMANCE DEFICITS:  Cognitive/Behavioral Status:  Neurologic State: Alert  Orientation Level: Oriented X4  Cognition: Follows commands;Appropriate for age attention/concentration  Perception: Appears intact  Perseveration: No perseveration noted  Safety/Judgement: Awareness of environment;Insight into deficits    Skin: visible skin appears intact    Edema: none noted    Hearing:  Auditory  Auditory Impairment: None    Vision/Perceptual:                           Acuity: Within Defined Limits       Range of Motion:    AROM: Within functional limits(BUE)  PROM: Within functional limits                    Strength:  Strength: Within functional limits(BUE)                Coordination:  Coordination: Within functional limits(BUE)  Fine Motor Skills-Upper: Left Intact;Right  Intact    Gross Motor Skills-Upper: Left Intact;Right Intact  Tone & Sensation:    Tone: Normal  Sensation: Impaired(reports BLE numbness before waist)                    Balance:  Sitting: Intact  Standing: Impaired  Standing - Static: Good  Standing - Dynamic : Fair    Functional Mobility and Transfers for ADLs:Bed Mobility:  Supine to Sit: Stand-by assistance  Sit to Supine: Stand-by assistance  Scooting: Stand-by assistance    Transfers:  Sit to Stand: Supervision  Stand to Sit: Supervision  Toilet Transfer : Supervision(inferred)    ADL Assessment:  Feeding: Independent    Oral Facial Hygiene/Grooming: Supervision(brushed teeth standing at sink)    Bathing: Supervision(inferred)     Upper Body Dressing: Supervision(inferred)    Lower Body Dressing: Supervision(doffed/ donned shoes in tailor sit)    Toileting: Supervision(inferred)              ADL Intervention and task modifications:               Cognitive Retraining  Safety/Judgement: Awareness of environment;Insight into deficits  Functional Measure:  Barthel Index:    Bathing: 5  Bladder: 10  Bowels: 10  Grooming: 5  Dressing: 10  Feeding: 10  Mobility: 15  Stairs: 0  Toilet Use: 10  Transfer (Bed to Chair and Back): 15  Total: 90/100       Percentage of impairment   0%   1-19%   20-39%   40-59%   60-79%   80-99%   100%   Barthel Score 0-100 100 99-80 79-60 59-40 20-39 1-19   0     The Barthel ADL Index: Guidelines  1. The index should be used as a record of what a patient does, not as a record of what a patient could do.  2. The main aim is to establish degree of independence from any help, physical or verbal, however minor and for whatever reason.  3. The need for supervision renders the patient not independent.  4. A patient's performance should be established using the best available evidence. Asking the patient, friends/relatives and nurses are the usual sources, but direct observation and common sense are also important. However direct testing is not needed.  5. Usually the patient's performance over the preceding 24-48 hours is important, but occasionally longer periods will be relevant.  6. Middle categories imply that the patient supplies over 50 per cent of the effort.  7. Use of aids to be independent is allowed.    Clarisa Kindred., Barthel, D.W. [phone]). Functional evaluation: the Barthel Index. Md State Med J (14)2.  Zenaida Niece der Rolette, J.J.M.F, [place], Ian Malkin., Margret Chance., [place], [place]. (1999). Measuring the change indisability after inpatient rehabilitation; comparison of the responsiveness of the Barthel Index and Functional Independence Measure. Journal of Neurology, Neurosurgery, and Psychiatry, 66(4), [phone].   Dawson Bills, N.J.A, [place],  W.J.M, & Koopmanschap, M.A. (2004.) Assessment of post-stroke quality of life in cost-effectiveness studies: The usefulness of the Barthel Index and the EuroQoL-5D. Quality of Life Research, 72, 098-11       Occupational Therapy Evaluation Charge Determination   History Examination Decision-Making   LOW Complexity : Brief history review  LOW Complexity : 1-3 performance deficits relating to physical, cognitive , or psychosocial skils that result in activity limitations and / or participation restrictions  MEDIUM Complexity : Patient may present with comorbidities that affect occupational performnce. Miniml to  moderate modification of tasks or assistance (eg, physical or verbal ) with assesment(s) is necessary to enable patient to complete evaluation       Based on the above components, the patient evaluation is determined to be of the following complexity level: LOW   Pain:  Patient reports improved pain, recently received dilaudid  Activity Tolerance:   VSS    After treatment patient left:   Up in chair  Call light within reach  RN notified   COMMUNICATION/EDUCATION:   The patient???s evaluation was discussed with: Physical Therapist and Registered Nurse.    Thank you for this referral.  Awilda Metroameron Williams, OT  Time Calculation: 16 mins

## 2017-03-29 NOTE — Progress Notes (Signed)
Problem: Mobility Impaired (Adult and Pediatric)  Goal: *Acute Goals and Plan of Care (Insert Text)  Physical Therapy Goals  Initiated 03/29/2017  1.  Patient will move from supine to sit and sit to supine , scoot up and down and roll side to side in bed with modified independence within 7 day(s).    2.  Patient will transfer from bed to chair and chair to bed with modified independence using the least restrictive device within 7 day(s).  3.  Patient will perform sit to stand with modified independence within 7 day(s).  4.  Patient will ambulate with modified independence for 50 feet with the least restrictive device within 7 day(s).   5.  Patient will ascend/descend 5 stairs with 1 handrail(s) with moderate assistance  within 7 day(s).    physical Therapy EVALUATION  Patient: Rodney Arnold (32 y.o. adult)  Date: 03/29/2017  Primary Diagnosis: Gait abnormality [R26.9]       Precautions: Fall, prefers male pronouns       ASSESSMENT :  Based on the objective data described below, the patient presents with decreased functional mobillity due to reported numbness, weakness, and pain in BLEs.  Patient was recently admitted to [facility] for GLF, MRI showed disc bulge L2-3, patient left AMA.  Of note, patient has a history of CP (LLE most affected), however was independent with mobility without AD.  This date, patient reports unable to move LEs, but mobilized to edge of bed with SBA and sit<>stand with CGA.  Trialled ambulation with SPC, patient only able to take a few steps due to reported pain.  Ambulated with RW and patient able to tolerate 27ft with CGA reporting pain throughout, however steady with no LOB.  Patient reports no sensation below the waist.  Patient states preference to return home, not open to rehab.  Recommending HH PT with caregiver assist and use of Loftstrand crutches (patient has used them in past.  Crutches have been ordered by OP PT and need to be picked up).        PLOF: indep functional mobility without use of AD, negotiating 2 level home where husband and roommate available to assist if needed, h/o falls. Pt reports loft strand crutches ordered by outpt PT ~6 mos ago; states that crutches delivered to [place], [place] and pt is unable to make arrangements to obtain, then transferred locally for pickup by PCP.  Patient needs to follow up with PCP to identify location of pickup.     Patient will benefit from skilled intervention to address the above impairments.  Patient???s rehabilitation potential is considered to be Good  Factors which may influence rehabilitation potential include:   []          None noted  []          Mental ability/status  []          Medical condition  [x]          Home/family situation and support systems  []          Safety awareness  [x]          Pain tolerance/management  []          Other:      PLAN :  Recommendations and Planned Interventions:  [x]            Bed Mobility Training             [x]     Neuromuscular Re-Education  [x]   Transfer Training                   []     Orthotic/Prosthetic Training  [x]            Gait Training                         []     Modalities  [x]            Therapeutic Exercises           []     Edema Management/Control  [x]            Therapeutic Activities            [x]     Patient and Family Training/Education  []            Other (comment):    Frequency/Duration: Patient will be followed by physical therapy  4 times a week to address goals.  Discharge Recommendations: Home Health with caregiver assist  Further Equipment Recommendations for Discharge: None (loftstrand crutches ordered by OP PT)     SUBJECTIVE:   Patient stated ???My husband has been carrying me up the stairs.???    OBJECTIVE DATA SUMMARY:   HISTORY:    Past Medical History:   Diagnosis Date   ??? Asthma due to environmental allergies    ??? Bipolar 1 disorder (HCC)    ??? Cat allergies    ??? Cerebral palsy (HCC)    ??? MS (multiple sclerosis) (HCC)       Past Surgical History:   Procedure Laterality Date   ??? HX ORTHOPAEDIC  1998    quadricep lengthening surgery     Prior Level of Function/Home Situation: indep functional mobility without use of AD, negotiating 2 level home where husband and roommate available to assist if needed, h/o falls. Pt reports loft strand crutches ordered by outpt PT ~6 mos ago; states that crutches delivered to [employer], [place] and pt is unable to make arrangements to obtain, then transferred locally for pickup by PCP.  Patient needs to follow up with PCP to identify location of pickup.   Personal factors and/or comorbidities impacting plan of care: pain, cannot drive, stairs    Home Situation  Home Environment: Private residence  # Steps to Enter: 5  One/Two Story Residence: Two story  Living Alone: No  Support Systems: Games developer  Patient Expects to be Discharged to:: Private residence  Current DME Used/Available at Home: None  EXAMINATION/PRESENTATION/DECISION MAKING: Critical Behavior:  Neurologic State: Alert  Orientation Level: Oriented X4  Cognition: Follows commands     Hearing:  Auditory  Auditory Impairment: NoneSkin:    Edema:   Range Of Motion:  AROM: Generally decreased, functional           PROM: Within functional limits           Strength:    Strength: Generally decreased, functional                    Tone & Sensation:   Tone: Normal              Sensation: Impaired(reports numbness below the waist)               Coordination:  Coordination: Generally decreased, functional  Vision:      Functional Mobility:  Bed Mobility:     Supine to Sit: Stand-by assistance  Sit to Supine: Stand-by assistance  Scooting: Stand-by assistance  Transfers:  Sit to Stand: Contact guard assistance  Stand to Sit: Contact guard assistance                       Balance:   Sitting: Intact  Standing: Impaired;With support  Standing - Static: Fair  Standing - Dynamic : FairAmbulation/Gait Training:Distance (ft): 20 Feet (ft)   Assistive Device: Gait belt;Cane, straight;Walker, rolling  Ambulation - Level of Assistance: Contact guard assistance;Adaptive equipment;Additional time;Moderate assistance(mod with cane, CGA with RW)        Gait Abnormalities: Antalgic;Decreased step clearance;Shuffling gait(severe genu valgum BLEs)        Base of Support: Narrowed     Speed/Cadence: Pace decreased (<100 feet/min);Shuffled  Step Length: Left shortened;Right shortened              Functional Measure:  Berg Balance Test:    Sitting to Standing: 3  Standing Unsupported: 0  Sitting with Back Unsupported: 4  Standing to Sitting: 3  Transfers: 1  Standing Unsupported with Eyes Closed: 0  Standing Unsupported with Feet Together: 0  Reach Forward with Outstretched Arm: 0  Pick Up Object: 0  Turn to Look Over Shoulders: 0  Turn 360 Degrees: 0  Alternate Foot on Step/Stool: 0  Standing Unsupported One Foot in Front: 0  Stand on One Leg: 0  Total: 11        56=Maximum possible score;   0-20=High fall risk  21-40=Moderate fall risk   41-56=Low fall risk        Physical Therapy Evaluation Charge Determination   History Examination Presentation Decision-Making   HIGH Complexity :3+ comorbidities / personal factors will impact the outcome/ POC  HIGH Complexity : 4+ Standardized tests and measures addressing body structure, function, activity limitation and / or participation in recreation  LOW Complexity : Stable, uncomplicated  LOW Complexity : FOTO score of 75-100      Based on the above components, the patient evaluation is determined to be of the following complexity level: LOW     Pain:  Pain Scale 1: Numeric (0 - 10)  Pain Intensity 1: 8              Activity Tolerance:   VSS throughout session  Please refer to the flowsheet for vital signs taken during this treatment.  After treatment:   []          Patient left in no apparent distress sitting up in chair  [x]          Patient left in no apparent distress in bed  [x]          Call bell left within reach   [x]          Nursing notified  []          Caregiver present  []          Bed alarm activated    COMMUNICATION/EDUCATION:   The patient???s plan of care was discussed with: Occupational Therapist and Registered Nurse.  [x]          Fall prevention education was provided and the patient/caregiver indicated understanding.  [x]          Patient/family have participated as able in goal setting and plan of care.  [x]          Patient/family agree to work toward stated goals and plan of care.  []          Patient understands intent and goals of therapy, but is neutral about his/her participation.  []   Patient is unable to participate in goal setting and plan of care.    Thank you for this referral.  Richarda Blade, PT, DPT   Time Calculation: 22 mins

## 2017-03-29 NOTE — Progress Notes (Addendum)
Reason for Admission:   Patient with a history of CP admitted with lower extremity numbness                  RRAT Score:    18              Do you (patient/family) have any concerns for transition/discharge?    Patient requesting Home health for PT/OT              Plan for utilizing home health:   TBD      Likelihood of readmission?   Moderate r/t RRAT score            Transition of Care Plan:     Home    Care manager met with patient to explain role and discuss transitions of care. Patient lives independently with her spouse Rodney Arnold [phone] in a home that has multiple levels with 18 steps inside and 5 to enter. Per patient he uses a cane for ambulation, has no previous home health need. Patient confirmed her PCP to be [name] and sees her at least yearly and uses the [facility] on [address] as her pharmacy. Per patient her Mental health worker [place] with [place] [phone]) transports her to her appointments. Confirmed patient's insurance information and address to be correct on the demographic sheet. Patient is under observation status, provided obs letters which patient signed, but declined a copy of the letters. Original letters placed on patient's bedside chart. Patient is requesting home health for PT/OT, will await therapy notes. Patient has no preference for agency, is agreeable for referral to be made to [facility]. [name] RN,CRM    Care Management Interventions  PCP Verified by CM: Yes([name])  MyChart Signup: No  Discharge Durable Medical Equipment: No  Physical Therapy Consult: Yes  Occupational Therapy Consult: Yes  Speech Therapy Consult: No  Current Support Network: Lives with Spouse(Spouse Rodney Arnold [phone])  Confirm Follow Up Transport: Family  Discharge Location  Discharge Placement: Home     Referral mad to [facility] for Pt/OT through connect [facility]. [name] RN,CRM

## 2017-03-29 NOTE — Progress Notes (Signed)
Hospitalist Progress Note      Hospital summary: Rodney Arnold is a 32 y.o. adult with PMH of  Cerebral Palsy. Born man, wants to be referred to male. Recent admitted after a fall, c/o paralysis and numbness of lower ext. Left AMA 2days ago before full evaluation by Neurology, having had unremarkable MRI w/o L spine, wanted MRI L and T spine with contratst. Main differential was malingering for drugs. Also declined rehab as recommended by PT/OT last admission.  Last 2 days states her boyfriend had to literally carry her from one place to another, got frustrated as continues to have pain in lower back, b/l leg paralysis and numbness, her mom, Child psychotherapistsocial worker and pcp reommended to come back to ED to address his ongoing neurological problems. States willing to comply with recommendations this time.   [facility]. On eval asking for analgesia for his low back pain.  03/28/2017      Assessment/Plan:  #. B/l lower ext numbness and weakness: likely malingering vs other neurological pathologies  - vitals stable, cbc and BMP wnl.  - Appreciate neurology input  - MRI T spine: normal  - patient denied any stress  - psychiatry consulted    #. Cerebral palsy with spastic diplegia  - baseline mobilise with canes/crutches and sometimes independently 'saw him walking independently few days ago during last admission'.    #. Recent Fall: PT/OT.   ??  #. Chronic Back pain: judicious use of opioid analgesia,  UDS was +ve for opioids. Added flexeril today. No opioids on discharge    #.??Sex reassignment therapy - currently on estrace and spironolactone. ??Prefers to go by "Rodney Arnold"    #. Depression: celexa, klonopin.    #. Tobacco abuse: on nicotine patch    Code status: Full   DVT prophylaxis:  Disposition: TBD. Possible dc to home tomorrow with outpt PT.   ----------------------------------------------    CC: low back pain, hip pain, extremity weakness and numbness     S: patient is seen and examined at bedside. Patient stated " I can not feel my legs". When requested to move his legs or wiggle his toes;, he stated he can not do it. Later during the encounter, patient voluntarily lifted his right leg explaining regarding his previous surgeries.    He has been c/o severe back pain and bilateral hip pain. Explained in detail that MRI spine is normal and no indication for opioid medications.     Review of Systems:  A comprehensive review of systems was negative.    O:  Visit Vitals  BP 117/67 (BP 1 Location: Left arm, BP Patient Position: Sitting)   Pulse 70   Temp 97.7 ??F (36.5 ??C)   Resp 19   Ht 5\' 6"  (1.676 m)   Wt 49.5 kg (109 lb 2 oz)   SpO2 95%   BMI 17.61 kg/m??       PHYSICAL EXAM:  Gen: NAD, non-toxic  HEENT: anicteric sclerae, normal conjunctiva, oropharynx clear, MM moist  Neck: supple, trachea midline, no adenopathy  Heart: RRR, no MRG, no JVD, no peripheral edema  Lungs: CTA b/l, non-labored respirations  Abd: soft, NT, ND, BS+, no organomegaly  Extr: warm  Skin: dry, no rash  Neuro: CN II-XII grossly intact, normal speech, moves all extremities  Psych: normal mood, appropriate affect      Intake/Output Summary (Last 24 hours) at 03/29/2017 1618  Last data filed at 03/28/2017 2130  Gross per 24 hour  Intake ???   Output 225 ml   Net -225 ml        Recent labs & imaging reviewed:  Recent Results (from the past 24 hour(s))   DRUG SCREEN, URINE    Collection Time: 03/28/17  9:57 PM   Result Value Ref Range    AMPHETAMINES NEGATIVE  NEG      BARBITURATES NEGATIVE  NEG      BENZODIAZEPINES NEGATIVE  NEG      COCAINE NEGATIVE  NEG      METHADONE NEGATIVE  NEG      OPIATES POSITIVE (A) NEG      PCP(PHENCYCLIDINE) NEGATIVE  NEG      THC (TH-CANNABINOL) NEGATIVE  NEG      Drug screen comment (NOTE)      Recent Labs     03/28/17  1053   WBC 6.2   HGB 14.8   HCT 46.3   PLT 166     Recent Labs     03/28/17  1053   NA 138   K 4.3   CL 99   CO2 30   BUN 14   CREA 0.80   GLU 98   CA 9.1      No results for input(s): SGOT, GPT, ALT, AP, TBIL, TBILI, TP, ALB, GLOB, GGT, AML, LPSE in the last 72 hours.    No lab exists for component: AMYP, HLPSE  No results for input(s): INR, PTP, APTT in the last 72 hours.    No lab exists for component: INREXT   No results for input(s): FE, TIBC, PSAT, FERR in the last 72 hours.   No results found for: FOL, RBCF   No results for input(s): PH, PCO2, PO2 in the last 72 hours.  No results for input(s): CPK, CKNDX, TROIQ in the last 72 hours.    No lab exists for component: CPKMB  No results found for: CHOL, CHOLX, CHLST, CHOLV, HDL, LDL, LDLC, DLDLP, TGLX, TRIGL, TRIGP, CHHD, CHHDX  No results found for: GLUCPOC  No results found for: COLOR, APPRN, SPGRU, REFSG, PHU, PROTU, GLUCU, KETU, BILU, UROU, NITU, LEUKU, GLUKE, EPSU, BACTU, WBCU, RBCU, CASTS, UCRY    Med list reviewed  Current Facility-Administered Medications   Medication Dose Route Frequency   ??? diphenhydrAMINE-zinc acetate 1%-0.1% (BENADRYL) cream   Topical TID PRN   ??? cyclobenzaprine (FLEXERIL) tablet 20 mg  20 mg Oral TID   ??? HYDROmorphone (PF) (DILAUDID) injection 0.5 mg  0.5 mg IntraVENous DAILY PRN   ??? sodium chloride (NS) flush 5-40 mL  5-40 mL IntraVENous Q8H   ??? sodium chloride (NS) flush 5-40 mL  5-40 mL IntraVENous PRN   ??? oxyCODONE IR (ROXICODONE) tablet 10 mg  10 mg Oral Q4H PRN   ??? enoxaparin (LOVENOX) injection 40 mg  40 mg SubCUTAneous DAILY   ??? citalopram (CELEXA) tablet 20 mg  20 mg Oral QHS   ??? clonazePAM (KlonoPIN) tablet 1 mg  1 mg Oral Q12H   ??? estradiol (ESTRACE) tablet 2 mg  2 mg Oral DAILY   ??? spironolactone (ALDACTONE) tablet 50 mg  50 mg Oral Q12H   ??? nicotine (NICODERM CQ) 21 mg/24 hr patch 1 Patch  1 Patch TransDERmal Q24H       Care Plan discussed with:  Patient/Family, Nurse and Case Manager    Ronnie Derby, MD  Internal Medicine  Date of Service: 03/29/2017

## 2017-03-29 NOTE — Progress Notes (Signed)
Bedside and Verbal shift change report given to [facility] RN (oncoming nurse) by Everardo PacificKenisha RN (offgoing nurse). Report included the following information SBAR, Kardex, Intake/Output, MAR and Recent Results.

## 2017-03-29 NOTE — Progress Notes (Signed)
NN [facility] if Dr Magnus IvanHabib would agree to follow patient post hospital d[facility]. Dr Magnus IvanHabib will not be following. NN d/w Engineer, manufacturingpractice manager as there had been discussion with PCP regarding dismissing patient from the practice. Plan is for patients dismissal due to noncompliance. Our NP Randolph BingPam Harrington will agree to follow patient for post hospital transition of care and manage HH orders for the next 30 days. Patient will be receiving a dismissal letter and following the 30 day TOC period patient will need to find a new PCP. [facility] this information.
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
## 2017-03-29 NOTE — Progress Notes (Addendum)
[id number] Patient called out stating having difficulty urinating. Stated "I've been trying to pee for 10 minutes and nothing is happening." Patient explained difficulties with urination when drinking dark sodas. Patient requesting to be placed on IV fluids. Asked patient is he can drink water. Patient stated doesn't like ice water. This RN to bring room temperature pitcher of water for patient.     0040 Bladder scanned patient and 160mL was scanned. Explained that we would monitor patient and the amount in bladder. Would possibly have to straight cath is still unable to void.     0130 Patient reported able to urinate, believes it was from trying to stand to use urinal instead of sitting. Patient also still drinking Pepsi. Advised patient that if dark sodas give patient problems urinating, then to only drink water. Patient then informed this RN that the alarm on their phone was set for 0330 to wake up for pain meds, will request if in pain.    0330 Patient called out for pain meds. Due at 0345, administered at 0345.    0345 Patient's monitor on stand-by, not reflecting rhythm. Patient stated "I took them off because I can't sleep with them on". Reminded patient that they are on telemetry, but patient stated "I sleep better on my stomach". Left patient's room with patient off telemetry    [phone] Patient called out stating "I hurt all over; I think it's the way I slept". Explained that pain medication cannot be given until 0745. Patient asked for cup of ice for drink
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
## 2017-03-29 NOTE — Progress Notes (Addendum)
CM met with attending regarding this pt. Per attending this pt can go to outpatient therapy and she will not agree to home health. Home health orders discontinued. A prescription for outpatient therapy is on the chart. CM will follow. Rica Koyanagi, BSW,ACM-SW

## 2017-03-30 MED ORDER — CYCLOBENZAPRINE 10 MG TAB
10 mg | ORAL_TABLET | Freq: Three times a day (TID) | ORAL | 0 refills | Status: DC
Start: 2017-03-30 — End: 2018-01-17

## 2017-03-30 MED ORDER — HYDROMORPHONE (PF) 1 MG/ML IJ SOLN
1 mg/mL | Freq: Once | INTRAMUSCULAR | Status: AC
Start: 2017-03-30 — End: 2017-03-30
  Administered 2017-03-30: 14:00:00 via INTRAVENOUS

## 2017-03-30 MED FILL — NORMAL SALINE FLUSH 0.9 % INJECTION SYRINGE: INTRAMUSCULAR | Qty: 10

## 2017-03-30 MED FILL — CLONAZEPAM 0.5 MG TAB: 0.5 mg | ORAL | Qty: 2

## 2017-03-30 MED FILL — SPIRONOLACTONE 25 MG TAB: 25 mg | ORAL | Qty: 2

## 2017-03-30 MED FILL — ESTRADIOL 1 MG TAB: 1 mg | ORAL | Qty: 2

## 2017-03-30 MED FILL — HYDROMORPHONE (PF) 1 MG/ML IJ SOLN: 1 mg/mL | INTRAMUSCULAR | Qty: 1

## 2017-03-30 MED FILL — CYCLOBENZAPRINE 10 MG TAB: 10 mg | ORAL | Qty: 2

## 2017-03-30 MED FILL — ENOXAPARIN 40 MG/0.4 ML SUB-Q SYRINGE: 40 mg/0.4 mL | SUBCUTANEOUS | Qty: 0.4

## 2017-03-30 MED FILL — OXYCODONE 5 MG TAB: 5 mg | ORAL | Qty: 2

## 2017-03-30 MED FILL — CITALOPRAM 20 MG TAB: 20 mg | ORAL | Qty: 1

## 2017-03-30 NOTE — Progress Notes (Signed)
Hospital follow-up PCP transitional care appointment has been scheduled with Judithe ModestPamela Harrington, NP for Wednesday, 04/06/17 at 11:00 a.m.  Pending patient discharge.  Elio ForgetGladys Randolph, Care Management Specialist.

## 2017-03-30 NOTE — Progress Notes (Signed)
Therapy mobilized and is now not recommending any OP therapy. Patient arranging ride for home. Per psychiatry patient has resources for F/U psych treatment.    Care Management Interventions  PCP Verified by CM: Yes([name])  MyChart Signup: No  Discharge Durable Medical Equipment: No  Physical Therapy Consult: Yes  Occupational Therapy Consult: Yes  Speech Therapy Consult: No  Current Support Network: Lives with Spouse(Spouse Lonia BloodSalahuddin White [phone])  Confirm Follow Up Transport: Family  Discharge Location  Discharge Placement: Home
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
## 2017-03-30 NOTE — Progress Notes (Signed)
Problem: Pressure Injury - Risk of  Goal: *Prevention of pressure injury  Document Braden Scale and appropriate interventions in the flowsheet.  Outcome: Progressing Towards Goal  Pressure Injury Interventions:  Sensory Interventions: Assess changes in LOC, Discuss PT/OT consult with provider, Float heels, Keep linens dry and wrinkle-free, Maintain/enhance activity level, Minimize linen layers, Monitor skin under medical devices    Moisture Interventions: Minimize layers    Activity Interventions: Increase time out of bed, PT/OT evaluation    Mobility Interventions: HOB 30 degrees or less, PT/OT evaluation, Pressure redistribution bed/mattress (bed type)    Nutrition Interventions: Document food/fluid/supplement intake                    Problem: Falls - Risk of  Goal: *Absence of Falls  Document Schmid Fall Risk and appropriate interventions in the flowsheet.  Outcome: Progressing Towards Goal  Fall Risk Interventions:  Mobility Interventions: Patient to call before getting OOB, PT Consult for mobility concerns, Utilize walker, cane, or other assistive device         Medication Interventions: Evaluate medications/consider consulting pharmacy, Patient to call before getting OOB, Teach patient to arise slowly    Elimination Interventions: Call light in reach, Patient to call for help with toileting needs, Toilet paper/wipes in reach, Urinal in reach    History of Falls Interventions: Consult care management for discharge planning, Room close to nurse's station
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
## 2017-03-30 NOTE — Progress Notes (Signed)
Problem: Mobility Impaired (Adult and Pediatric)  Goal: *Acute Goals and Plan of Care (Insert Text)  Physical Therapy Goals  Initiated 03/29/2017  1.  Patient will move from supine to sit and sit to supine , scoot up and down and roll side to side in bed with modified independence within 7 day(s).    2.  Patient will transfer from bed to chair and chair to bed with modified independence using the least restrictive device within 7 day(s).  3.  Patient will perform sit to stand with modified independence within 7 day(s).  4.  Patient will ambulate with modified independence for 50 feet with the least restrictive device within 7 day(s).   5.  Patient will ascend/descend 5 stairs with 1 handrail(s) with moderate assistance  within 7 day(s).     physical Therapy TREATMENT  Patient: Rodney Arnold (32 y.o. adult)  Date: 03/30/2017  Diagnosis: Gait abnormality [R26.9] Lower extremity weakness      Precautions:    Chart, physical therapy assessment, plan of care and goals were reviewed.    ASSESSMENT:  Pt alert, pleasant, eager to go home today. Pt tolerated amb in room and on unit without AD, supervision, no LOB or c/o; endorses near baseline LOF.  Pt maintained static/ dynamic stand without difficulty while taking meds, retrieving personal items from floor and bag in prep for leaving room to ambulate. Pt declined stair training this date; states that husband and roommates will assist as needed.    Pt demo indep with bed mob and transfers, ambulating with supervision. Presents near baseline LOF at this time with family assist available as needed at home, no d/c concerns. Pt cleared for d/c home from mobility standpoint. No follow up therapy needs at this time.  Progression toward goals:  [x]     Improving appropriately and progressing toward goals  []     Improving slowly and progressing toward goals  []     Not making progress toward goals and plan of care will be adjusted     PLAN:   Patient continues to benefit from skilled intervention to address the above impairments.  Continue treatment per established plan of care.  Discharge Recommendations:  None  Further Equipment Recommendations for Discharge:  Pt awaiting loft strand crutches     SUBJECTIVE:   Patient stated  Pt eager to ambulate, anticipates d/c home today    OBJECTIVE DATA SUMMARY:   Critical Behavior:  Neurologic State: Appropriate for age, Alert  Orientation Level: Oriented X4  Cognition: Follows commands, Appropriate safety awareness, Appropriate for age attention/concentration, Appropriate decision making  Safety/Judgement: Awareness of environment, Insight into deficits  Functional Mobility Training:  Bed Mobility:     Supine to Sit: Independent  Sit to Supine: Independent           Transfers:  Sit to Stand: Independent  Stand to Sit: Independent                             Balance:  Sitting: Intact  Standing: Intact  Standing - Static: Good  Standing - Dynamic : Good(occasional support)Ambulation/Gait Training:  Distance (ft): 250 Feet (ft)  Assistive Device: (none)  Ambulation - Level of Assistance: Supervision        Gait Abnormalities: Decreased step clearance;Scissoring;Trunk sway increased        Base of Support: Center of gravity altered;Narrowed     Speed/Cadence: Pace decreased (<100 feet/min)  Step Length: Left shortened;Right shortened  Swing Pattern: Left asymmetrical                    Stairs - Level of Assistance: (pt declined)        Pain:  Pain Scale 1: Numeric (0 - 10)  Pain Intensity 1: 4              Activity Tolerance:   Pt tolerated standing activity, amb in room and on unit. No c/o during session, but notes pain med due at end of session.  Please refer to the flowsheet for vital signs taken during this treatment.  After treatment:   []     Patient left in no apparent distress sitting up in chair  [x]     Patient left in no apparent distress in bed  [x]     Call bell left within reach  [x]     Nursing notified   []     Caregiver present  []     Bed alarm activated    COMMUNICATION/COLLABORATION:   The patient???s plan of care was discussed with: Registered Nurse    Marcelino DusterSusan Jones, PT   Time Calculation: 23 mins

## 2017-03-30 NOTE — Other (Deleted)
Patient with MS and CP, admitted with LE weakness, noted to have BMI: 17.43 kg/m 2. If possible, please document in progress notes and d/c summary if you are evaluating and/or treating any of the following:    =>Underweight  =>Cachexia  =>Failure to Thrive  =>Other explanation of clinical findings  =>Clinically Undetermined (no explanation for clinical findings)    The medical record reflects the following:       Risk Factors: CP, MS       Clinical Indicators:   BMI: 17.43 kg/m 2  Ht: 5\' 6"  (1.676 m)  Wt: 49 kg (108 lb)       Treatment: dual skin assessment, turn q2 hours    Please clarify and document your clinical opinion in the progress notes and discharge summary, including the definitive and or presumptive diagnosis, (suspected or probable), related to the above clinical findings.  Please include clinical findings supporting your diagnosis.     Thank you,  Edison Simonayna Austin, RN  [phone]

## 2017-03-30 NOTE — Other (Signed)
DOWNGRADED TO OBSERVATION - PLEASE FAX BACK OBS AUTH

## 2017-03-30 NOTE — Progress Notes (Signed)
Problem: Pressure Injury - Risk of  Goal: *Prevention of pressure injury  Document Braden Scale and appropriate interventions in the flowsheet.  Outcome: Progressing Towards Goal  Pressure Injury Interventions:  Sensory Interventions: Assess changes in LOC, Float heels, Keep linens dry and wrinkle-free, Turn and reposition approx. every two hours (pillows and wedges if needed)    Moisture Interventions: Absorbent underpads    Activity Interventions: Increase time out of bed, Pressure redistribution bed/mattress(bed type)    Mobility Interventions: Pressure redistribution bed/mattress (bed type), Turn and reposition approx. every two hours(pillow and wedges)    Nutrition Interventions: Document food/fluid/supplement intake    Friction and Shear Interventions: Lift sheet               Problem: Falls - Risk of  Goal: *Absence of Falls  Document Schmid Fall Risk and appropriate interventions in the flowsheet.  Outcome: Progressing Towards Goal  Fall Risk Interventions:  Mobility Interventions: Patient to call before getting OOB, Utilize walker, cane, or other assistive device         Medication Interventions: Teach patient to arise slowly    Elimination Interventions: Call light in reach, Patient to call for help with toileting needs, Toilet paper/wipes in reach, Toileting schedule/hourly rounds    History of Falls Interventions: Door open when patient unattended

## 2017-03-30 NOTE — Discharge Summary (Signed)
Discharge Summary by Rodney Derby, MD at 03/30/17 [phone]                Author: Ronnie Derby, MD  Service: Hospitalist  Author Type: Physician       Filed: 03/30/17 1435  Date of Service: 03/30/17 0849  Status: Signed          Editor: Rodney Derby, MD (Physician)                                                                                                        Discharge Summary        Patient:  Rodney Arnold        MRN: [id number]        Date of birth: Feb 12, 1985        Age: 32 y.o.       Date of admission:  03/28/2017      Date of discharge:  03/30/2017      Primary care provider: Dr. Valrie Arnold, Rodney Herter, NP      Admitting provider:  Rebeca Allegra, MD      Discharging provider:  Ronnie Derby, MD - Office Blackberry: [phone].    If unavailable, call [phone] and ask the operator to page the triage hospitalist.      Consultations   ??  IP CONSULT TO NEUROLOGY   ??  IP CONSULT TO PSYCHIATRY      Procedures   ??  * No surgery found *      Discharge destination: home .  The patient is stable for discharge.      Admission diagnosis   ??  Gait abnormality [R26.9]        Current Discharge Medication List              START taking these medications          Details        cyclobenzaprine (FLEXERIL) 10 mg tablet  Take 2 Tabs by mouth three (3) times daily.   Qty: 30 Tab, Refills:  0                     CONTINUE these medications which have NOT CHANGED          Details        FLUoxetine (PROZAC) 20 mg capsule  Take 20 mg by mouth daily.               estradiol (ESTRACE) 2 mg tablet  Take 2 mg by mouth daily.               spironolactone (ALDACTONE) 25 mg tablet  Take 50 mg by mouth every twelve (12) hours.               clonazePAM (KLONOPIN) 1 mg tablet  Take 1 mg by mouth every twelve (12) hours.               citalopram (CELEXA) 20 mg tablet  Take 20 mg by mouth nightly.  Follow-up Information               Follow up With  Specialties  Details  Why  Contact Info               Rodney Arnold      Call Rodney Arnold to set up your outpatient appointments for PT and OT.  [phone]              Harrington, Rodney Herter, NP  Nurse Practitioner  In 1 week    [address] [place] [address]   [phone]                   Final discharge diagnoses and brief hospital course   Rodney Arnold??is a 32 y.o.??adult??with PMH of ??Cerebral Palsy. Born man, wants to be referred to male. Recent admitted after a fall, c/o  paralysis and numbness of lower ext. Left AMA 2days ago before full evaluation by Neurology, having had unremarkable MRI w/o L spine, wanted MRI L and T spine with contratst. Main differential was malingering for drugs. Also declined rehab as recommended  by PT/OT last admission.   Last 2 days states her boyfriend had to literally carry her from one place to another, got frustrated as continues to have pain in lower back, b/l leg paralysis and numbness, her mom, Child psychotherapist and pcp reommended to come back to ED to address his  ongoing neurological problems. States willing to comply with recommendations this time.    Admitted to [facility]. On eval asking for analgesia for his low back pain.  03/28/2017         LE Weakness, unspecified (POA)   #.??B/l lower ext numbness and weakness: improved   - hx cerebral palsy    - symptoms could be due to cerebral palsy vs conversion disorder   - vitals stable, cbc and BMP wnl.   - Appreciate neurology input   - MRI T spine: normal   - psychiatry consulted " She likely has a conversion disorder and would benefit from additional counseling."   - PT/OT   ??   #. Cerebral palsy with spastic diplegia   - baseline mobilise with canes/crutches and sometimes independently 'saw him walking independently few days ago during last admission'.   ??   #. Recent Fall: PT/OT.    ??   #. Chronic Back pain: judicious use of opioid analgesia,  UDS was +ve for opioids. Added flexeril. No opioids on discharge   ??   #.??Sex reassignment therapy -  currently on estrace and spironolactone. ??Prefers to go by "Rodney Arnold"   ??   #. Depression: celexa, klonopin.   ??   #. Tobacco abuse: on nicotine patch   ??   Underweight   Body mass index is 17.43 kg/m??.      Recommendations:   PCP f/u in 1 week   Psychiatry f/u   Avoid opioids   outpt PT/OT      Physical examination at discharge   Visit Vitals      BP  106/67 (BP 1 Location: Right arm, BP Patient Position: At rest)     Pulse  89     Temp  98 ??F (36.7 ??C)     Resp  16     Ht  5\' 6"  (1.676 m)     Wt  49 kg (108 lb)     SpO2  96%  BMI  17.43 kg/m??        Gen: NAD, non-toxic   HEENT: anicteric sclerae, normal conjunctiva, oropharynx clear, MM moist   Neck: supple, trachea midline, no adenopathy   Heart: RRR, no MRG, no JVD, no peripheral edema   Lungs: CTA b/l, non-labored respirations   Abd: soft, NT, ND, BS+, no organomegaly   Extr: warm   Skin: dry, no rash   Neuro: CN II-XII grossly intact, normal speech, moves all extremities   Psych: normal mood, appropriate affect            Pertinent imaging studies:      none        Recent Labs           03/28/17   1053     WBC  6.2     HGB  14.8     HCT  46.3        PLT  166          Recent Labs           03/28/17   1053     NA  138     K  4.3     CL  99     CO2  30     BUN  14     CREA  0.80     GLU  98        CA  9.1        No results for input(s): SGOT, GPT, AP, TBIL, TP, ALB, GLOB, GGT, AML, LPSE in the last 72 hours.      No lab exists for component: AMYP, HLPSE   No results for input(s): INR, PTP, APTT in the last 72 hours.      No lab exists for component: INREXT    No results for input(s): FE, TIBC, PSAT, FERR in the last 72 hours.    No results for input(s): PH, PCO2, PO2 in the last 72 hours.   No results for input(s): CPK, CKMB in the last 72 hours.      No lab exists for component: TROPONINI   No components found for: [facility]      Chronic Diagnoses:        Problem List as of 03/30/2017  Date Reviewed:  02/12/2017                        Codes  Class  Noted -  Resolved             Gait abnormality  ICD-10-CM: R26.9   ICD-9-CM: 781.2    03/28/2017 - Present                       * (Principal) Lower extremity weakness  ICD-10-CM: R29.898   ICD-9-CM: 729.89    03/25/2017 - Present                       Back pain  ICD-10-CM: M54.9   ICD-9-CM: 724.5    03/24/2017 - Present                       Bipolar disorder in remission [facility])  ICD-10-CM: F31.70   ICD-9-CM: 296.80    11/01/2016 - Present                       Spastic diplegic cerebral palsy (HCC)  ICD-10-CM:  G80.1   ICD-9-CM: 343.0    10/15/2016 - Present                       Family history of colon cancer in father  ICD-10-CM: Z80.0   ICD-9-CM: V16.0    10/15/2016 - Present                       FH: breast cancer in relative when <32 years old  ICD-10-CM: Z80.3   ICD-9-CM: V16.3    10/15/2016 - Present                          Time spent on discharge related activities today greater than 30 minutes.         Signed:  Ronnie DerbySushma Saleah Rishel, MD                  Hospitalist, Internal Medicine         Cc: Lorri FrederickHarrington, Pamela G, NP

## 2017-04-02 LAB — EKG 12-LEAD
Atrial Rate: 53 {beats}/min
P Axis: 71 degrees
P-R Interval: 132 ms
Q-T Interval: 402 ms
QRS Duration: 92 ms
QTc Calculation (Bazett): 377 ms
R Axis: 84 degrees
T Axis: 75 degrees
Ventricular Rate: 53 {beats}/min

## 2017-04-02 LAB — EKG, 12 LEAD, INITIAL
Atrial Rate: 53 {beats}/min
Calculated P Axis: 71 degrees
Calculated R Axis: 84 degrees
Calculated T Axis: 75 degrees
P-R Interval: 132 ms
Q-T Interval: 402 ms
QRS Duration: 92 ms
QTC Calculation (Bezet): 377 ms
Ventricular Rate: 53 {beats}/min

## 2017-04-06 ENCOUNTER — Ambulatory Visit
Admit: 2017-04-06 | Discharge: 2017-04-06 | Payer: PRIVATE HEALTH INSURANCE | Attending: Family | Primary: Adolescent Medicine

## 2017-04-06 DIAGNOSIS — R296 Repeated falls: Secondary | ICD-10-CM

## 2017-04-06 NOTE — Progress Notes (Signed)
Chief Complaint   Patient presents with   ??? Hospital Follow Up     03/28/17 [place] and 04/05/17 [facility]     Patient went in on 03/28/17 for a fall.Patient c/o pain in lower back.    Visit Vitals  BP 122/80 (BP 1 Location: Right arm, BP Patient Position: Sitting)   Pulse (!) 105   Temp 98.4 ??F (36.9 ??C) (Oral)   Resp 16   Ht 5\' 6"  (1.676 m)   Wt 111 lb 9.6 oz (50.6 kg)   SpO2 95%   BMI 18.01 kg/m??     1. Have you been to the ER, urgent care clinic since your last visit?  Hospitalized since your last visit?Yes  2. Have you seen or consulted any other health care providers outside of the [facility] since your last visit?  Include any pap smears or colon screening. Neurology

## 2017-04-06 NOTE — Progress Notes (Signed)
This note will not be viewable in MyChart.      Rodney Arnold is a  32 y.o. adult presents for visit.    Chief Complaint   Patient presents with   ??? Hospital Follow Up     03/28/17 [place] and 04/05/17 [facility]     HPI    Transition Care Management:    Admission: 03/28/17  Discharge: 03/30/17  Location: [facility]  Diagnosis: Lower extremity weakness, spastic dysplasia, cerebral palsy, recent fall, back pain.  Nurse Navigator note: reviewed    We reviewed the imaging, and notes from hospitalization..  She presented to emergency department after falling with complaint of paralysis, pain, N/T to her bilateral lower extremities from waist down.  Lumbar and thoracic MRIs showed no acute abnormalities.  She left the hospital AMA before a full evaluation by neurology was completed.  Patient stated she left AMA due to derogatory comments directed at her and her husband.  Her symptoms are are improving.  She is taking her muscle relaxers as directed & without any side effects.  On March 18 patient fell again while at home and [facility] and discharged the following day.  Additional imaging was performed.  Notes are not available for review.  She is taking gabapentin for pain.  Patient reports she will [facility]. She is scheduled on April 19, 2017 with [facility].     Patient does not have her crutches to assist with ambulation.  Our nurse navigator spoke with [facility] on February 28, 2017 who is able to provide delivery of the crutches by [employer].  [facility] attempted to contact the patient to schedule delivery and was unable to reach her after several attempts.  Patient is followed by psychiatry for bipolar depression.    Review of Systems   Cardiovascular: Negative for chest pain.   Musculoskeletal: Positive for back pain, falls and myalgias.   Neurological: Positive for sensory change and weakness.    Psychiatric/Behavioral: Positive for depression (Bi-polar- in remission).          Visit Vitals  BP 122/80 (BP 1 Location: Right arm, BP Patient Position: Sitting)   Pulse (!) 105   Temp 98.4 ??F (36.9 ??C) (Oral)   Resp 16   Ht 5\' 6"  (1.676 m)   Wt 111 lb 9.6 oz (50.6 kg)   SpO2 95%   BMI 18.01 kg/m??     Physical Exam   Constitutional: She is oriented to person, place, and time. She appears well-developed. No distress.   thin   HENT:   Head: Normocephalic and atraumatic.   Right Ear: External ear normal.   Left Ear: External ear normal.   Eyes: Conjunctivae are normal.   Cardiovascular: Normal rate, regular rhythm and normal heart sounds.   Pulmonary/Chest: Effort normal and breath sounds normal. She has no wheezes.   Musculoskeletal: She exhibits no edema.   Neurological: She is alert and oriented to person, place, and time. She exhibits abnormal muscle tone. Gait abnormal.   Reflex Scores:       Bicep reflexes are 2+ on the right side and 2+ on the left side.       Patellar reflexes are 0 on the right side and 0 on the left side.  Skin: Skin is warm and dry.   Psychiatric: She has a normal mood and affect. Her behavior is normal.   Nursing  note and vitals reviewed.      MRI Results (most recent):  Results from Hospital Encounter encounter on 03/28/17   MRI [facility] SPINE W WO CONT    Narrative INDICATION: 32 year old with history of cerebral palsy with weakness and  numbness in the legs. Was seen in the emergency department 5 days ago for  similar symptoms. Had noncontrast MRI of lumbar spine that showed no acute  process. Has been experiencing pain in the lower extremities of the past 2 days  with weakness and numbness in the legs. Has had difficulty walking because of  pain, weakness, numbness. When seen in emergency department last on 03/24/2017,  patient reported tripping on feet fall backwards landing on the lower back.    COMPARISON: Lumbar MRI 03/24/2017     FINDINGS: Sagittal T1-weighted spin-echo, sagittal T2-weighted fast spin-echo,  sagittal inversion recovery, axial T1-weighted spin-echo, axial T2 weighted fast  spin echo, and post IV-contrast enhanced sagittal and axial T1weighted spin echo  MR images of the thoracic spine are obtained. A total of 10 cc Dotarem was  administered for the study.    FINDINGS: Thoracic cord signal and enhancement are normal. There is normal  vertebral body height, bone signal and bone enhancement. Alignment is anatomic.  No thoracic disc or facet abnormality is shown. There is no thoracic canal or  foraminal stenosis. No paraspinal soft tissue mass, collection or enhancement  abnormality is demonstrated.      Impression IMPRESSION: Normal study.             Patient Active Problem List    Diagnosis Date Noted   ??? Recurrent falls while walking 04/06/2017   ??? Gait abnormality 03/28/2017   ??? Lower extremity weakness 03/25/2017   ??? Back pain 03/24/2017   ??? Bipolar disorder in remission (HCC) 11/01/2016   ??? 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 Family history of colon cancer in father 10/15/2016   ??? FH: breast cancer in relative when <57 years old 10/15/2016         ASSESSMENT AND PLAN:      ICD-10-CM ICD-9-CM   1. Recurrent falls while walking R29.6 781.99   2. Weakness of both lower extremities R29.898 729.89   3. Spastic diplegic cerebral palsy (HCC) G80.1 343.0   4. Chronic bilateral low back pain, with sciatica presence unspecified M54.5 724.2    G89.29 338.29   5. Bipolar disorder in remission [facility]) F31.70 296.80     Orders Placed This Encounter   ??? gabapentin (NEURONTIN) 100 mg capsule     Sig: Take  by mouth three (3) times daily.   ??? methocarbamol (ROBAXIN) 500 mg tablet     Sig: Take  by mouth four (4) times daily.     Diagnoses and all orders for this visit:    1. Recurrent falls while walking     -Contact information for [facility] will be provided to  patient to facilitate delivery of crutches.  [phone].    2. Weakness of both lower extremities      -Physical therapy appointment scheduled on April 19, 2017.    3. Spastic diplegic cerebral palsy [facility])       -Follow-up with neurology at [facility].    4. Chronic bilateral low back pain, with sciatica presence unspecified       -Referred to pain management previously.  Provider list given to patient today.      radiology results and schedule of  future radiology studies reviewed with patient      Follow-up for annual physical exam and as needed.      Disclaimer:  Advised her to call back or return to office if symptoms worsen/change/persist.  Discussed expected course/resolution/complications of diagnosis in detail with patient.    Medication risks/benefits/alternatives discussed with patient.  She was given an after visit summary which includes diagnoses, current medications, & vitals.     Discussed patient instructions and advised to read to all patient instructions regarding care.     She expressed understanding with the diagnosis and plan.

## 2017-04-06 NOTE — Telephone Encounter (Signed)
Spoke to patient let her know [facility] [phone] is where she needs to f/u on crutches.

## 2017-04-12 ENCOUNTER — Ambulatory Visit: Admit: 2017-04-12 | Discharge: 2017-04-12 | Payer: MEDICARE | Attending: Family | Primary: Adolescent Medicine

## 2017-04-12 DIAGNOSIS — G801 Spastic diplegic cerebral palsy: Secondary | ICD-10-CM

## 2017-04-12 NOTE — Progress Notes (Signed)
This note will not be viewable in MyChart.      Rodney Arnold is a  32 y.o. adult presents for visit.    Chief Complaint   Patient presents with   ??? Labs   ??? Follow Up Chronic Condition     HPI    Patient presents for chronic care and routine blood work.  She is accompanied by the supervising mental health professional Rodney Arnold of [facility] who is filling in for Rodney Arnold qualified mental health professional who will be working with Rodney Arnold in the future.  Patient tripped in the hallway walking to the exam room.  She is not using the crutches prescribed for her a couple of months ago.  The telephone number from [facility] to arrange crutches delivery was provided at her last office visit but she has not yet made these arrangements.  Patient states her contact information has changed.  Current phone number is [phone].  Address is [address] [place] [address].  Patient has new insurance as well.  Will update the front desk at checkout.  Appointment is scheduled with physical therapy on April 19, 2017.  She was initially scheduled for a complete physical with labs but with insurance coverage limitations will schedule a Welcome to Medicare visit next week.    The patient's aunt who raised her is face timing today to add to patient's family past medical history.  She reports patient's father died of a heart attack at age 37.  He had multiple complex chronic medical conditions, including bipolar and he was a heavy smoker.  The patient smokes cigarettes and vapes, not ready to stop at this time.     Patient is transgender woman and followed by [facility] for medication management.  She started taking estrogen and spironolactone last year after being released from prison.  She is followed by [facility] and carries a diagnosis of bipolar depression.  She has a history of substance abuse with pills.  She has multiple missing teeth and reports  she had her teeth pulled to obtain pain medications.  Patient is underweight although she reports she is able to eat and drink normally.    Patient will follow-up with [facility] to manage cerebral palsy.  Patient also endorses the diagnosis of multiple sclerosis.    Reports she tested negative for HIV a couple of weeks ago.     Patient is not fasting and will return for labs when fasting.      Review of Systems   Constitutional: Negative for chills and fever.   HENT: Positive for congestion (allergies).    Eyes: Negative for blurred vision, double vision, photophobia, pain, discharge and redness.        Followed by [facility].    Respiratory: Negative for cough, hemoptysis, sputum production, shortness of breath and wheezing.    Cardiovascular: Negative for chest pain, palpitations and leg swelling.   Gastrointestinal: Positive for abdominal pain and diarrhea (loose stool X 1 year). Negative for blood in stool.   Genitourinary: Positive for dysuria (chronic).        Hesitancy   Musculoskeletal: Positive for falls and myalgias.   Skin: Negative for itching and rash.   Neurological: Positive for sensory change and focal weakness.   Endo/Heme/Allergies: Positive for environmental allergies.   Psychiatric/Behavioral: Positive for depression (followed by [facility]) and substance abuse (opiods previously). Negative for suicidal ideas. The patient is nervous/anxious. The patient  does not have insomnia.           Visit Vitals  BP 124/71 (BP 1 Location: Left arm, BP Patient Position: Sitting)   Pulse 95   Temp 98.3 ??F (36.8 ??C) (Oral)   Resp 16   Ht 5\' 6"  (1.676 m)   Wt 108 lb 9.6 oz (49.3 kg)   SpO2 99%   BMI 17.53 kg/m??     Physical Exam   Constitutional: She is oriented to person, place, and time. No distress.   Thin   HENT:   Head: Normocephalic and atraumatic.   Right Ear: Hearing, tympanic membrane and ear canal normal.    Left Ear: Hearing, tympanic membrane and ear canal normal.   Nose: Nose normal.   Mouth/Throat: Uvula is midline, oropharynx is clear and moist and mucous membranes are normal.   Eyes: Pupils are equal, round, and reactive to light. Conjunctivae and EOM are normal.   Neck: No tracheal deviation present. No thyromegaly present.   Cardiovascular: Normal rate, regular rhythm and normal heart sounds.   Pulmonary/Chest: Effort normal and breath sounds normal. She has no wheezes.   Abdominal: Soft. Bowel sounds are normal. There is no tenderness.   Musculoskeletal: She exhibits no edema.        Right hip: She exhibits decreased strength, tenderness and bony tenderness.        Left hip: She exhibits decreased range of motion and decreased strength.        Right knee: She exhibits decreased range of motion (weak flexion).        Left knee: She exhibits decreased range of motion (weak flexion).        Right ankle: She exhibits decreased range of motion (minimal dorsiflex).        Left ankle: She exhibits decreased range of motion (decreased dorsiflex).   Lymphadenopathy:     She has no cervical adenopathy.   Neurological: She is alert and oriented to person, place, and time. A sensory deficit (decreased sensation knees to feet BL.) is present. She exhibits abnormal muscle tone. Gait abnormal.   Reflex Scores:       Bicep reflexes are 2+ on the right side and 2+ on the left side.       Patellar reflexes are 0 on the right side and 0 on the left side.  Skin: Skin is warm and dry. No laceration noted. She is not diaphoretic.        Multiple tattoos   Psychiatric: She has a normal mood and affect. Her behavior is normal.   Nursing note and vitals reviewed.          Patient Active Problem List    Diagnosis Date Noted   ??? Recurrent falls while walking 04/06/2017   ??? Gait abnormality 03/28/2017   ??? Lower extremity weakness 03/25/2017   ??? Back pain 03/24/2017   ??? Bipolar disorder in remission (HCC) 11/01/2016    ??? 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 Family history of colon cancer in father 10/15/2016   ??? FH: breast cancer in relative when <63 years old 10/15/2016         ASSESSMENT AND PLAN:      ICD-10-CM ICD-9-CM   1. Spastic diplegic cerebral palsy (HCC) G80.1 343.0   2. Gait abnormality R26.9 781.2   3. Recurrent falls while walking R29.6 781.99   4. Male-to-male transgender person F64.0 58.85   5. Patient underweight R63.6 783.22   6. Bipolar disorder  in remission (HCC) F31.70 296.80   7. FH: heart disease Z82.49 V17.49   8. Tobacco abuse counseling Z71.6 V65.42     305.1   9. Encounter for immunization Z23 V03.89     Orders Placed This Encounter   ??? Pneumococcal Polysaccharide vaccine, 23-Valent, Adult or Immunocompromised   ??? TSH REFLEX TO T4     Standing Status:   Future     Standing Expiration Date:   10/13/2017   ??? CBC W/O DIFF     Standing Status:   Future     Standing Expiration Date:   10/13/2017   ??? METABOLIC PANEL, BASIC     Standing Status:   Future     Standing Expiration Date:   10/13/2017   ??? LIPID PANEL     Standing Status:   Future     Standing Expiration Date:   10/13/2017     Diagnoses and all orders for this visit:    1. Spastic diplegic cerebral palsy (HCC)  -     TSH REFLEX TO T4; Future  -     CBC W/O DIFF; Future  -     METABOLIC PANEL, BASIC; Future  -     LIPID PANEL; Future    2. Gait abnormality  -     CBC W/O DIFF; Future  -     METABOLIC PANEL, BASIC; Future    3. Recurrent falls while walking        -   Contact information for [facility] provided to the patient again.  Patient instructed to call to arrange home delivery of crutches.  Keep appointment with physical therapy on April 19, 2017.    4. Male-to-male transgender person         Followed by [facility]    5. Patient underweight  -     TSH REFLEX TO T4; Future  -     CBC W/O DIFF; Future  -     METABOLIC PANEL, BASIC; Future  -     LIPID PANEL; Future        -      Recommend adding Ensure or Boost twice daily.    6. Bipolar disorder in remission [facility](HCC)       -    Follow-up with [facility].    7. FH: heart disease  -     METABOLIC PANEL, BASIC; Future  -     LIPID PANEL; Future    8. Tobacco abuse counseling    9. Encounter for immunization  -     PNEUMOCOCCAL POLYSACCHARIDE VACCINE, 23-VALENT, ADULT OR IMMUNOSUPPRESSED PT DOSE,    Recommended scheduling a dental appointment due to multiple absent teeth.    lab results and schedule of future lab studies reviewed with patient  reviewed diet, exercise and weight control  very strongly urged to quit smoking to reduce cardiovascular risk  W will have fasting labs drawn prior to next office visit.        Follow-up and Dispositions    ?? Return in about 2 weeks (around 04/26/2017), or if symptoms worsen or fail to improve, for Medicare wellness visit.Marland Kitchen.           Disclaimer:  Advised her to call back or return to office if symptoms worsen/change/persist.  Discussed expected course/resolution/complications of diagnosis in detail with patient.    Medication risks/benefits/alternatives discussed with patient.  She was given an after visit summary which includes diagnoses, current medications, & vitals.  Discussed patient instructions and advised to read to all patient instructions regarding care.     She expressed understanding with the diagnosis and plan.

## 2017-04-12 NOTE — Progress Notes (Signed)
Chief Complaint   Patient presents with   ??? Labs   ??? Follow Up Chronic Condition

## 2017-04-20 ENCOUNTER — Encounter: Attending: Family | Primary: Adolescent Medicine

## 2017-04-25 ENCOUNTER — Encounter: Attending: Surgery | Primary: Adolescent Medicine

## 2017-04-29 ENCOUNTER — Ambulatory Visit: Admit: 2017-04-29 | Discharge: 2017-04-29 | Payer: MEDICARE | Attending: Family | Primary: Adolescent Medicine

## 2017-04-29 DIAGNOSIS — L03112 Cellulitis of left axilla: Secondary | ICD-10-CM

## 2017-04-29 MED ORDER — OXYCODONE-ACETAMINOPHEN 5 MG-325 MG TAB
5-325 mg | ORAL_TABLET | ORAL | 0 refills | Status: AC | PRN
Start: 2017-04-29 — End: 2017-05-02

## 2017-04-29 NOTE — Progress Notes (Signed)
This note will not be viewable in MyChart.      Rodney Arnold is a  32 y.o. adult presents for visit.    Chief Complaint   Patient presents with   ??? Hospital Follow Up     Sepsis      HPI  Hospital Follow Up  Rodney Arnold( [name]) is seen for follow up from [facility]-04/26/17.  We reviewed the Patient Discharge summary.  She presented with hidradinitis/cellulitis/sepsis.  She was treated with aggressive IV antibiotics.  She is taking her p.o. doxycycline 100 mg every 12 hours and applying topical clindamycin gel twice daily to left axilla with dressing changes as directed.  Reports diarrhea as a side effect to doxycycline.  Home health nurse scheduled twice per week for dressing changes.  She reports infection has improved but continues to be in significant pain in her left axilla at the site of cellulitis.  Pain level 9 out of 10 on a 1-10 scale.  Patient was not discharged with a prescription for pain medication.  Her qualified mental health provider will schedule an immediate appointment [facility] where she will be followed for continued care of hidradenitis suppurativa.     Review of Systems   Constitutional: Positive for fever (100 F last night). Negative for chills.   Respiratory: Negative for sputum production.    Cardiovascular: Negative for chest pain and palpitations.   Gastrointestinal: Positive for diarrhea. Negative for heartburn.          Visit Vitals  BP 125/85 (BP 1 Location: Left arm, BP Patient Position: Sitting)   Pulse 85   Temp 97.7 ??F (36.5 ??C) (Oral)   Resp 14   Ht 5\' 6"  (1.676 m)   Wt 104 lb 3.2 oz (47.3 kg)   SpO2 98%   BMI 16.82 kg/m??     Physical Exam   Constitutional: She is oriented to person, place, and time.   Thin and frail appearance.   HENT:   Head: Normocephalic and atraumatic.   Right Ear: External ear normal.   Left Ear: External ear normal.   Eyes: Conjunctivae are normal.    Cardiovascular: Normal rate, regular rhythm and normal heart sounds.   Pulmonary/Chest: Effort normal and breath sounds normal. She has no wheezes.   Musculoskeletal: She exhibits no edema.   Rollator as needed.   Neurological: She is alert and oriented to person, place, and time. A sensory deficit is present. Gait abnormal.   Skin: Skin is warm.   Erythema, edema and open wound noted to left axilla.  Exquisitely tender to touch.   Psychiatric: She has a normal mood and affect. Her behavior is normal.   Nursing note and vitals reviewed.                Patient Active Problem List    Diagnosis Date Noted   ??? Recurrent falls while walking 04/06/2017   ??? Gait abnormality 03/28/2017   ??? Lower extremity weakness 03/25/2017   ??? Back pain 03/24/2017   ??? Bipolar disorder in remission (HCC) 11/01/2016   ??? 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 Family history of colon cancer in father 10/15/2016   ??? FH: breast cancer in relative when <32 years old 10/15/2016         ASSESSMENT AND PLAN:      ICD-10-CM ICD-9-CM   1. Cellulitis of left axilla L03.112 682.3   2.  Left axillary hidradenitis L73.2 705.83   3. Pain in inferior left upper extremity M79.602 729.5   4. Spastic diplegic cerebral palsy (HCC) G80.1 343.0   5. Weakness of both lower extremities R29.898 729.89   6. Patient underweight R63.6 783.22     Orders Placed This Encounter   ??? doxycycline (MONODOX) 100 mg capsule     Sig: Take 100 mg by mouth two (2) times a day.   ??? acetaminophen (TYLENOL) 325 mg tablet     Sig: Take  by mouth every four (4) hours as needed for Pain.   ??? oxyCODONE-acetaminophen (PERCOCET) 5-325 mg per tablet     Sig: Take 1 Tab by mouth every four (4) hours as needed for Pain for up to 3 days. Max Daily Amount: 6 Tabs.     Dispense:  15 Tab     Refill:  0     Diagnoses and all orders for this visit:    1. Cellulitis of left axilla       -Continue doxycycline 100 mg p.o. every 12 hours.  Dressing changes daily as directed.     2. Left axillary hidradenitis      -Follow-up with [facility] dermatology department.  Schedule appointment today or Monday.    3. Pain in inferior left upper extremity  -     oxyCODONE-acetaminophen (PERCOCET) 5-325 mg per tablet; Take 1 Tab by mouth every four (4) hours as needed for Pain for up to 3 days. Max Daily Amount: 6 Tabs.    4. Spastic diplegic cerebral palsy (HCC)        -   Unchanged.  Followed by neurology.    5. Weakness of both lower extremities      -   Ambulates with a Rollator as needed.  Continued to have difficulty obtaining her crutches from DME company.    6. Patient underweight     -Recommend supplemental protein drinks with meals.    Short course of Percocet prescribed for severe pain in left axilla with dressing changes.    Follow-up and Dispositions    ?? Return if symptoms worsen or fail to improve.           Disclaimer:  Advised her to call back or return to office if symptoms worsen/change/persist.  Discussed expected course/resolution/complications of diagnosis in detail with patient.    Medication risks/benefits/alternatives discussed with patient.  She was given an after visit summary which includes diagnoses, current medications, & vitals.     Discussed patient instructions and advised to read to all patient instructions regarding care.     She expressed understanding with the diagnosis and plan.

## 2017-04-29 NOTE — Progress Notes (Signed)
Chief Complaint   Patient presents with   ??? Hospital Follow Up     Sepsis      Patient went to hospital for pain and sores under left arm.She had a fever 103.7.She was taken by ambulance to [facility] on 04/22/17 -04/26/17.Patient is having home health 2 times a week.    Visit Vitals  BP 125/85 (BP 1 Location: Left arm, BP Patient Position: Sitting)   Pulse 85   Temp 97.7 ??F (36.5 ??C) (Oral)   Resp 14   Ht 5\' 6"  (1.676 m)   Wt 104 lb 3.2 oz (47.3 kg)   SpO2 98%   BMI 16.82 kg/m??     1. Have you been to the ER, urgent care clinic since your last visit?  Hospitalized since your last visit?Yes 04/22/17-04/26/17    2. Have you seen or consulted any other health care providers outside of the [facility] since your last visit?  Include any pap smears or colon screening. no

## 2017-05-18 ENCOUNTER — Inpatient Hospital Stay
Admit: 2017-05-18 | Discharge: 2017-05-19 | Disposition: A | Discharge status: Home / Self Care | Payer: MEDICARE | Attending: Emergency Medicine

## 2017-05-18 ENCOUNTER — Emergency Department: Admit: 2017-05-18 | Payer: MEDICARE | Primary: Adolescent Medicine

## 2017-05-18 ENCOUNTER — Emergency Department

## 2017-05-18 DIAGNOSIS — R251 Tremor, unspecified: Secondary | ICD-10-CM

## 2017-05-18 LAB — CBC WITH AUTOMATED DIFF
ABS. BASOPHILS: 0.1 10*3/uL (ref 0.0–0.1)
ABS. EOSINOPHILS: 0.1 10*3/uL (ref 0.0–0.4)
ABS. IMM. GRANS.: 0 10*3/uL (ref 0.00–0.04)
ABS. LYMPHOCYTES: 2 10*3/uL (ref 0.8–3.5)
ABS. MONOCYTES: 0.4 10*3/uL (ref 0.0–1.0)
ABS. NEUTROPHILS: 2.7 10*3/uL (ref 1.8–8.0)
ABSOLUTE NRBC: 0 10*3/uL (ref 0.00–0.01)
BASOPHILS: 1 % (ref 0–1)
EOSINOPHILS: 2 % (ref 0–7)
HCT: 40.1 % (ref 36.6–50.3)
HGB: 12.9 g/dL (ref 12.1–17.0)
IMMATURE GRANULOCYTES: 0 % (ref 0.0–0.5)
LYMPHOCYTES: 38 % (ref 12–49)
MCH: 29.4 PG (ref 26.0–34.0)
MCHC: 32.2 g/dL (ref 30.0–36.5)
MCV: 91.3 FL (ref 80.0–99.0)
MONOCYTES: 8 % (ref 5–13)
MPV: 10.1 FL (ref 8.9–12.9)
NEUTROPHILS: 51 % (ref 32–75)
NRBC: 0 PER 100 WBC
PLATELET: 204 10*3/uL (ref 150–400)
RBC: 4.39 M/uL (ref 4.10–5.70)
RDW: 13.3 % (ref 11.5–14.5)
WBC: 5.3 10*3/uL (ref 4.1–11.1)

## 2017-05-18 LAB — SAMPLES BEING HELD

## 2017-05-18 MED ORDER — SODIUM CHLORIDE 0.9% BOLUS IV
0.9 % | Freq: Once | INTRAVENOUS | Status: AC
Start: 2017-05-18 — End: 2017-05-18
  Administered 2017-05-18: via INTRAVENOUS

## 2017-05-18 MED ORDER — LORAZEPAM 2 MG/ML IJ SOLN
2 mg/mL | INTRAMUSCULAR | Status: DC
Start: 2017-05-18 — End: 2017-05-19
  Administered 2017-05-18: 23:00:00 via INTRAVENOUS

## 2017-05-18 MED ORDER — ACETAMINOPHEN 325 MG TABLET
325 mg | ORAL | Status: DC
Start: 2017-05-18 — End: 2017-05-19

## 2017-05-18 MED FILL — LORAZEPAM 2 MG/ML IJ SOLN: 2 mg/mL | INTRAMUSCULAR | Qty: 1

## 2017-05-18 MED FILL — SODIUM CHLORIDE 0.9 % IV: INTRAVENOUS | Qty: 1000

## 2017-05-18 NOTE — ED Provider Notes (Signed)
32 y.o. adult with past medical history significant for cerebral palsy, MS, bipolar 1 disorder, asthma, who presents to the ED via EMS, with chief complaint of seizure. Pt reports that she fell "~25 feet" down the stairs at her home earlier today, injuring her back. She describes her back pain as "fire from the neck down". Notes that she called EMS after fall because she "could not feel his legs" and "could not move". Pt states that EMS brought her to [facility], where "people were rude to me". She states that she left [facility] and had a seizure on the side of the road, and started to call 911 but needed assistance from bystander to do so. EMS personnel report that found the patient on the side of the road, and gave her Versed 10 mg. They state that patient denied h/o prior seizures. EMS confirms that patient was able to walk out of [facility], and did so because he was denied pain medication. Pt requests pain medication here in the ED. Patient is male, but uses and prefers she/her pronouns. There are no other acute medical concerns at this time.    Social hx: Positive for Tobacco use (current every day smoker, 1 pack/day for 11 years); Positive for EtOH use (rarely); Negative for Illicit Drug use  PCP: Lorri Frederick, NP    Note written by Myrle Sheng, Scribe, as dictated by Shirl Harris, MD 7:36 PM    The history is provided by the patient, the EMS personnel and medical records. No language interpreter was used.        Past Medical History:   Diagnosis Date   ??? Asthma due to environmental allergies    ??? Bipolar 1 disorder (HCC)    ??? Cat allergies    ??? Cerebral palsy (HCC)    ??? MS (multiple sclerosis) (HCC)        Past Surgical History:   Procedure Laterality Date   ??? HX ORTHOPAEDIC  1998    quadricep lengthening surgery         Family History:   Problem Relation Age of Onset   ??? Psychiatric Disorder Mother    ??? Breast Cancer Mother    ??? Alcohol abuse Mother    ??? Hypertension Mother    ??? Elevated Lipids Mother     ??? Arthritis-rheumatoid Mother    ??? Colon Cancer Father    ??? Alcohol abuse Father    ??? Hypertension Father    ??? Diabetes Father    ??? Heart Failure Father    ??? Psychiatric Disorder Father    ??? Heart Disease Father    ??? Coronary Artery Disease Father    ??? Arthritis-osteo Father    ??? Prostate Cancer Paternal Uncle        Social History     Socioeconomic History   ??? Marital status: MARRIED     Spouse name: Not on file   ??? Number of children: Not on file   ??? Years of education: Not on file   ??? Highest education level: Not on file   Occupational History   ??? Not on file   Social Needs   ??? Financial resource strain: Not on file   ??? Food insecurity:     Worry: Not on file     Inability: Not on file   ??? Transportation needs:     Medical: Not on file     Non-medical: Not on file   Tobacco Use   ??? Smoking  status: Current Every Day Smoker     Packs/day: 1.00     Years: 11.00     Pack years: 11.00   ??? Smokeless tobacco: Never Used   Substance and Sexual Activity   ??? Alcohol use: Yes     Comment: very rarely   ??? Drug use: No   ??? Sexual activity: Not Currently   Lifestyle   ??? Physical activity:     Days per week: Not on file     Minutes per session: Not on file   ??? Stress: Not on file   Relationships   ??? Social connections:     Talks on phone: Not on file     Gets together: Not on file     Attends religious service: Not on file     Active member of club or organization: Not on file     Attends meetings of clubs or organizations: Not on file     Relationship status: Not on file   ??? Intimate partner violence:     Fear of current or ex partner: Not on file     Emotionally abused: Not on file     Physically abused: Not on file     Forced sexual activity: Not on file   Other Topics Concern   ??? Not on file   Social History Narrative   ??? Not on file         ALLERGIES: Codeine; Penicillins; Toradol [ketorolac]; Grape; Peanut; Pineapple; and Tramadol    Review of Systems   Constitutional: Negative for fever.    HENT: Negative for facial swelling and nosebleeds.    Eyes: Negative for pain.   Respiratory: Negative for cough, chest tightness and shortness of breath.    Cardiovascular: Negative for chest pain and leg swelling.   Gastrointestinal: Negative for abdominal pain, diarrhea and vomiting.   Endocrine: Negative for polyuria.   Genitourinary: Negative for difficulty urinating and flank pain.   Musculoskeletal: Positive for back pain. Negative for arthralgias.   Skin: Negative for color change.   Allergic/Immunologic: Negative for immunocompromised state.   Neurological: Positive for seizures. Negative for dizziness and headaches.   Hematological: Does not bruise/bleed easily.   Psychiatric/Behavioral: Negative for agitation.   All other systems reviewed and are negative.      Vitals:    05/18/17 1927   BP: 116/81   Pulse: 74   Resp: 12   Temp: 98.1 ??F (36.7 ??C)   SpO2: 99%   Weight: 47.6 kg (105 lb)   Height:  (1.676 m)            Physical Exam   Constitutional: She is oriented to person, place, and time. She appears well-developed and well-nourished.   Awake, conversational.   HENT:   Head: Normocephalic and atraumatic.   Right Ear: External ear normal.   Left Ear: External ear normal.   Nose: Nose normal.   Mouth/Throat: Oropharynx is clear and moist.   Eyes: Pupils are equal, round, and reactive to light. EOM are normal. No scleral icterus.   Neck: Normal range of motion. Neck supple. No JVD present. No tracheal deviation present. No thyromegaly present.   Cardiovascular: Normal rate, regular rhythm, normal heart sounds and intact distal pulses. Exam reveals no friction rub.   No murmur heard.  Pulmonary/Chest: Effort normal and breath sounds normal. No stridor. No respiratory distress. She has no wheezes. She has no rales. She exhibits no tenderness.   Abdominal: Soft. Bowel sounds  are normal. She exhibits no distension. There is no tenderness. There is no rebound and no guarding.    Musculoskeletal: Normal range of motion. She exhibits no edema or tenderness.   Tenderness along T- and L-spine to palpation. No step-off or deformities. Moving all four extremities. No pain with ROM of extremities.   Lymphadenopathy:     She has no cervical adenopathy.   Neurological: She is alert and oriented to person, place, and time. She has normal reflexes. No cranial nerve deficit. Coordination normal.   Cranial nerves 2-12 are intact. Strength 5/5 and normal in biceps, triceps and hand grips bilaterally. Strength 5/5 in plantar and dorsi flexion of feet bilaterally. Normal sensation in arms and legs bilaterally to light touch.      Skin: Skin is warm and dry. No rash noted. No erythema.   Psychiatric: Her behavior is normal. Judgment and thought content normal.   Strange affect.   Nursing note and vitals reviewed.  Note written by Myrle Sheng, Scribe, as dictated by Shirl Harris, MD 7:36 PM    MDM  Number of Diagnoses or Management Options  Drug-seeking behavior:   Episode of shaking:   Malingering:   Diagnosis management comments: 32 yo WM presents after presumed seizure. Pt had admission 2 months ago for weakness in legs. Today he says he fell down some steps. Then went to [facility] and was discharged. Pt w/ seizure. Will check labs and CT head and c spine and xrays of lumbar and T spine. Will reassess shortly.   Some concerns for malingering and secondary gain.         Amount and/or Complexity of Data Reviewed  Clinical lab tests: ordered and reviewed  Tests in the radiology section of CPT??: ordered and reviewed  Tests in the medicine section of CPT??: ordered and reviewed  Decide to obtain previous medical records or to obtain history from someone other than the patient: yes  Review and summarize past medical records: yes  Independent visualization of images, tracings, or specimens: yes           Procedures    PROGRESS NOTE:  8:07 PM  Patient was having shaking episodes and turned to side, with doctor in  room. This could of been a seizure, but hard to tell. Heart rate stayed around 85-90 bpm during episode, which is not typical for seizure. Shaking stopped on own without medication.    PROGRESS NOTE:  9:14 PM  Patient started shaking again with doctor in room. Heart rate in 70s. Hard to tell seizure vs. Malingering. Will not give narcotics. Symptoms seems to be more malingering, patient will open eyes when not being watched. Not acidotic on labs which is non consistent with seizures.    PROGRESS NOTE:  9:21 PM  Patient started demanding pain medication, and walked out after not getting narcotics. He did not receive Keppra before leaving.    I am highly suspicious of drug seeking behavior and malingering.     Pt ended up walking out in no distress when no narcotics were given    Usually w/ seizures that are real, pt is confused afterwards (he was not) and HR is usually elevated (his was not)

## 2017-05-18 NOTE — ED Notes (Signed)
MD at bedside. Patient alert and oriented at this time. Reports falling down 25 stairs this morning and being transported to [facility] for back pain. After being discharged from [facility] patient was walking when he had a seizure.

## 2017-05-18 NOTE — ED Notes (Signed)
Pt refusing tylenol and states she is allergic to NSAIDS.

## 2017-05-18 NOTE — ED Notes (Signed)
Verbal shift change report given to Zettie Cooley., RN (oncoming nurse) by Pixie Casino., RN (offgoing nurse). Report included the following information SBAR, Kardex and ED Summary.

## 2017-05-18 NOTE — ED Notes (Signed)
Discharge instructions discussed with pt by provider. Pt verbalized understanding. Pt ambulatory from department, gait steady.     Pt refused discharge VS.

## 2017-05-18 NOTE — Telephone Encounter (Signed)
Patient called the after hoursline stating that she had fallen down 25 steps and she states EMS took her to [facility] to the emergency room.  While there, she states she was treated rudely and was denied pain medication.  She states she left the emergency room and is now sitting out front of the ER in a wheelchair.  She is asking for advice on where to be seen at this time as she is experiencing pain to her lower extremities.  I advised her to work with the case manager in the emergency room who would set up transportation to [facility] ER.  Patient is in agreement with plan.  Will update patient's PCP when she returns the office next Monday, May 6.

## 2017-05-18 NOTE — ED Notes (Signed)
Patient alert and oriented at this time. Reports falling down 25 stairs this morning and is having pain to legs.

## 2017-05-18 NOTE — ED Notes (Signed)
Responded to callbell, pt had removed IV and was getting dressed. Pt arm bleeding from IV site, helped pt bandage IV site. Pt stated they wanted to leave. Dr Joycelyn Rua informed and pt discharged. Pt ambulatory out of ED without assistance.

## 2017-05-19 LAB — METABOLIC PANEL, BASIC
Anion gap: 5 mmol/L (ref 5–15)
BUN/Creatinine ratio: 15 (ref 12–20)
BUN: 12 MG/DL (ref 6–20)
CO2: 28 mmol/L (ref 21–32)
Calcium: 8.4 MG/DL — ABNORMAL LOW (ref 8.5–10.1)
Chloride: 106 mmol/L (ref 97–108)
Creatinine: 0.79 MG/DL (ref 0.70–1.30)
GFR est AA: >60 mL/min/{1.73_m2} (ref >60)
GFR est non-AA: >60 mL/min/{1.73_m2} (ref >60)
Glucose: 81 mg/dL (ref 65–100)
Potassium: 3.4 mmol/L — ABNORMAL LOW (ref 3.5–5.1)
Sodium: 139 mmol/L (ref 136–145)

## 2017-05-19 LAB — HEPATIC FUNCTION PANEL
A-G Ratio: 1.1 (ref 1.1–2.2)
ALT (SGPT): 14 U/L (ref 12–78)
AST (SGOT): 16 U/L (ref 15–37)
Albumin: 3.6 g/dL (ref 3.5–5.0)
Alk. phosphatase: 73 U/L (ref 45–117)
Bilirubin, direct: 0.1 MG/DL (ref 0.0–0.2)
Bilirubin, total: 0.4 MG/DL (ref 0.2–1.0)
Globulin: 3.3 g/dL (ref 2.0–4.0)
Protein, total: 6.9 g/dL (ref 6.4–8.2)

## 2017-05-19 LAB — URINALYSIS W/MICROSCOPIC
Bacteria: NEGATIVE /hpf
Bilirubin: NEGATIVE
Blood: NEGATIVE
Glucose: NEGATIVE mg/dL
Ketone: NEGATIVE mg/dL
Leukocyte Esterase: NEGATIVE
Nitrites: NEGATIVE
Protein: 30 mg/dL — AB
Specific gravity: <1.005 (ref 1.003–1.030)
Urobilinogen: 0.2 EU/dL (ref 0.2–1.0)
pH (UA): 5.5 (ref 5.0–8.0)

## 2017-05-19 LAB — TROPONIN I: Troponin-I, Qt.: <0.05 ng/mL (ref <0.05)

## 2017-05-19 LAB — MAGNESIUM: Magnesium: 2.2 mg/dL (ref 1.6–2.4)

## 2017-05-19 LAB — CK W/ REFLX CKMB: CK: 395 U/L — ABNORMAL HIGH (ref 39–308)

## 2017-05-19 LAB — CK-MB,QUANT.: CK - MB: <1 NG/ML (ref <3.6)

## 2017-05-19 LAB — URINE CULTURE HOLD SAMPLE

## 2017-05-19 MED ORDER — SODIUM CHLORIDE 0.9 % IV
500 mg/5 mL | INTRAVENOUS | Status: DC
Start: 2017-05-19 — End: 2017-05-18

## 2017-05-19 MED FILL — LEVETIRACETAM 500 MG/5 ML IV SOLN: 500 mg/5 mL | INTRAVENOUS | Qty: 10

## 2017-05-24 ENCOUNTER — Ambulatory Visit: Admit: 2017-05-24 | Discharge: 2017-05-24 | Payer: MEDICARE | Attending: Family | Primary: Adolescent Medicine

## 2017-05-24 ENCOUNTER — Encounter: Attending: Family | Primary: Adolescent Medicine

## 2017-05-24 DIAGNOSIS — R2681 Unsteadiness on feet: Secondary | ICD-10-CM

## 2017-05-24 MED ORDER — ACETAMINOPHEN 500 MG TAB
500 mg | ORAL_TABLET | Freq: Three times a day (TID) | ORAL | 1 refills | Status: DC | PRN
Start: 2017-05-24 — End: 2017-05-30

## 2017-05-24 NOTE — Telephone Encounter (Signed)
[facility] called in regards to pt. She stated clarification is needed on an order. Please follow up bcn: [phone]

## 2017-05-24 NOTE — Progress Notes (Signed)
This note will not be viewable in MyChart.      Rodney Arnold is a  32 y.o. adult presents for visit.    Chief Complaint   Patient presents with   ??? Hospital Follow Up     [facility] fall down stairs   ??? LOW BACK PAIN   ??? Ankle Pain     right     Patient presents with her sister for follow-up after falling while descending stairs at home on May 22, 2017.  Was evaluated at [facility] and kept for 24-hour hour observation.  She reports discharge diagnosis of fractured right ankle and rib fracture.  She was placed in a right leg brace which she is not wearing today.  Patient reports it was too heavy and she was unable to wear it.  Reports severe right ankle pain and back pain.  Requests pain medication.  This is her second fall this week. She fell on May 1 and was evaluated at [facility] and [facility] emergency departments.  Patient is an established patient with [facility].  She was evaluated on May 20, 2017.  Bilateral hip x-ray impression is degenerative changes.  Lumbar x-ray showed degenerative changes.  She was referred to pain management and advised to follow-up with Robley Fries.  Patient sister is relocating to [place] from [place] to assist patient.        Fall   The history is provided by the patient. The accident occurred 2 days ago. Fall occurred: going down the stairs. She landed on hard floor. Pain location: Back. The pain is severe. She was not ambulatory at the scene. Associated symptoms include extremity weakness. The risk factors include recurrent falls.  The symptoms are aggravated by ambulation. She has tried immobilization, elevation, acetaminophen and rest for the symptoms. The treatment provided mild relief. It is unknown when the patient last had a tetanus shot.     Review of Systems   Musculoskeletal: Positive for extremity weakness.     See HPI for pertinent positives and negatives.    Visit Vitals  BP 98/67 (BP 1 Location: Left arm, BP Patient Position: Sitting)   Pulse (!) 107    Temp 98.9 ??F (37.2 ??C) (Oral)   Resp 16   Ht  (1.676 m)   Wt 106 lb 6.4 oz (48.3 kg)   SpO2 100%   BMI 17.17 kg/m??     Physical Exam   Constitutional: She is oriented to person, place, and time. No distress.   thin   HENT:   Head: Normocephalic and atraumatic.   Right Ear: External ear normal.   Left Ear: External ear normal.   Eyes: Conjunctivae are normal.   Cardiovascular: Normal rate, regular rhythm, normal heart sounds and intact distal pulses.   Pulmonary/Chest: Effort normal and breath sounds normal. She has no wheezes.   Musculoskeletal: She exhibits no edema.        Right hip: She exhibits decreased strength.        Right ankle: She exhibits decreased range of motion.   Unable to dorsi flex-BL -baseline. Right foot TTP. No edema or bruising noted.   Neurological: She is alert and oriented to person, place, and time. She exhibits abnormal muscle tone. Gait abnormal.   Reflex Scores:       Bicep reflexes are 2+ on the right side and 2+ on the left side.       Patellar reflexes are 0 on the right side and 0 on the  left side.  Using rollater seat as wheelchair. Unable to bear weight on right lower extremity.   Skin: Skin is warm and dry.   Psychiatric: She has a normal mood and affect. Her behavior is normal.   Nursing note and vitals reviewed.        Patient Active Problem List    Diagnosis Date Noted   ??? Recurrent falls while walking 04/06/2017   ??? Gait abnormality 03/28/2017   ??? Lower extremity weakness 03/25/2017   ??? Back pain 03/24/2017   ??? Bipolar disorder in remission (HCC) 11/01/2016   ??? 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 Family history of colon cancer in father 10/15/2016   ??? FH: breast cancer in relative when <23 years old 10/15/2016         ASSESSMENT AND PLAN:      ICD-10-CM ICD-9-CM   1. Gait instability R26.81 781.2   2. Right foot pain M79.671 729.5   3. Fall (on) (from) other stairs and steps, initial encounter W10.8XXA E880.9   4. Recurrent falls R29.6 V15.88    5. Lumbar pain M54.5 724.2   6. Weakness of both lower extremities R29.898 729.89   7. Spastic diplegic cerebral palsy (HCC) G80.1 343.0     Orders Placed This Encounter   ??? [facility]     Referral Priority:   Routine     Referral Type:   Home Health Evaluation     Referral Reason:   Continuity of Care     Number of Visits Requested:   1   ??? acetaminophen (TYLENOL) 500 mg tablet     Sig: Take 2 Tabs by mouth every eight (8) hours as needed for Pain. Indications: Pain     Dispense:  30 Tab     Refill:  1     Diagnoses and all orders for this visit:    1. Gait instability  -     REFERRAL TO HOME HEALTH    2. Right foot pain  -     acetaminophen (TYLENOL) 500 mg tablet; Take 2 Tabs by mouth every eight (8) hours as needed for Pain. Indications: Pain       -      Schedule an appointment with your orthopedic provider as soon as possible.  Ice and elevate right foot.    3. Fall (on) (from) other stairs and steps, initial encounter  -     REFERRAL TO HOME HEALTH    4. Recurrent falls  -     REFERRAL TO HOME HEALTH    5. Lumbar pain  -     acetaminophen (TYLENOL) 500 mg tablet; Take 2 Tabs by mouth every eight (8) hours as needed for Pain. Indications: Pain        -     Follow-up with orthopedics.  Schedule an appointment with pain management.    6. Weakness of both lower extremities  -     REFERRAL TO HOME HEALTH    7. Spastic diplegic cerebral palsy (HCC)  -     REFERRAL TO HOME HEALTH        -    Followed by neurology.           PMP reviewed.  15 total prescribers and 6 total pharmacies.  I declined her request for narcotic pain medication and urged patient to follow-up with pain management for chronic pain.    Follow-up and Dispositions    ?? Return if symptoms  worsen or fail to improve.           Disclaimer:  Advised her to call back or return to office if symptoms worsen/change/persist.  Discussed expected course/resolution/complications of diagnosis in detail with patient.     Medication risks/benefits/alternatives discussed with patient.  She was given an after visit summary which includes diagnoses, current medications, & vitals.     Discussed patient instructions and advised to read to all patient instructions regarding care.     She expressed understanding with the diagnosis and plan.

## 2017-05-24 NOTE — Progress Notes (Signed)
Chief Complaint   Patient presents with   ??? Hospital Follow Up     [facility]   ??? LOW BACK PAIN   ??? Ankle Pain     right     Patient fell on 05/22/17 down a flight of stairs and was seen in the ER at [facility].    Visit Vitals  BP 98/67 (BP 1 Location: Left arm, BP Patient Position: Sitting)   Pulse (!) 107   Temp 98.9 ??F (37.2 ??C) (Oral)   Resp 16   Ht  (1.676 m)   Wt 106 lb 6.4 oz (48.3 kg)   SpO2 100%   BMI 17.17 kg/m??     1. Have you been to the ER, urgent care clinic since your last visit?  Hospitalized since your last visit?05/22/17    2. Have you seen or consulted any other health care providers outside of the [facility] since your last visit?  Include any pap smears or colon screening.no

## 2017-05-25 MED ORDER — LORAZEPAM 2 MG/ML IJ SOLN
2 mg/mL | INTRAMUSCULAR | Status: AC
Start: 2017-05-25 — End: 2017-05-19

## 2017-05-25 NOTE — Telephone Encounter (Signed)
Spoke with [facility] yesterday for clarification of patient HH orders. PT/OT/safety evaluation planned for Friday. If patient is discharged home tomorrow then no need to reschedule [facility] evaluation. Please contact HH to update them. Thanks

## 2017-05-25 NOTE — Telephone Encounter (Signed)
Spoke with patient this morning about starting home care, stated he had fallen at the house so is now admited to [facility]

## 2017-05-26 NOTE — Telephone Encounter (Signed)
Confirmed patient id and inquired about what the patient needs.  Patient reports that she fell 40 feet off of a balcony and that she does not have any broken bones but she is in pain and the tylenol that was given is not working.  Patient states that no discharge meds were given.  Advised that all I could do is send a message to the provider.  Pam over heard me speaking to the patient and states not to send the message to her she is not giving her pain medication.  Patient has follow up appointment on Monday with Pam.

## 2017-05-26 NOTE — Telephone Encounter (Signed)
-----   Message from Buren Kos sent at 05/26/2017  1:50 PM EDT -----  Regarding: [name]- NP / telephone  On 5/7 Pt took a 40 foot fall. Pt was [facility] without any medication and is in pain.  Pt does have a TOC on 5/13 but would need something for the pain before then to be [facility] [address]. Pts contact [phone] (she prefers the name Rodney Arnold## please put in the notes for every appointment.)

## 2017-05-26 NOTE — Telephone Encounter (Signed)
Pt called and stated she was in the hospital from falling off of a balcony. Pt disconnected, please return phone call for further clarification. Bcn: [phone]

## 2017-05-26 NOTE — Telephone Encounter (Signed)
Please call the patient regarding her fall. And pain medicaiton

## 2017-05-26 NOTE — Telephone Encounter (Signed)
Patient directed back to [facility] providers where she was evaluated/treated after the fall.

## 2017-05-27 ENCOUNTER — Encounter: Primary: Adolescent Medicine

## 2017-05-27 NOTE — Telephone Encounter (Signed)
Confirmed patient id and advised that Rodney Arnold is not going to give any pain medication.  Patient gave verbal understanding.

## 2017-05-30 ENCOUNTER — Ambulatory Visit: Admit: 2017-05-30 | Discharge: 2017-05-30 | Payer: MEDICARE | Attending: Family | Primary: Adolescent Medicine

## 2017-05-30 ENCOUNTER — Encounter: Attending: Family | Primary: Adolescent Medicine

## 2017-05-30 DIAGNOSIS — R296 Repeated falls: Secondary | ICD-10-CM

## 2017-05-30 MED ORDER — ACETAMINOPHEN 500 MG TAB
500 mg | ORAL_TABLET | Freq: Three times a day (TID) | ORAL | 1 refills | Status: DC | PRN
Start: 2017-05-30 — End: 2017-08-17

## 2017-05-30 NOTE — ACP (Advance Care Planning) (Signed)
Advance Care Planning (ACP) Provider Note - Comprehensive     Date of ACP Conversation: 05/30/17  Persons included in Conversation:  patient  Length of ACP Conversation in minutes:  16 minutes    Authorized Decision Maker (if patient is incapable of making informed decisions):   This person is:  Rodney Arnold, aunt and Rodney Arnold, spouse.            General ACP for ALL Patients with Decision Making Capacity:   Understanding of the healthcare agent role was assessed and information provided  Exploration of values, goals, and preferences if recovery is not expected, even with continued medical treatment in the event of: Imminent death  Severe, permanent brain injury  "In these circumstances, what matters most to you?"  Care focused more on comfort or quality of life.  "What, if any, treatments would you want to avoid?" tubefeeding, IV fluids, CPR, ventilator/respirator, kidney dialysis or antibiotics.    Review of Existing Advance Directive:  What is your understanding of your agent's willingness to honor your wishes, even if he/she may not agree with them? Will follow patient's wishes/  Does this advance directive still reflect your preferences?  Yes (Provide new form/Refer for assistance in updating)    For Serious or Chronic Illness:  Understanding of medical condition      Interventions Provided:  Reviewed existing Advance Directive   Recommended communicating the plan and making copies for the healthcare agent, personal physician, and others as appropriate (e.g., health system)

## 2017-05-30 NOTE — Progress Notes (Signed)
This note will not be viewable in MyChart.          Rodney Arnold is a 32 y.o. year old adult who presents for Fall.    Rodney Arnold is a  32 y.o. adult presents for visit.    Chief Complaint   Patient presents with   ??? Hospital Follow Up     Fall from balcony 40 feet   ??? Back Pain   ??? Hip Pain     bilateral   ??? Leg Pain     bilateral       Hospital Follow Up  Rodney Arnold is seen for follow up from recent admission to [facility] on 05/24/2017 through 05/26/2017.  We reviewed the Patient discharge summary. She presented with traumatic 40 foot fall.  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  She is scheduled to follow-up with neurology.  No changes in medications.  Pain medication was not prescribed at discharge. She reports symptoms are improving. Taking Tylenol prn.  Patient was evaluated by [facility] physical therapy on Friday, May 10.  Since patient is not homebound physical therapy recommended outpatient PT to help with balance and strength training in addition to diminishing pain.    Fall   The history is provided by the patient. The accident occurred more than 2 days ago (05/24/17.). The fall occurred from window/balcony. She fell from a height of 21 - 50 ft (40 feet.). She landed on dirt. The point of impact was the left hip, right hip and neck (back). The pain is present in the neck, left hip and right hip (back). The pain is severe. She was not ambulatory at the scene. There was entrapment (under a branch and ladder) after the fall. There was no drug use involved in the accident. There was no alcohol use involved in the accident. Associated symptoms include numbness, vomiting (1 time in the ambulance), extremity weakness, loss of consciousness, tingling and laceration (legs). Pertinent negatives include no visual change, no fever, no abdominal pain, no bowel incontinence, no nausea, no hematuria, no headaches and no hearing loss. The risk factors  include recurrent falls.  The symptoms are aggravated by ambulation and activity. Treatment on scene includes a c-collar, a backboard and IV fluid (attempted to use C-collar unsuccessfully -taken to [facility] and admitted for 2 days,). She has tried acetaminophen and ice for the symptoms.             Review of Systems   Constitutional: Negative for fever.   Gastrointestinal: Positive for vomiting (1 time in the ambulance). Negative for abdominal pain, bowel incontinence and nausea.   Genitourinary: Negative for hematuria.   Musculoskeletal: Positive for extremity weakness.   Neurological: Positive for tingling, loss of consciousness and numbness. Negative for headaches.        Past Medical History:   Diagnosis Date   ??? Asthma due to environmental allergies    ??? Bipolar 1 disorder (HCC)    ??? Cat allergies    ??? Cerebral palsy (HCC)    ??? MS (multiple sclerosis) (HCC)       Past Surgical History:   Procedure Laterality Date   ??? HX ORTHOPAEDIC  1998    quadricep lengthening surgery        Social History     Tobacco Use   ??? Smoking status: Current Every Day Smoker     Packs/day: 1.00     Years: 11.00     Pack years: 11.00   ???  Smokeless tobacco: Never Used   Substance Use Topics   ??? Alcohol use: Yes     Comment: very rarely      Social History     Social History Narrative   ??? Not on file     Family History   Problem Relation Age of Onset   ??? Psychiatric Disorder Mother    ??? Breast Cancer Mother    ??? Alcohol abuse Mother    ??? Hypertension Mother    ??? Elevated Lipids Mother    ??? Arthritis-rheumatoid Mother    ??? Colon Cancer Father    ??? Alcohol abuse Father    ??? Hypertension Father    ??? Diabetes Father    ??? Heart Failure Father    ??? Psychiatric Disorder Father    ??? Heart Disease Father    ??? Coronary Artery Disease Father    ??? Arthritis-osteo Father    ??? Prostate Cancer Paternal Uncle       Prior to Admission medications    Medication Sig Start Date End Date Taking? Authorizing Provider    acetaminophen (TYLENOL) 500 mg tablet Take 2 Tabs by mouth every eight (8) hours as needed for Pain. Indications: Pain 05/30/17  Yes Rafael Salway, Camillia Herter, NP   FLUoxetine (PROZAC) 20 mg capsule Take 20 mg by mouth daily.   Yes Provider, Historical   estradiol (ESTRACE) 2 mg tablet Take 2 mg by mouth daily.   Yes Provider, Historical   spironolactone (ALDACTONE) 25 mg tablet Take 100 mg by mouth two (2) times a day.   Yes Provider, Historical   clonazePAM (KLONOPIN) 1 mg tablet Take 1 mg by mouth every twelve (12) hours.   Yes Provider, Historical   citalopram (CELEXA) 20 mg tablet Take 20 mg by mouth nightly.   Yes Provider, Historical   doxycycline (MONODOX) 100 mg capsule Take 100 mg by mouth two (2) times a day.    Provider, Historical   gabapentin (NEURONTIN) 100 mg capsule Take  by mouth three (3) times daily.    Provider, Historical   cyclobenzaprine (FLEXERIL) 10 mg tablet Take 2 Tabs by mouth three (3) times daily. 03/30/17   Ronnie Derby, MD      Allergies   Allergen Reactions   ??? Codeine Anaphylaxis   ??? Penicillins Anaphylaxis   ??? Toradol [Ketorolac] Anaphylaxis   ??? Grape Swelling   ??? Peanut Swelling   ??? Pineapple Swelling   ??? Tramadol Rash and Swelling          Visit Vitals  BP 110/72 (BP 1 Location: Right arm, BP Patient Position: Sitting)   Pulse 86   Temp 98.3 ??F (36.8 ??C) (Oral)   Resp 16   Ht  (1.676 m)   Wt 106 lb (48.1 kg)   SpO2 100%   BMI 17.11 kg/m??     Physical Exam   Constitutional: She is oriented to person, place, and time. No distress.   thin   HENT:   Head: Normocephalic and atraumatic.   Right Ear: External ear normal.   Left Ear: External ear normal.   Eyes: Conjunctivae are normal.   Cardiovascular: Normal rate, regular rhythm, normal heart sounds and intact distal pulses.   Pulmonary/Chest: Effort normal and breath sounds normal. She has no wheezes.   Musculoskeletal: She exhibits no edema.        Right hip: She exhibits decreased strength.         Right ankle: She exhibits decreased range of motion.  Unable to dorsi flex-BL -baseline. Right foot TTP. No edema or bruising noted.   Neurological: She is alert and oriented to person, place, and time. She exhibits abnormal muscle tone. Gait abnormal.   Reflex Scores:       Bicep reflexes are 2+ on the right side and 2+ on the left side.       Patellar reflexes are 0 on the right side and 0 on the left side.  Using rollater seat as wheelchair. Unable to bear weight on right lower extremity.   Skin: Skin is warm and dry. Laceration (legs) noted.   Multiple abrasions noted to BLE.   Psychiatric: She has a normal mood and affect. Her behavior is normal.   Nursing note and vitals reviewed.          ASSESSMENT AND PLAN:  Patient Active Problem List    Diagnosis Date Noted   ??? Recurrent falls while walking 04/06/2017   ??? Gait abnormality 03/28/2017   ??? Lower extremity weakness 03/25/2017   ??? Back pain 03/24/2017   ??? Bipolar disorder in remission (HCC) 11/01/2016   ??? 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 Family history of colon cancer in father 10/15/2016   ??? FH: breast cancer in relative when <29 years old 10/15/2016       ICD-10-CM ICD-9-CM   1. Recurrent falls R29.6 V15.88   2. Lumbar pain M54.5 724.2   3. Pain of both hip joints M25.551 719.45    M25.552    4. Spastic diplegic cerebral palsy (HCC) G80.1 343.0   5. Advanced care planning/counseling discussion Z71.89 V65.49     Orders Placed This Encounter   ??? REFERRAL TO PHYSICAL THERAPY     Referral Priority:   Routine     Referral Type:   PT/OT/ST     Referral Reason:   Specialty Services Required     Referred to Provider:   Trinda Pascal, PT, DPT     Requested Specialty:   Physical Therapy     Number of Visits Requested:   1   ??? acetaminophen (TYLENOL) 500 mg tablet     Sig: Take 2 Tabs by mouth every eight (8) hours as needed for Pain. Indications: Pain     Dispense:  90 Tab     Refill:  1       Diagnoses and all orders for this visit:     1. Recurrent falls  -     REFERRAL TO PHYSICAL THERAPY        -     Ambulates with crutches.  Fall precautions    2. Lumbar pain  -     acetaminophen (TYLENOL) 500 mg tablet; Take 2 Tabs by mouth every eight (8) hours as needed for Pain. Indications: Pain  -     REFERRAL TO PHYSICAL THERAPY    3. Pain of both hip joints  -     acetaminophen (TYLENOL) 500 mg tablet; Take 2 Tabs by mouth every eight (8) hours as needed for Pain. Indications: Pain  -     REFERRAL TO PHYSICAL THERAPY    4. Spastic diplegic cerebral palsy (HCC)  -     REFERRAL TO PHYSICAL THERAPY        -      Follow-up with neurology.    5. Advanced care planning/counseling discussion       -     See ACP note.  Signed copy scanned under media tab.  Follow-up and Dispositions    ?? Return if symptoms worsen or fail to improve.               Disclaimer:  Advised her to call back or return to office if symptoms worsen/change/persist.  Discussed expected course/resolution/complications of diagnosis in detail with patient.    Medication risks/benefits/costs/interactions/alternatives discussed with patient.  She was given an after visit summary which includes diagnoses, current medications, & vitals.  She expressed understanding with the diagnosis and plan.

## 2017-05-30 NOTE — Progress Notes (Signed)
Chief Complaint   Patient presents with   ??? Hospital Follow Up     Fall from balcony 40 feet   ??? Back Pain   ??? Hip Pain     bilateral   ??? Leg Pain     bilateral     Visit Vitals  BP 110/72 (BP 1 Location: Right arm, BP Patient Position: Sitting)   Pulse 86   Temp 98.3 ??F (36.8 ??C) (Oral)   Resp 16   Ht  (1.676 m)   Wt 106 lb (48.1 kg)   SpO2 100%   BMI 17.11 kg/m??     1. Have you been to the ER, urgent care clinic since your last visit?  Hospitalized since your last visit?05/24/17 ER [facility]    2. Have you seen or consulted any other health care providers outside of the [facility] since your last visit?  Include any pap smears or colon screening.NO

## 2017-05-30 NOTE — ACP (Advance Care Planning) (Signed)
Advance Care Planning (ACP) Provider Note - Comprehensive     Date of ACP Conversation: 05/30/17  Persons included in Conversation:  patient  Length of ACP Conversation in minutes:  16 minutes    Authorized Management consultant (if patient is incapable of making informed decisions):   This person is:  Margit Banda, aunt and [place], spouse.            General ACP for ALL Patients with Decision Making Capacity:   Understanding of the healthcare agent role was assessed and information provided  Exploration of values, goals, and preferences if recovery is not expected, even with continued medical treatment in the event of: Imminent death  Severe, permanent brain injury  "In these circumstances, what matters most to you?"  Care focused more on comfort or quality of life.  "What, if any, treatments would you want to avoid?" tubefeeding, IV fluids, CPR, ventilator/respirator, kidney dialysis or antibiotics.    Review of Existing Advance Directive:  What is your understanding of your agent's willingness to honor your wishes, even if he/she may not agree with them? Will follow patient's wishes/  Does this advance directive still reflect your preferences?  Yes (Provide new form/Refer for assistance in updating)    For Serious or Chronic Illness:  Understanding of medical condition      Interventions Provided:  Reviewed existing Advance Directive   Recommended communicating the plan and making copies for the healthcare agent, personal physician, and others as appropriate (e.g., health system)

## 2017-06-14 NOTE — Telephone Encounter (Signed)
Patient is scheduled with Pain Management in [place]- Dr. Stann Mainland office    She needs the referral faxed over asap.

## 2017-06-14 NOTE — Telephone Encounter (Signed)
Attempted to contact office for fax number to send referral to, and no answer for 10+ minutes, left a voice mail for someone to return call w/ fax number. End of encounter.

## 2017-06-17 ENCOUNTER — Inpatient Hospital Stay
Admit: 2017-06-17 | Discharge: 2017-06-17 | Disposition: A | Discharge status: Home / Self Care | Payer: MEDICARE | Attending: Emergency Medicine

## 2017-06-17 ENCOUNTER — Emergency Department: Admit: 2017-06-17 | Payer: MEDICARE | Primary: Adolescent Medicine

## 2017-06-17 DIAGNOSIS — S0990XA Unspecified injury of head, initial encounter: Secondary | ICD-10-CM

## 2017-06-17 LAB — CBC
Hematocrit: 40.1 % (ref 36.6–50.3)
Hemoglobin: 13 g/dL (ref 12.1–17.0)
MCH: 29.5 PG (ref 26.0–34.0)
MCHC: 32.4 g/dL (ref 30.0–36.5)
MCV: 90.9 FL (ref 80.0–99.0)
MPV: 10 FL (ref 8.9–12.9)
NRBC Absolute: 0 10*3/uL (ref 0.00–0.01)
Nucleated RBCs: 0 PER 100 WBC
Platelets: 188 10*3/uL (ref 150–400)
RBC: 4.41 M/uL (ref 4.10–5.70)
RDW: 13.7 % (ref 11.5–14.5)
WBC: 5.4 10*3/uL (ref 4.1–11.1)

## 2017-06-17 LAB — BASIC METABOLIC PANEL
Anion Gap: 4 mmol/L — ABNORMAL LOW (ref 5–15)
BUN: 13 MG/DL (ref 6–20)
Bun/Cre Ratio: 17 (ref 12–20)
CO2: 30 mmol/L (ref 21–32)
Calcium: 8.8 MG/DL (ref 8.5–10.1)
Chloride: 109 mmol/L — ABNORMAL HIGH (ref 97–108)
Creatinine: 0.77 MG/DL (ref 0.70–1.30)
EGFR IF NonAfrican American: >60 mL/min/{1.73_m2} (ref >60)
GFR African American: >60 mL/min/{1.73_m2} (ref >60)
Glucose: 98 mg/dL (ref 65–100)
Potassium: 3.7 mmol/L (ref 3.5–5.1)
Sodium: 143 mmol/L (ref 136–145)

## 2017-06-17 LAB — SAMPLES BEING HELD

## 2017-06-17 LAB — CBC W/O DIFF
ABSOLUTE NRBC: 0 10*3/uL (ref 0.00–0.01)
HCT: 40.1 % (ref 36.6–50.3)
HGB: 13 g/dL (ref 12.1–17.0)
MCH: 29.5 PG (ref 26.0–34.0)
MCHC: 32.4 g/dL (ref 30.0–36.5)
MCV: 90.9 FL (ref 80.0–99.0)
MPV: 10 FL (ref 8.9–12.9)
NRBC: 0 PER 100 WBC
PLATELET: 188 10*3/uL (ref 150–400)
RBC: 4.41 M/uL (ref 4.10–5.70)
RDW: 13.7 % (ref 11.5–14.5)
WBC: 5.4 10*3/uL (ref 4.1–11.1)

## 2017-06-17 LAB — METABOLIC PANEL, BASIC
Anion gap: 4 mmol/L — ABNORMAL LOW (ref 5–15)
BUN/Creatinine ratio: 17 (ref 12–20)
BUN: 13 MG/DL (ref 6–20)
CO2: 30 mmol/L (ref 21–32)
Calcium: 8.8 MG/DL (ref 8.5–10.1)
Chloride: 109 mmol/L — ABNORMAL HIGH (ref 97–108)
Creatinine: 0.77 MG/DL (ref 0.70–1.30)
GFR est AA: >60 mL/min/{1.73_m2} (ref >60)
GFR est non-AA: >60 mL/min/{1.73_m2} (ref >60)
Glucose: 98 mg/dL (ref 65–100)
Potassium: 3.7 mmol/L (ref 3.5–5.1)
Sodium: 143 mmol/L (ref 136–145)

## 2017-06-17 MED ORDER — LORAZEPAM 2 MG/ML IJ SOLN
2 mg/mL | INTRAMUSCULAR | Status: AC
Start: 2017-06-17 — End: 2017-06-17
  Administered 2017-06-17: 20:00:00 via INTRAVENOUS

## 2017-06-17 MED ORDER — LORAZEPAM 2 MG/ML IJ SOLN
2 mg/mL | INTRAMUSCULAR | Status: AC
Start: 2017-06-17 — End: 2017-06-17

## 2017-06-17 MED FILL — LORAZEPAM 2 MG/ML IJ SOLN: 2 mg/mL | INTRAMUSCULAR | Qty: 2

## 2017-06-17 NOTE — ED Notes (Signed)
Pt is ambulatory and AOx4. Pt escorted to lobby by RN and will wait for Medicaid transportation.

## 2017-06-17 NOTE — ED Notes (Addendum)
Discharge instructions given to patient  and nurse. Pt stated he "doesn't need to review the paperwork because I can read". IV D/C. Pt ambulated off of unit in no signs of distress denies any pain.

## 2017-06-17 NOTE — ED Notes (Signed)
Pt states his "preferred pronoun is 'she' and my transgender name is [name]"". Staff notiified.

## 2017-06-17 NOTE — ED Notes (Signed)
Pt is now AOx4 no post ictal state noted. Provider notified. RN will  Monitor pt closely.

## 2017-06-17 NOTE — ED Notes (Signed)
RN went to d/c pt, pt stated he "has no money and doesn't get paid til Friday so I have no ride home, no family that drives and no friends that drive". Pt is upset that we don't arrange [employer] for him stating "like all the other hospitals do".  RN notified case management. Case management will arrange transportation via Medicare.

## 2017-06-17 NOTE — ED Notes (Signed)
Another  Episode of seizure-like activity was observed by this RN, Dr Mason notified, RN left room to tell staff and upon return, no seizure activity noted and no post ictal s/s noted.  Will monitor pt closely per Dr Mason, no new orders received.

## 2017-06-17 NOTE — ED Provider Notes (Signed)
32 y.o. adult male with past medical history significant for cerebral palsy, asthma, MS, bipolar 1 disorder, who presents from EMS with chief complaint of seizure. Pt started seizing after experiencing a mechanical glf down the steps of his home. His sister called EMS; no medications taken PTA. S/p arrival to ED, pt denies etoh and substance abuse. There are no other acute medical concerns at this time.    PCP: Lorri Frederick, NP    Chart Review: Pt evaluated in ED on 05/18/17 for seizure that began after glf down steps of home. Pt left [facility] after being denied narcotics. Workup at [facility] ED is not consistence for a seizure. Kepra not given. Pt left after being denied narcotics.    Note written by Pearson Grippe, Scribe, as dictated by Domingo Cocking, MD 4:04 PM     The history is provided by the patient and the EMS personnel. No language interpreter was used.        Past Medical History:   Diagnosis Date   ??? Asthma due to environmental allergies    ??? Bipolar 1 disorder (HCC)    ??? Cat allergies    ??? Cerebral palsy (HCC)    ??? MS (multiple sclerosis) (HCC)        Past Surgical History:   Procedure Laterality Date   ??? HX ORTHOPAEDIC  1998    quadricep lengthening surgery         Family History:   Problem Relation Age of Onset   ??? Psychiatric Disorder Mother    ??? Breast Cancer Mother    ??? Alcohol abuse Mother    ??? Hypertension Mother    ??? Elevated Lipids Mother    ??? Arthritis-rheumatoid Mother    ??? Colon Cancer Father    ??? Alcohol abuse Father    ??? Hypertension Father    ??? Diabetes Father    ??? Heart Failure Father    ??? Psychiatric Disorder Father    ??? Heart Disease Father    ??? Coronary Artery Disease Father    ??? Arthritis-osteo Father    ??? Prostate Cancer Paternal Uncle        Social History     Socioeconomic History   ??? Marital status: MARRIED     Spouse name: Not on file   ??? Number of children: Not on file   ??? Years of education: Not on file   ??? Highest education level: Not on file   Occupational History    ??? Not on file   Social Needs   ??? Financial resource strain: Not on file   ??? Food insecurity:     Worry: Not on file     Inability: Not on file   ??? Transportation needs:     Medical: Not on file     Non-medical: Not on file   Tobacco Use   ??? Smoking status: Current Every Day Smoker     Packs/day: 1.00     Years: 11.00     Pack years: 11.00   ??? Smokeless tobacco: Never Used   Substance and Sexual Activity   ??? Alcohol use: Yes     Comment: very rarely   ??? Drug use: No   ??? Sexual activity: Not Currently   Lifestyle   ??? Physical activity:     Days per week: Not on file     Minutes per session: Not on file   ??? Stress: Not on file   Relationships   ???  Social connections:     Talks on phone: Not on file     Gets together: Not on file     Attends religious service: Not on file     Active member of club or organization: Not on file     Attends meetings of clubs or organizations: Not on file     Relationship status: Not on file   ??? Intimate partner violence:     Fear of current or ex partner: Not on file     Emotionally abused: Not on file     Physically abused: Not on file     Forced sexual activity: Not on file   Other Topics Concern   ??? Not on file   Social History Narrative   ??? Not on file         ALLERGIES: Codeine; Penicillins; Toradol [ketorolac]; Grape; Peanut; Pineapple; and Tramadol    Review of Systems   Constitutional: Negative for fever.   HENT: Negative for facial swelling.    Eyes: Negative for visual disturbance.   Respiratory: Negative for chest tightness.    Cardiovascular: Negative for chest pain.   Gastrointestinal: Negative for abdominal pain.   Genitourinary: Negative for difficulty urinating and dysuria.   Musculoskeletal: Negative for arthralgias.   Skin: Negative for rash.   Neurological: Positive for seizures. Negative for dizziness.   Hematological: Negative for adenopathy.   Psychiatric/Behavioral: Negative for suicidal ideas.       Vitals:    06/17/17 1553   Pulse: 88   Temp: 99 ??F (37.2 ??C)             Physical Exam   Constitutional: She appears well-developed and well-nourished.   Pt appears to be holding his body rigid and not responds to questions.  He resists opening eye lids.   HENT:   Head: Normocephalic and atraumatic.   Mouth/Throat: Oropharynx is clear and moist.   Eyes: Pupils are equal, round, and reactive to light.   Neck: No tracheal deviation present.   In c-collar   Cardiovascular: Normal rate, regular rhythm, normal heart sounds and intact distal pulses.   Pulmonary/Chest: Effort normal and breath sounds normal. No respiratory distress.   Abdominal: Soft. Bowel sounds are normal. She exhibits no distension. There is no tenderness.   Musculoskeletal: She exhibits no deformity.   Neurological:   Generalized clonus.  Not responding to questions.   Skin: Skin is warm and dry.   Nursing note and vitals reviewed.       MDM  Number of Diagnoses or Management Options  Diagnosis management comments: A:  Pt arrives with generalized clonus following fall down stairs.  No h/o seizures but per EMS, has had similar episodes in past.  On chart review, pt seen here on 5/1 after similar episode and first leaving [facility] AMA then walking out of ED at [facility] when not given opioid pain medication.  Diagnosed with drug seeking behavior.      Pt arrived here with possible seizure activity.  HR and Pulse ox remained normal.  Given Ativan 2mg  IV with immediate cessation of seizures well before the medication could have taken affect.  Pt immediately awake and alert answering questions and complaining of severe low back pain.  VS remain stable.  10 minutes after arrival, pt had another episode of generalized clonus during which he maintained normal VS.  This stopped after a few minutes without intervention.  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  Will proceed with possible seizure/trauma work up.       ED EKG interpretation:   Rhythm: normal sinus rhythm. Rate (approx.): 86.  Axis: normal.  ST segment:  No concerning ST elevations or depressions. This EKG was interpreted by Domingo CockingJeffrey T Patrice Matthew, MD,ED Provider.    Labs unremarkable - not consistent with true seizure    CT head/c-spine/l-spine unremarkable for traumatic injury.      Procedures

## 2017-06-17 NOTE — ED Triage Notes (Signed)
Pt arrives actively seizing. EMS reports that pt was found at bottom of stairwell after falling  down flight of 15 stairs. Pt arrives in C-spine precautions, unresponsive.

## 2017-06-17 NOTE — ED Notes (Signed)
Pt is now AOx4, no post-ictal activity noted. Provider notified.

## 2017-06-17 NOTE — ED Notes (Signed)
Pt is ambulatory and AOx4. Pt escorted to lobby by RN and will wait for Medicaid transportation.

## 2017-06-17 NOTE — ED Notes (Signed)
 Pt states his preferred pronoun is 'she' and my transgender name is [name]. Staff notiified.

## 2017-06-17 NOTE — ED Provider Notes (Signed)
32 y.o. adult male with past medical history significant for cerebral palsy, asthma, MS, bipolar 1 disorder, who presents from EMS with chief complaint of seizure. Pt started seizing after experiencing a mechanical glf down the steps of his home. His sister called EMS; no medications taken PTA. S/p arrival to ED, pt denies etoh and substance abuse. There are no other acute medical concerns at this time.    PCP: Lorri Frederick, NP    Chart Review: Pt evaluated in ED on 05/18/17 for seizure that began after glf down steps of home. Pt left [facility] after being denied narcotics. Workup at [facility] ED is not consistence for a seizure. Kepra not given. Pt left after being denied narcotics.    Note written by Pearson Grippe, Scribe, as dictated by Domingo Cocking, MD 4:04 PM     The history is provided by the patient and the EMS personnel. No language interpreter was used.        Past Medical History:   Diagnosis Date   ??? Asthma due to environmental allergies    ??? Bipolar 1 disorder (HCC)    ??? Cat allergies    ??? Cerebral palsy (HCC)    ??? MS (multiple sclerosis) (HCC)        Past Surgical History:   Procedure Laterality Date   ??? HX ORTHOPAEDIC  1998    quadricep lengthening surgery         Family History:   Problem Relation Age of Onset   ??? Psychiatric Disorder Mother    ??? Breast Cancer Mother    ??? Alcohol abuse Mother    ??? Hypertension Mother    ??? Elevated Lipids Mother    ??? Arthritis-rheumatoid Mother    ??? Colon Cancer Father    ??? Alcohol abuse Father    ??? Hypertension Father    ??? Diabetes Father    ??? Heart Failure Father    ??? Psychiatric Disorder Father    ??? Heart Disease Father    ??? Coronary Artery Disease Father    ??? Arthritis-osteo Father    ??? Prostate Cancer Paternal Uncle        Social History     Socioeconomic History   ??? Marital status: MARRIED     Spouse name: Not on file   ??? Number of children: Not on file   ??? Years of education: Not on file   ??? Highest education level: Not on file   Occupational History   ??? Not on file    Social Needs   ??? Financial resource strain: Not on file   ??? Food insecurity:     Worry: Not on file     Inability: Not on file   ??? Transportation needs:     Medical: Not on file     Non-medical: Not on file   Tobacco Use   ??? Smoking status: Current Every Day Smoker     Packs/day: 1.00     Years: 11.00     Pack years: 11.00   ??? Smokeless tobacco: Never Used   Substance and Sexual Activity   ??? Alcohol use: Yes     Comment: very rarely   ??? Drug use: No   ??? Sexual activity: Not Currently   Lifestyle   ??? Physical activity:     Days per week: Not on file     Minutes per session: Not on file   ??? Stress: Not on file   Relationships   ???  Social connections:     Talks on phone: Not on file     Gets together: Not on file     Attends religious service: Not on file     Active member of club or organization: Not on file     Attends meetings of clubs or organizations: Not on file     Relationship status: Not on file   ??? Intimate partner violence:     Fear of current or ex partner: Not on file     Emotionally abused: Not on file     Physically abused: Not on file     Forced sexual activity: Not on file   Other Topics Concern   ??? Not on file   Social History Narrative   ??? Not on file         ALLERGIES: Codeine; Penicillins; Toradol [ketorolac]; Grape; Peanut; Pineapple; and Tramadol    Review of Systems   Constitutional: Negative for fever.   HENT: Negative for facial swelling.    Eyes: Negative for visual disturbance.   Respiratory: Negative for chest tightness.    Cardiovascular: Negative for chest pain.   Gastrointestinal: Negative for abdominal pain.   Genitourinary: Negative for difficulty urinating and dysuria.   Musculoskeletal: Negative for arthralgias.   Skin: Negative for rash.   Neurological: Positive for seizures. Negative for dizziness.   Hematological: Negative for adenopathy.   Psychiatric/Behavioral: Negative for suicidal ideas.       Vitals:    06/17/17 1553   Pulse: 88   Temp: 99 ??F (37.2 ??C)            Physical  Exam   Constitutional: She appears well-developed and well-nourished.   Pt appears to be holding his body rigid and not responds to questions.  He resists opening eye lids.   HENT:   Head: Normocephalic and atraumatic.   Mouth/Throat: Oropharynx is clear and moist.   Eyes: Pupils are equal, round, and reactive to light.   Neck: No tracheal deviation present.   In c-collar   Cardiovascular: Normal rate, regular rhythm, normal heart sounds and intact distal pulses.   Pulmonary/Chest: Effort normal and breath sounds normal. No respiratory distress.   Abdominal: Soft. Bowel sounds are normal. She exhibits no distension. There is no tenderness.   Musculoskeletal: She exhibits no deformity.   Neurological:   Generalized clonus.  Not responding to questions.   Skin: Skin is warm and dry.   Nursing note and vitals reviewed.       MDM  Number of Diagnoses or Management Options  Diagnosis management comments: A:  Pt arrives with generalized clonus following fall down stairs.  No h/o seizures but per EMS, has had similar episodes in past.  On chart review, pt seen here on 5/1 after similar episode and first leaving [facility] AMA then walking out of ED at [facility] when not given opioid pain medication.  Diagnosed with drug seeking behavior.      Pt arrived here with possible seizure activity.  HR and Pulse ox remained normal.  Given Ativan 2mg  IV with immediate cessation of seizures well before the medication could have taken affect.  Pt immediately awake and alert answering questions and complaining of severe low back pain.  VS remain stable.  10 minutes after arrival, pt had another episode of generalized clonus during which he maintained normal VS.  This stopped after a few minutes without intervention.  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  Will proceed with possible seizure/trauma work up.       ED EKG interpretation:  Rhythm: normal sinus rhythm. Rate (approx.): 86.  Axis:  normal.  ST segment:  No concerning ST elevations or depressions. This EKG was interpreted by Domingo CockingJeffrey T Naman Spychalski, MD,ED Provider.    Labs unremarkable - not consistent with true seizure    CT head/c-spine/l-spine unremarkable for traumatic injury.      Procedures

## 2017-06-17 NOTE — ED Notes (Signed)
Pt arrives actively seizing. EMS reports that pt was found at bottom of stairwell after falling  down flight of 15 stairs. Pt arrives in C-spine precautions, unresponsive.

## 2017-06-17 NOTE — ED Notes (Signed)
 RN went to d/c pt, pt stated he has no money and doesn't get paid til Friday so I have no ride home, no family that drives and no friends that drive. Pt is upset that we don't arrange [place] for him stating like all the other hospitals do.  RN notified case management. Case management will arrange transportation via Medicare.

## 2017-06-17 NOTE — ED Notes (Signed)
Pt is now AOx4 no post ictal state noted. Provider notified. RN will  Monitor pt closely.

## 2017-06-17 NOTE — Progress Notes (Signed)
CM notified of needed transportation assistance. CM verified pt will be discharging to address on facesheet. CM set up Medicaid transport via [employer]. [employer] currently requesting authorization.     Elnora MorrisonJesseca White RN, BSN  Care Management Department    [phone]: [employer] received authorization. Authorization number #: [id number]. [employer] #: [id number] [phone]. ETA: [id number]

## 2017-06-17 NOTE — ED Notes (Signed)
 Discharge instructions given to patient  and nurse. Pt stated he doesn't need to review the paperwork because I can read. IV D/C. Pt ambulated off of unit in no signs of distress denies any pain.

## 2017-06-17 NOTE — ED Notes (Signed)
Another  Episode of seizure-like activity was observed by this RN, Dr Marlene Bast notified, RN left room to tell staff and upon return, no seizure activity noted and no post ictal s/s noted.  Will monitor pt closely per Dr Marlene Bast, no new orders received.

## 2017-06-17 NOTE — ED Notes (Signed)
Pt is now AOx4, no post-ictal activity noted. Provider notified.

## 2017-06-17 NOTE — Progress Notes (Addendum)
CM notified of needed transportation assistance. CM verified pt will be discharging to address on facesheet. CM set up Medicaid transport via [employer]. [employer] currently requesting authorization.     Jesseca White RN, BSN  Care Management Department    1940: [employer] received authorization. Authorization number #: [id number]. [employer] #: [id number]. ETA: [id number]

## 2017-06-19 LAB — EKG 12-LEAD
Atrial Rate: 86 {beats}/min
Diagnosis: NORMAL
P Axis: 28 degrees
P-R Interval: 138 ms
Q-T Interval: 364 ms
QRS Duration: 94 ms
QTc Calculation (Bazett): 435 ms
R Axis: 71 degrees
T Axis: 55 degrees
Ventricular Rate: 86 {beats}/min

## 2017-06-19 LAB — EKG, 12 LEAD, INITIAL
Atrial Rate: 86 {beats}/min
Calculated P Axis: 28 degrees
Calculated R Axis: 71 degrees
Calculated T Axis: 55 degrees
Diagnosis: NORMAL
P-R Interval: 138 ms
Q-T Interval: 364 ms
QRS Duration: 94 ms
QTC Calculation (Bezet): 435 ms
Ventricular Rate: 86 {beats}/min

## 2017-06-22 ENCOUNTER — Encounter: Attending: Family | Primary: Adolescent Medicine

## 2017-06-23 ENCOUNTER — Encounter: Attending: Family | Primary: Adolescent Medicine

## 2017-06-28 NOTE — Telephone Encounter (Signed)
Pt went to see pain management doctor and they refused to see patient, unsure what to do now and would like a call back
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
## 2017-07-01 NOTE — Telephone Encounter (Signed)
Return patient's call.  I advised patient to contact [facility] pain management as he is an established patient with [facility] and they have all his imaging studies and ED notes.  Provided the telephone [phone]??? [phone].  Patient agrees to follow-up and schedule an appointment with Dr. Lindwood QuaAndrew Chapman.  They have an office [place] at the [facility].

## 2017-08-17 ENCOUNTER — Encounter

## 2017-08-17 MED ORDER — ACETAMINOPHEN 500 MG TAB
500 mg | ORAL_TABLET | Freq: Three times a day (TID) | ORAL | 1 refills | Status: AC | PRN
Start: 2017-08-17 — End: ?

## 2017-08-17 NOTE — Telephone Encounter (Signed)
Requesting refill be done today- fell recently and has pain in  hip

## 2017-08-17 NOTE — Telephone Encounter (Signed)
LOV: 05/30/2017  Refill: 05/30/2017  Patient would like refilled today, due to recent fall.

## 2017-12-13 ENCOUNTER — Encounter: Attending: Adolescent Medicine | Primary: Adolescent Medicine

## 2017-12-14 ENCOUNTER — Encounter: Attending: Adolescent Medicine | Primary: Adolescent Medicine

## 2017-12-19 ENCOUNTER — Encounter: Attending: Adolescent Medicine | Primary: Adolescent Medicine

## 2018-01-04 ENCOUNTER — Encounter: Attending: Adolescent Medicine | Primary: Adolescent Medicine

## 2018-01-17 ENCOUNTER — Ambulatory Visit: Attending: Adolescent Medicine | Primary: Adolescent Medicine

## 2018-01-17 DIAGNOSIS — M84351A Stress fracture, right femur, initial encounter for fracture: Secondary | ICD-10-CM

## 2018-01-17 MED ORDER — LINACLOTIDE 145 MCG CAPSULE
145 mcg | ORAL_CAPSULE | Freq: Every day | ORAL | 0 refills | Status: AC
Start: 2018-01-17 — End: ?

## 2018-01-17 MED ORDER — ALBUTEROL SULFATE HFA 90 MCG/ACTUATION AEROSOL INHALER
90 mcg/actuation | RESPIRATORY_TRACT | 0 refills | Status: DC | PRN
Start: 2018-01-17 — End: 2018-02-14
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
## 2018-01-21 LAB — CBC WITH AUTO DIFFERENTIAL
Basophils %: 1 %
Basophils Absolute: 0.1 10*3/uL (ref 0.0–0.2)
Eosinophils %: 2 %
Eosinophils Absolute: 0.1 10*3/uL (ref 0.0–0.4)
Granulocyte Absolute Count: 0 10*3/uL (ref 0.0–0.1)
Hematocrit: 41.9 % (ref 37.5–51.0)
Hemoglobin: 14.2 g/dL (ref 13.0–17.7)
Immature Granulocytes: 0 %
Lymphocytes %: 29 %
Lymphocytes Absolute: 1.6 10*3/uL (ref 0.7–3.1)
MCH: 29.7 pg (ref 26.6–33.0)
MCHC: 33.9 g/dL (ref 31.5–35.7)
MCV: 88 fL (ref 79–97)
Monocytes %: 8 %
Monocytes Absolute: 0.5 10*3/uL (ref 0.1–0.9)
Neutrophils %: 60 %
Neutrophils Absolute: 3.2 10*3/uL (ref 1.4–7.0)
Platelets: 200 10*3/uL (ref 150–450)
RBC: 4.78 x10E6/uL (ref 4.14–5.80)
RDW: 12 % — ABNORMAL LOW (ref 12.3–15.4)
WBC: 5.4 10*3/uL (ref 3.4–10.8)

## 2018-01-21 LAB — URINALYSIS W/ RFLX MICROSCOPIC
Bilirubin, Urine: NEGATIVE
Bilirubin: NEGATIVE
Blood, Urine: NEGATIVE
Blood: NEGATIVE
Glucose, UA: NEGATIVE
Glucose: NEGATIVE
Ketone: NEGATIVE
Ketones, Urine: NEGATIVE
Leukocyte Esterase, Urine: NEGATIVE
Leukocyte Esterase: NEGATIVE
Nitrite, Urine: NEGATIVE
Nitrites: NEGATIVE
Specific Gravity, UA: >=1.03 — AB (ref 1.005–1.030)
Specific Gravity: >=1.03 — AB (ref 1.005–1.030)
Urobilinogen, Urine: 1 mg/dL (ref 0.2–1.0)
Urobilinogen: 1 mg/dL (ref 0.2–1.0)
pH (UA): 6.5 (ref 5.0–7.5)
pH, UA: 6.5 NA (ref 5.0–7.5)

## 2018-01-21 LAB — COMPREHENSIVE METABOLIC PANEL
ALT: 8 IU/L (ref 0–44)
AST: 11 IU/L (ref 0–40)
Albumin/Globulin Ratio: 1.9 NA (ref 1.2–2.2)
Albumin: 4.3 g/dL (ref 3.5–5.5)
Alkaline Phosphatase: 62 IU/L (ref 39–117)
BUN: 12 mg/dL (ref 6–20)
Bun/Cre Ratio: 15 NA (ref 9–20)
CO2: 21 mmol/L (ref 20–29)
Calcium: 9 mg/dL (ref 8.7–10.2)
Chloride: 104 mmol/L (ref 96–106)
Creatinine: 0.82 mg/dL (ref 0.76–1.27)
EGFR IF NonAfrican American: 117 mL/min/{1.73_m2} (ref >59)
GFR African American: 135 mL/min/{1.73_m2} (ref >59)
Globulin, Total: 2.3 g/dL (ref 1.5–4.5)
Glucose: 87 mg/dL (ref 65–99)
Potassium: 4.5 mmol/L (ref 3.5–5.2)
Sodium: 142 mmol/L (ref 134–144)
Total Bilirubin: 0.3 mg/dL (ref 0.0–1.2)
Total Protein: 6.6 g/dL (ref 6.0–8.5)

## 2018-01-21 LAB — 11-DRUG SCREEN, URINE W RFLX CONFIRM
AMPHETAMINES, URINE: NEGATIVE ng/mL
Amphetamines, urine: NEGATIVE ng/mL
BARBITURATE, 714899: NEGATIVE ng/mL
BENZODIAZEPINES, 714832: NEGATIVE ng/mL
Barbiturates: NEGATIVE ng/mL
Benzodiazepines: NEGATIVE ng/mL
CANNABINOID, 716019: NEGATIVE ng/mL
COCAINE (METAB.), 714857: NEGATIVE ng/mL
Cannabinoids: NEGATIVE ng/mL
Cocaine: NEGATIVE ng/mL
MDMA: NEGATIVE ng/mL
MDMA: NEGATIVE ng/mL
METHADONE SCREEN, URINE, 74468: POSITIVE — AB
METHAQUALONE, 714873: NEGATIVE ng/mL
Methadone Screen, Urine: POSITIVE — AB
Methaqualone: NEGATIVE ng/mL
OPIATES, 714865: NEGATIVE ng/mL
Opiates: NEGATIVE ng/mL
PHENCYCLIDINE, 714881: NEGATIVE ng/mL
PROPOXYPHENE, URINE, 733812: NEGATIVE ng/mL
PROPOXYPHENE, URINE: NEGATIVE ng/mL
Phencyclidine: NEGATIVE ng/mL

## 2018-01-21 LAB — TSH, REFLEX TO T4: TSH: 0.91 u[IU]/mL (ref 0.450–4.500)

## 2018-01-21 LAB — METABOLIC PANEL, COMPREHENSIVE
A-G Ratio: 1.9 (ref 1.2–2.2)
ALT (SGPT): 8 IU/L (ref 0–44)
AST (SGOT): 11 IU/L (ref 0–40)
Albumin: 4.3 g/dL (ref 3.5–5.5)
Alk. phosphatase: 62 IU/L (ref 39–117)
BUN/Creatinine ratio: 15 (ref 9–20)
BUN: 12 mg/dL (ref 6–20)
Bilirubin, total: 0.3 mg/dL (ref 0.0–1.2)
CO2: 21 mmol/L (ref 20–29)
Calcium: 9 mg/dL (ref 8.7–10.2)
Chloride: 104 mmol/L (ref 96–106)
Creatinine: 0.82 mg/dL (ref 0.76–1.27)
GFR est AA: 135 mL/min/{1.73_m2} (ref >59)
GFR est non-AA: 117 mL/min/{1.73_m2} (ref >59)
GLOBULIN, TOTAL: 2.3 g/dL (ref 1.5–4.5)
Glucose: 87 mg/dL (ref 65–99)
Potassium: 4.5 mmol/L (ref 3.5–5.2)
Protein, total: 6.6 g/dL (ref 6.0–8.5)
Sodium: 142 mmol/L (ref 134–144)

## 2018-01-21 LAB — CBC WITH AUTOMATED DIFF
ABS. BASOPHILS: 0.1 10*3/uL (ref 0.0–0.2)
ABS. EOSINOPHILS: 0.1 10*3/uL (ref 0.0–0.4)
ABS. IMM. GRANS.: 0 10*3/uL (ref 0.0–0.1)
ABS. MONOCYTES: 0.5 10*3/uL (ref 0.1–0.9)
ABS. NEUTROPHILS: 3.2 10*3/uL (ref 1.4–7.0)
Abs Lymphocytes: 1.6 10*3/uL (ref 0.7–3.1)
BASOPHILS: 1 %
EOSINOPHILS: 2 %
HCT: 41.9 % (ref 37.5–51.0)
HGB: 14.2 g/dL (ref 13.0–17.7)
IMMATURE GRANULOCYTES: 0 %
Lymphocytes: 29 %
MCH: 29.7 pg (ref 26.6–33.0)
MCHC: 33.9 g/dL (ref 31.5–35.7)
MCV: 88 fL (ref 79–97)
MONOCYTES: 8 %
NEUTROPHILS: 60 %
PLATELET: 200 10*3/uL (ref 150–450)
RBC: 4.78 x10E6/uL (ref 4.14–5.80)
RDW: 12 % — ABNORMAL LOW (ref 12.3–15.4)
WBC: 5.4 10*3/uL (ref 3.4–10.8)

## 2018-01-21 LAB — TSH REFLEX TO T4: TSH: 0.91 u[IU]/mL (ref 0.450–4.500)

## 2018-02-14 ENCOUNTER — Ambulatory Visit
Admit: 2018-02-14 | Discharge: 2018-02-14 | Payer: PRIVATE HEALTH INSURANCE | Attending: Adolescent Medicine | Primary: Adolescent Medicine

## 2018-02-14 ENCOUNTER — Ambulatory Visit: Attending: Adolescent Medicine | Primary: Adolescent Medicine

## 2018-02-14 DIAGNOSIS — Z0000 Encounter for general adult medical examination without abnormal findings: Secondary | ICD-10-CM

## 2018-02-14 MED ORDER — LACTULOSE 10 GRAM/15 ML ORAL SOLN
10 gram/15 mL | ORAL | 0 refills | Status: AC
Start: 2018-02-14 — End: ?

## 2018-02-14 MED ORDER — ALBUTEROL SULFATE HFA 90 MCG/ACTUATION AEROSOL INHALER
90 mcg/actuation | RESPIRATORY_TRACT | 2 refills | Status: AC | PRN
Start: 2018-02-14 — End: ?

## 2018-02-14 NOTE — Patient Instructions (Signed)
Medicare Wellness Visit, Male     The best way to live healthy is to have a lifestyle where you eat a well-balanced diet, exercise regularly, limit alcohol use, and quit all forms of tobacco/nicotine, if applicable.     Regular preventive services are another way to keep healthy. Preventive services (vaccines, screening tests, monitoring & exams) can help personalize your care plan, which helps you manage your own care. Screening tests can find health problems at the earliest stages, when they are easiest to treat.   [facility] follows the current, evidence-based guidelines published by the [place] [employer] (USPSTF) when recommending preventive services for our patients. Because we follow these guidelines, sometimes recommendations change over time as research supports it. (For example, mammograms used to be recommended annually. Even though Medicare will still pay for an annual mammogram, the newer guidelines recommend a mammogram every two years for women of average risk).  Of course, you and your doctor may decide to screen more often for some diseases, based on your risk and your co-morbidities (chronic disease you are already diagnosed with).     Preventive services for you include:  - Medicare offers their members a free annual wellness visit, which is time for you and your primary care provider to discuss and plan for your preventive service needs. Take advantage of this benefit every year!  -All adults over the age of 22 should receive the recommended pneumonia vaccines. Current USPSTF guidelines recommend a series of two vaccines for the best pneumonia protection.   -All adults should have a flu vaccine yearly and a tetanus vaccine every 10 years.   -All adults age 65 and older should receive the shingles vaccines (series of two vaccines).      -All adults age 26-70 who are overweight should have a diabetes screening test once every three years.    -All adults born between [birth year] and 1965 should be screened once for Hepatitis C.  -Other screening tests and preventive services for persons with diabetes include: an eye exam to screen for diabetic retinopathy, a kidney function test, a foot exam, and stricter control over your cholesterol.   -Cardiovascular screening for adults with routine risk involves an electrocardiogram (ECG) at intervals determined by your doctor.   -Colorectal cancer screenings should be done for adults age 50-75 with no increased risk factors for colorectal cancer.  There are a number of acceptable methods of screening for this type of cancer. Each test has its own benefits and drawbacks. Discuss with your doctor what is most appropriate for you during your annual wellness visit. The different tests include: colonoscopy (considered the best screening method), a fecal occult blood test, a fecal DNA test, and sigmoidoscopy.    -A bone mass density test is recommended when a woman turns 65 to screen for osteoporosis. This test is only recommended one time, as a screening. Some providers will use this same test as a disease monitoring tool if you already have osteoporosis.  -Breast cancer screenings are recommended every other year for women of normal risk, age 4-74.  -Cervical cancer screenings for women over age 60 are only recommended with certain risk factors.     Here is a list of your current Health Maintenance items (your personalized list of preventive services) with a due date:  Health Maintenance Due   Topic Date Due   ??? Pap Test  07/10/2006   ??? Annual Well Visit  10/21/2016   ???  Flu Vaccine  08/18/2017         Medicare Wellness Visit, Male    The best way to live healthy is to have a lifestyle where you eat a well-balanced diet, exercise regularly, limit alcohol use, and quit all forms of tobacco/nicotine, if applicable.     Regular preventive services are another way to keep healthy. Preventive  services (vaccines, screening tests, monitoring & exams) can help personalize your care plan, which helps you manage your own care. Screening tests can find health problems at the earliest stages, when they are easiest to treat.   [facility] follows the current, evidence-based guidelines published by the [place] [employer] (USPSTF) when recommending preventive services for our patients. Because we follow these guidelines, sometimes recommendations change over time as research supports it. (For example, a prostate screening blood test is no longer routinely recommended for men with no symptoms).  Of course, you and your doctor may decide to screen more often for some diseases, based on your risk and co-morbidities (chronic disease you are already diagnosed with).     Preventive services for you include:  - Medicare offers their members a free annual wellness visit, which is time for you and your primary care provider to discuss and plan for your preventive service needs. Take advantage of this benefit every year!  -All adults over age 28 should receive the recommended pneumonia vaccines. Current USPSTF guidelines recommend a series of two vaccines for the best pneumonia protection.   -All adults should have a flu vaccine yearly and tetanus vaccine every 10 years.  -All adults age 3 and older should receive the shingles vaccines (series of two vaccines).       -All adults age 27-70 who are overweight should have a diabetes screening test once every three years.   -Other screening tests & preventive services for persons with diabetes include: an eye exam to screen for diabetic retinopathy, a kidney function test, a foot exam, and stricter control over your cholesterol.   -Cardiovascular screening for adults with routine risk involves an electrocardiogram (ECG) at intervals determined by the provider.    -Colorectal cancer screening should be done for adults age 42-75 with no increased risk factors for colorectal cancer.  There are a number of acceptable methods of screening for this type of cancer. Each test has its own benefits and drawbacks. Discuss with your provider what is most appropriate for you during your annual wellness visit. The different tests include: colonoscopy (considered the best screening method), a fecal occult blood test, a fecal DNA test, and sigmoidoscopy.  -All adults born between 1945 and 1965 should be screened once for Hepatitis C.  -An Abdominal Aortic Aneurysm (AAA) Screening is recommended for men age 46-75 who has ever smoked in their lifetime.     Here is a list of your current Health Maintenance items (your personalized list of preventive services) with a due date:  Health Maintenance Due   Topic Date Due   ??? Pap Test  07/10/2006   ??? Annual Well Visit  10/21/2016   ??? Flu Vaccine  08/18/2017

## 2018-02-14 NOTE — Progress Notes (Signed)
Chief Complaint   Patient presents with   ??? Annual Wellness Visit   ??? Constipation     1. Have you been to the ER, urgent care clinic since your last visit?  Hospitalized since your last visit?No    2. Have you seen or consulted any other health care providers outside of the [facility] since your last visit?  Include any pap smears or colon screening. No  This is the Subsequent Medicare Annual Wellness Exam, performed 12 months or more after the Initial AWV or the last Subsequent AWV    I have reviewed the patient's medical history in detail and updated the computerized patient record.     History     Patient Active Problem List   Diagnosis Code   ??? 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 Family history of colon cancer in father Z80.0   ??? FH: breast cancer in relative when <47 years old Z80.3   ??? Bipolar disorder in remission (HCC) F31.70   ??? Back pain M54.9   ??? Lower extremity weakness R29.898   ??? Gait abnormality R26.9   ??? Recurrent falls while walking R29.6     Past Medical History:   Diagnosis Date   ??? Asthma due to environmental allergies    ??? Bipolar 1 disorder (HCC)    ??? Cat allergies    ??? Cerebral palsy (HCC)    ??? MS (multiple sclerosis) (HCC)       Past Surgical History:   Procedure Laterality Date   ??? HX ORTHOPAEDIC  1998    quadricep lengthening surgery     Current Outpatient Medications   Medication Sig Dispense Refill   ??? albuterol (PROVENTIL HFA, VENTOLIN HFA, PROAIR HFA) 90 mcg/actuation inhaler Take 2 Puffs by inhalation every four (4) hours as needed for Wheezing. 1 Inhaler 0   ??? FLUoxetine (PROZAC) 20 mg capsule Take 20 mg by mouth daily.     ??? estradiol (ESTRACE) 2 mg tablet Take 2 mg by mouth daily.     ??? spironolactone (ALDACTONE) 25 mg tablet Take 100 mg by mouth two (2) times a day.     ??? clonazePAM (KLONOPIN) 1 mg tablet Take 1 mg by mouth every twelve (12) hours.     ??? citalopram (CELEXA) 20 mg tablet Take 20 mg by mouth nightly.      ??? linaCLOtide (LINZESS) 145 mcg cap capsule Take 1 Cap by mouth Daily (before breakfast). Use for constipation 30 Cap 0   ??? acetaminophen (TYLENOL) 500 mg tablet Take 2 Tabs by mouth every eight (8) hours as needed for Pain. Indications: Pain 90 Tab 1   ??? doxycycline (MONODOX) 100 mg capsule Take 100 mg by mouth two (2) times a day.       Allergies   Allergen Reactions   ??? Codeine Anaphylaxis   ??? Penicillins Anaphylaxis   ??? Toradol [Ketorolac] Anaphylaxis   ??? Grape Swelling   ??? Peanut Swelling   ??? Pineapple Swelling   ??? Tramadol Rash and Swelling       Family History   Problem Relation Age of Onset   ??? Psychiatric Disorder Mother    ??? Breast Cancer Mother    ??? Alcohol abuse Mother    ??? Hypertension Mother    ??? Elevated Lipids Mother    ??? Arthritis-rheumatoid Mother    ??? Colon Cancer Father    ??? Alcohol abuse Father    ??? Hypertension Father    ???  Diabetes Father    ??? Heart Failure Father    ??? Psychiatric Disorder Father    ??? Heart Disease Father    ??? Coronary Artery Disease Father    ??? Arthritis-osteo Father    ??? Prostate Cancer Paternal Uncle      Social History     Tobacco Use   ??? Smoking status: Current Every Day Smoker     Packs/day: 1.00     Years: 11.00     Pack years: 11.00   ??? Smokeless tobacco: Never Used   Substance Use Topics   ??? Alcohol use: Yes     Comment: very rarely       Depression Risk Factor Screening:     3 most recent PHQ Screens 02/16/2017   Little interest or pleasure in doing things Not at all   Feeling down, depressed, irritable, or hopeless Not at all   Total Score PHQ 2 0       Alcohol Risk Factor Screening (MALE < 65):   Do you average more than 2 drinks per night or 14 drinks a week: No    On any one occasion in the past three months have you have had more than 4 drinks containing alcohol:  No      Functional Ability and Level of Safety:   Hearing: Hearing is good.    Activities of Daily Living:  The home contains: no safety equipment.  Patient does total self care     Ambulation: with no difficulty    Fall Risk:  No flowsheet data found.    Abuse Screen:  Patient is not abused    Cognitive Screening   Has your family/caregiver stated any concerns about your memory: no  Cognitive Screening: Normal - MMSE (Mini Mental Status Exam)    Patient Care Team   Patient Care Team:  Mallie Snooks, MD as PCP - General (Family Practice)  Mallie Snooks, MD as PCP - [facility] Empaneled Provider    Assessment/Plan   Education and counseling provided:  Are appropriate based on today's review and evaluation        Health Maintenance Due   Topic Date Due   ??? PAP AKA CERVICAL CYTOLOGY  07/10/2006   ??? MEDICARE YEARLY EXAM  10/21/2016   ??? Influenza Age 45 to Adult  08/18/2017

## 2018-02-14 NOTE — Progress Notes (Signed)
SPORTS MEDICINE AND PRIMARY CARE  Dorthula Nettles. Devin Foskey,MD  [place] [address]      Chief Complaint   Patient presents with   ??? Annual Wellness Visit   ??? Constipation       SUBJECTIVE:    Rodney Arnold is a 33 y.o. adult for annual wellness and evaluation for ongoing constipation.     Pt has failed linzess prescribed at last visit and other conservative measures. Pt Is forced to disimpact pretty frequently. Pt denies abdominal pain, nausea, vomiting, wt changes. Hypothyroidism was rule out.  GI appt has been requested. Pt in with companion.    Asthma is very stable with no interim ED visits or exacerbations. Requests Albuterol mdi refill.    Current Outpatient Medications   Medication Sig Dispense Refill   ??? albuterol (PROVENTIL HFA, VENTOLIN HFA, PROAIR HFA) 90 mcg/actuation inhaler Take 2 Puffs by inhalation every four (4) hours as needed for Wheezing. 1 Inhaler 2   ??? lactulose (CHRONULAC) 10 gram/15 mL solution 3 tsp every day initially; may increase to 3 tsp every 8 hr prn constipation 480 mL 0   ??? FLUoxetine (PROZAC) 20 mg capsule Take 20 mg by mouth daily.     ??? estradiol (ESTRACE) 2 mg tablet Take 2 mg by mouth daily.     ??? spironolactone (ALDACTONE) 25 mg tablet Take 100 mg by mouth two (2) times a day.     ??? clonazePAM (KLONOPIN) 1 mg tablet Take 1 mg by mouth every twelve (12) hours.     ??? citalopram (CELEXA) 20 mg tablet Take 20 mg by mouth nightly.     ???       ??? acetaminophen (TYLENOL) 500 mg tablet Take 2 Tabs by mouth every eight (8) hours as needed for Pain. Indications: Pain 90 Tab 1   ??? doxycycline (MONODOX) 100 mg capsule Take 100 mg by mouth two (2) times a day.       Past Medical History:   Diagnosis Date   ??? Asthma due to environmental allergies    ??? Bipolar 1 disorder ([name])    ??? Cat allergies    ??? Cerebral palsy ([place])    ??? MS (multiple sclerosis) ([place])      Past Surgical History:   Procedure Laterality Date   ??? HX ORTHOPAEDIC  1998    quadricep lengthening surgery     Allergies    Allergen Reactions   ??? Codeine Anaphylaxis   ??? Penicillins Anaphylaxis   ??? Toradol [Ketorolac] Anaphylaxis   ??? Grape Swelling   ??? Peanut Swelling   ??? Pineapple Swelling   ??? Tramadol Rash and Swelling       REVIEW OF SYSTEMS:  Pertinent positives and negatives are listed in HPI    Social History     Socioeconomic History   ??? Marital status: MARRIED     Spouse name: Not on file   ??? Number of children: Not on file   ??? Years of education: Not on file   ??? Highest education level: Not on file   Tobacco Use   ??? Smoking status: Current Every Day Smoker     Packs/day: 1.00     Years: 11.00     Pack years: 11.00   ??? Smokeless tobacco: Never Used   Substance and Sexual Activity   ??? Alcohol use: Yes     Comment: very rarely   ??? Drug use: No   ??? Sexual activity: Yes  Partners: Male     Family History   Problem Relation Age of Onset   ??? Psychiatric Disorder Mother    ??? Breast Cancer Mother    ??? Alcohol abuse Mother    ??? Hypertension Mother    ??? Elevated Lipids Mother    ??? Arthritis-rheumatoid Mother    ??? Colon Cancer Father    ??? Alcohol abuse Father    ??? Hypertension Father    ??? Diabetes Father    ??? Heart Failure Father    ??? Psychiatric Disorder Father    ??? Heart Disease Father    ??? Coronary Artery Disease Father    ??? Arthritis-osteo Father    ??? Prostate Cancer Paternal Uncle        OBJECTIVE:     Visit Vitals  BP 118/76   Pulse 90   Temp 98.6 ??F (37 ??C) (Oral)   Resp 16   Ht '5\' 4"'$  (1.626 m)   Wt 103 lb 4.8 oz (46.9 kg)   SpO2 94%   BMI 17.73 kg/m??     CONSTITUTIONAL:  appears in usual state of health  EYES:  eom intact  ENMT:moist mucous membranes,  NECK: supple.   CVS: s1s2 rrr  RESPIRATORY: Chest: clear bilaterally  ABDOMEN: soft  INTEGUMENT: warm and dry  NEUROLOGIC: non-focal exam   MENTAL STATUS: alert and oriented, appropriate affect     Office Visit on 01/17/2018   Component Date Value Ref Range Status   ??? TSH 01/17/2018 0.910  0.450 - 4.500 uIU/mL Final   ??? WBC 01/17/2018 5.4  3.4 - 10.8 x10E3/uL Final    ??? RBC 01/17/2018 4.78  4.14 - 5.80 x10E6/uL Final   ??? HGB 01/17/2018 14.2  13.0 - 17.7 g/dL Final   ??? HCT 01/17/2018 41.9  37.5 - 51.0 % Final   ??? MCV 01/17/2018 88  79 - 97 fL Final   ??? [place] 01/17/2018 29.7  26.6 - 33.0 pg Final   ??? MCHC 01/17/2018 33.9  31.5 - 35.7 g/dL Final   ??? RDW 01/17/2018 12.0* 12.3 - 15.4 % Final    Comment: **Effective January 23, 2018, the RDW pediatric reference**    interval will be removed and the adult reference interval    will be changing to:                             Male 11.7 - 15.4                                                       Male 11.6 - 15.4     ??? PLATELET 01/17/2018 200  150 - 450 x10E3/uL Final   ??? NEUTROPHILS 01/17/2018 60  Not Estab. % Final   ??? Lymphocytes 01/17/2018 29  Not Estab. % Final   ??? MONOCYTES 01/17/2018 8  Not Estab. % Final   ??? EOSINOPHILS 01/17/2018 2  Not Estab. % Final   ??? BASOPHILS 01/17/2018 1  Not Estab. % Final   ??? ABS. NEUTROPHILS 01/17/2018 3.2  1.4 - 7.0 x10E3/uL Final   ??? Abs Lymphocytes 01/17/2018 1.6  0.7 - 3.1 x10E3/uL Final   ??? ABS. MONOCYTES 01/17/2018 0.5  0.1 - 0.9 x10E3/uL Final   ??? ABS. EOSINOPHILS 01/17/2018 0.1  0.0 - 0.4  x10E3/uL Final   ??? ABS. BASOPHILS 01/17/2018 0.1  0.0 - 0.2 x10E3/uL Final   ??? IMMATURE GRANULOCYTES 01/17/2018 0  Not Estab. % Final   ??? ABS. IMM. GRANS. 01/17/2018 0.0  0.0 - 0.1 x10E3/uL Final   ??? Glucose 01/17/2018 87  65 - 99 mg/dL Final   ??? BUN 01/17/2018 12  6 - 20 mg/dL Final   ??? Creatinine 01/17/2018 0.82  0.76 - 1.27 mg/dL Final   ??? GFR est non-AA 01/17/2018 117  >59 mL/min/1.73 Final   ??? GFR est AA 01/17/2018 135  >59 mL/min/1.73 Final   ??? BUN/Creatinine ratio 01/17/2018 15  9 - 20 Final   ??? Sodium 01/17/2018 142  134 - 144 mmol/L Final   ??? Potassium 01/17/2018 4.5  3.5 - 5.2 mmol/L Final   ??? Chloride 01/17/2018 104  96 - 106 mmol/L Final   ??? CO2 01/17/2018 21  20 - 29 mmol/L Final   ??? Calcium 01/17/2018 9.0  8.7 - 10.2 mg/dL Final   ??? Protein, total 01/17/2018 6.6  6.0 - 8.5 g/dL Final    ??? Albumin 01/17/2018 4.3  3.5 - 5.5 g/dL Final   ??? GLOBULIN, TOTAL 01/17/2018 2.3  1.5 - 4.5 g/dL Final   ??? A-G Ratio 01/17/2018 1.9  1.2 - 2.2 Final   ??? Bilirubin, total 01/17/2018 0.3  0.0 - 1.2 mg/dL Final   ??? Alk. phosphatase 01/17/2018 62  39 - 117 IU/L Final   ??? AST (SGOT) 01/17/2018 11  0 - 40 IU/L Final   ??? ALT (SGPT) 01/17/2018 8  0 - 44 IU/L Final   ??? Specific Gravity 01/17/2018      >=1.030* 1.005 - 1.030 Final   ??? pH (UA) 01/17/2018 6.5  5.0 - 7.5 Final   ??? Color 01/17/2018 Yellow  Yellow Final   ??? Appearance 01/17/2018 Clear  Clear Final   ??? Leukocyte Esterase 01/17/2018 Negative  Negative Final   ??? Protein 01/17/2018 Trace  Negative/Trace Final   ??? Glucose 01/17/2018 Negative  Negative Final   ??? Ketone 01/17/2018 Negative  Negative Final   ??? Blood 01/17/2018 Negative  Negative Final   ??? Bilirubin 01/17/2018 Negative  Negative Final   ??? Urobilinogen 01/17/2018 1.0  0.2 - 1.0 mg/dL Final   ??? Nitrites 01/17/2018 Negative  Negative Final   ??? Microscopic Examination 01/17/2018 Comment   Final    Microscopic not indicated and not performed.   ??? Amphetamines, urine 01/17/2018 Negative  Cutoff=1000 ng/mL Final    Amphetamine test includes Amphetamine and Methamphetamine.   ??? MDMA 01/17/2018 Negative  Cutoff=500 ng/mL Final   ??? Barbiturates 01/17/2018 Negative  Cutoff=300 ng/mL Final   ??? Benzodiazepines 01/17/2018 Negative  Cutoff=300 ng/mL Final   ??? Cannabinoids 01/17/2018 Negative  Cutoff=50 ng/mL Final   ??? Cocaine 01/17/2018 Negative  Cutoff=300 ng/mL Final   ??? Methaqualone 01/17/2018 Negative  Cutoff=300 ng/mL Final   ??? Opiates 01/17/2018 Negative  Cutoff=300 ng/mL Final    Opiate test includes Codeine and Morphine only.   ??? Phencyclidine 01/17/2018 Negative  Cutoff=25 ng/mL Final   ??? Methadone Screen, Urine 01/17/2018 Positive* Cutoff=300 Final      Methadone GC/MS Conf      >5000              ng/mL Cutoff=100       01   ??? PROPOXYPHENE, URINE 01/17/2018 Negative  Cutoff=300 ng/mL Final       ASSESSMENT:    1. Constipation, unspecified constipation type  2. Medicare annual wellness visit, subsequent    3. Screening for depression    4. Screening for alcoholism    5. Mild intermittent asthma without complication -stable       I have discussed the diagnosis with the patient and the intended plan as seen in the  orders.  The patient understands and agees with the plan.  The patient has   received an after visit summary and questions were answered concerning  future plans  Patient labs and/or xrays were reviewed  Past records were reviewed.    PLAN:  .  Orders Placed This Encounter   ??? Depression Screen Annual   ??? albuterol (PROVENTIL HFA, VENTOLIN HFA, PROAIR HFA) 90 mcg/actuation inhaler   ??? lactulose (CHRONULAC) 10 gram/15 mL solution   gi evaluation arranged last visit  Fiber,   64 ox water daily  Exercise  Counseled regarding diet, exercise and healthy lifestyle  RO 4-6 mo    A total of at least an additional 15 min was spent during this evaluation,outside of the wellness component,  of which half was spent in counseling and care coordination.

## 2018-02-14 NOTE — Progress Notes (Signed)
 Chief Complaint   Patient presents with   . Annual Wellness Visit   . Constipation     1. Have you been to the ER, urgent care clinic since your last visit?  Hospitalized since your last visit?No    2. Have you seen or consulted any other health care providers outside of the [facility] since your last visit?  Include any pap smears or colon screening. No  This is the Subsequent Medicare Annual Wellness Exam, performed 12 months or more after the Initial AWV or the last Subsequent AWV    I have reviewed the patient's medical history in detail and updated the computerized patient record.     History     Patient Active Problem List   Diagnosis Code   . 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 Family history of colon cancer in father Z80.0   . FH: breast cancer in relative when <69 years old Z80.3   . Bipolar disorder in remission (HCC) F31.70   . Back pain M54.9   . Lower extremity weakness R29.898   . Gait abnormality R26.9   . Recurrent falls while walking R29.6     Past Medical History:   Diagnosis Date   . Asthma due to environmental allergies    . Bipolar 1 disorder (HCC)    . Cat allergies    . Cerebral palsy (HCC)    . MS (multiple sclerosis) (HCC)       Past Surgical History:   Procedure Laterality Date   . HX ORTHOPAEDIC  1998    quadricep lengthening surgery     Current Outpatient Medications   Medication Sig Dispense Refill   . albuterol (PROVENTIL HFA, VENTOLIN HFA, PROAIR HFA) 90 mcg/actuation inhaler Take 2 Puffs by inhalation every four (4) hours as needed for Wheezing. 1 Inhaler 0   . FLUoxetine (PROZAC) 20 mg capsule Take 20 mg by mouth daily.     SABRA estradiol (ESTRACE) 2 mg tablet Take 2 mg by mouth daily.     SABRA spironolactone (ALDACTONE) 25 mg tablet Take 100 mg by mouth two (2) times a day.     . clonazePAM (KLONOPIN) 1 mg tablet Take 1 mg by mouth every twelve (12) hours.     . citalopram (CELEXA) 20 mg tablet Take 20 mg by mouth nightly.     . linaCLOtide (LINZESS) 145 mcg cap  capsule Take 1 Cap by mouth Daily (before breakfast). Use for constipation 30 Cap 0   . acetaminophen (TYLENOL) 500 mg tablet Take 2 Tabs by mouth every eight (8) hours as needed for Pain. Indications: Pain 90 Tab 1   . doxycycline (MONODOX) 100 mg capsule Take 100 mg by mouth two (2) times a day.       Allergies   Allergen Reactions   . Codeine Anaphylaxis   . Penicillins Anaphylaxis   . Toradol [Ketorolac] Anaphylaxis   . Grape Swelling   . Peanut Swelling   . Pineapple Swelling   . Tramadol Rash and Swelling       Family History   Problem Relation Age of Onset   . Psychiatric Disorder Mother    . Breast Cancer Mother    . Alcohol abuse Mother    . Hypertension Mother    . Elevated Lipids Mother    . Arthritis-rheumatoid Mother    . Colon Cancer Father    . Alcohol abuse Father    . Hypertension Father    .  Diabetes Father    . Heart Failure Father    . Psychiatric Disorder Father    . Heart Disease Father    . Coronary Artery Disease Father    . Arthritis-osteo Father    . Prostate Cancer Paternal Uncle      Social History     Tobacco Use   . Smoking status: Current Every Day Smoker     Packs/day: 1.00     Years: 11.00     Pack years: 11.00   . Smokeless tobacco: Never Used   Substance Use Topics   . Alcohol use: Yes     Comment: very rarely       Depression Risk Factor Screening:     3 most recent PHQ Screens 02/16/2017   Little interest or pleasure in doing things Not at all   Feeling down, depressed, irritable, or hopeless Not at all   Total Score PHQ 2 0       Alcohol Risk Factor Screening (MALE < 65):   Do you average more than 2 drinks per night or 14 drinks a week: No    On any one occasion in the past three months have you have had more than 4 drinks containing alcohol:  No      Functional Ability and Level of Safety:   Hearing: Hearing is good.    Activities of Daily Living:  The home contains: no safety equipment.  Patient does total self care    Ambulation: with no difficulty    Fall Risk:  No flowsheet  data found.    Abuse Screen:  Patient is not abused    Cognitive Screening   Has your family/caregiver stated any concerns about your memory: no  Cognitive Screening: Normal - MMSE (Mini Mental Status Exam)    Patient Care Team   Patient Care Team:  Blondie Rosella, MD as PCP - General (Family Practice)  Blondie Rosella, MD as PCP - [facility] Empaneled Provider    Assessment/Plan   Education and counseling provided:  Are appropriate based on today's review and evaluation        Health Maintenance Due   Topic Date Due   . PAP AKA CERVICAL CYTOLOGY  07/10/2006   . MEDICARE YEARLY EXAM  10/21/2016   . Influenza Age 20 to Adult  08/18/2017

## 2018-02-14 NOTE — Progress Notes (Signed)
SPORTS MEDICINE AND PRIMARY CARE  Dorthula Nettles. Devin Foskey,MD  [place] [address]      Chief Complaint   Patient presents with   ??? Annual Wellness Visit   ??? Constipation       SUBJECTIVE:    Rodney Arnold is a 33 y.o. adult for annual wellness and evaluation for ongoing constipation.     Pt has failed linzess prescribed at last visit and other conservative measures. Pt Is forced to disimpact pretty frequently. Pt denies abdominal pain, nausea, vomiting, wt changes. Hypothyroidism was rule out.  GI appt has been requested. Pt in with companion.    Asthma is very stable with no interim ED visits or exacerbations. Requests Albuterol mdi refill.    Current Outpatient Medications   Medication Sig Dispense Refill   ??? albuterol (PROVENTIL HFA, VENTOLIN HFA, PROAIR HFA) 90 mcg/actuation inhaler Take 2 Puffs by inhalation every four (4) hours as needed for Wheezing. 1 Inhaler 2   ??? lactulose (CHRONULAC) 10 gram/15 mL solution 3 tsp every day initially; may increase to 3 tsp every 8 hr prn constipation 480 mL 0   ??? FLUoxetine (PROZAC) 20 mg capsule Take 20 mg by mouth daily.     ??? estradiol (ESTRACE) 2 mg tablet Take 2 mg by mouth daily.     ??? spironolactone (ALDACTONE) 25 mg tablet Take 100 mg by mouth two (2) times a day.     ??? clonazePAM (KLONOPIN) 1 mg tablet Take 1 mg by mouth every twelve (12) hours.     ??? citalopram (CELEXA) 20 mg tablet Take 20 mg by mouth nightly.     ???       ??? acetaminophen (TYLENOL) 500 mg tablet Take 2 Tabs by mouth every eight (8) hours as needed for Pain. Indications: Pain 90 Tab 1   ??? doxycycline (MONODOX) 100 mg capsule Take 100 mg by mouth two (2) times a day.       Past Medical History:   Diagnosis Date   ??? Asthma due to environmental allergies    ??? Bipolar 1 disorder ([name])    ??? Cat allergies    ??? Cerebral palsy ([place])    ??? MS (multiple sclerosis) ([place])      Past Surgical History:   Procedure Laterality Date   ??? HX ORTHOPAEDIC  1998    quadricep lengthening surgery     Allergies    Allergen Reactions   ??? Codeine Anaphylaxis   ??? Penicillins Anaphylaxis   ??? Toradol [Ketorolac] Anaphylaxis   ??? Grape Swelling   ??? Peanut Swelling   ??? Pineapple Swelling   ??? Tramadol Rash and Swelling       REVIEW OF SYSTEMS:  Pertinent positives and negatives are listed in HPI    Social History     Socioeconomic History   ??? Marital status: MARRIED     Spouse name: Not on file   ??? Number of children: Not on file   ??? Years of education: Not on file   ??? Highest education level: Not on file   Tobacco Use   ??? Smoking status: Current Every Day Smoker     Packs/day: 1.00     Years: 11.00     Pack years: 11.00   ??? Smokeless tobacco: Never Used   Substance and Sexual Activity   ??? Alcohol use: Yes     Comment: very rarely   ??? Drug use: No   ??? Sexual activity: Yes  Partners: Male     Family History   Problem Relation Age of Onset   ??? Psychiatric Disorder Mother    ??? Breast Cancer Mother    ??? Alcohol abuse Mother    ??? Hypertension Mother    ??? Elevated Lipids Mother    ??? Arthritis-rheumatoid Mother    ??? Colon Cancer Father    ??? Alcohol abuse Father    ??? Hypertension Father    ??? Diabetes Father    ??? Heart Failure Father    ??? Psychiatric Disorder Father    ??? Heart Disease Father    ??? Coronary Artery Disease Father    ??? Arthritis-osteo Father    ??? Prostate Cancer Paternal Uncle        OBJECTIVE:     Visit Vitals  BP 118/76   Pulse 90   Temp 98.6 ??F (37 ??C) (Oral)   Resp 16   Ht 5' 4"  (1.626 m)   Wt 103 lb 4.8 oz (46.9 kg)   SpO2 94%   BMI 17.73 kg/m??     CONSTITUTIONAL:  appears in usual state of health  EYES:  eom intact  ENMT:moist mucous membranes,  NECK: supple.   CVS: s1s2 rrr  RESPIRATORY: Chest: clear bilaterally  ABDOMEN: soft  INTEGUMENT: warm and dry  NEUROLOGIC: non-focal exam   MENTAL STATUS: alert and oriented, appropriate affect     Office Visit on 01/17/2018   Component Date Value Ref Range Status   ??? TSH 01/17/2018 0.910  0.450 - 4.500 uIU/mL Final   ??? WBC 01/17/2018 5.4  3.4 - 10.8 x10E3/uL Final   ??? RBC  01/17/2018 4.78  4.14 - 5.80 x10E6/uL Final   ??? HGB 01/17/2018 14.2  13.0 - 17.7 g/dL Final   ??? HCT 01/17/2018 41.9  37.5 - 51.0 % Final   ??? MCV 01/17/2018 88  79 - 97 fL Final   ??? [place] 01/17/2018 29.7  26.6 - 33.0 pg Final   ??? MCHC 01/17/2018 33.9  31.5 - 35.7 g/dL Final   ??? RDW 01/17/2018 12.0* 12.3 - 15.4 % Final    Comment: **Effective January 23, 2018, the RDW pediatric reference**    interval will be removed and the adult reference interval    will be changing to:                             Male 11.7 - 15.4                                                       Male 11.6 - 15.4     ??? PLATELET 01/17/2018 200  150 - 450 x10E3/uL Final   ??? NEUTROPHILS 01/17/2018 60  Not Estab. % Final   ??? Lymphocytes 01/17/2018 29  Not Estab. % Final   ??? MONOCYTES 01/17/2018 8  Not Estab. % Final   ??? EOSINOPHILS 01/17/2018 2  Not Estab. % Final   ??? BASOPHILS 01/17/2018 1  Not Estab. % Final   ??? ABS. NEUTROPHILS 01/17/2018 3.2  1.4 - 7.0 x10E3/uL Final   ??? Abs Lymphocytes 01/17/2018 1.6  0.7 - 3.1 x10E3/uL Final   ??? ABS. MONOCYTES 01/17/2018 0.5  0.1 - 0.9 x10E3/uL Final   ??? ABS. EOSINOPHILS 01/17/2018 0.1  0.0 - 0.4  x10E3/uL Final   ??? ABS. BASOPHILS 01/17/2018 0.1  0.0 - 0.2 x10E3/uL Final   ??? IMMATURE GRANULOCYTES 01/17/2018 0  Not Estab. % Final   ??? ABS. IMM. GRANS. 01/17/2018 0.0  0.0 - 0.1 x10E3/uL Final   ??? Glucose 01/17/2018 87  65 - 99 mg/dL Final   ??? BUN 01/17/2018 12  6 - 20 mg/dL Final   ??? Creatinine 01/17/2018 0.82  0.76 - 1.27 mg/dL Final   ??? GFR est non-AA 01/17/2018 117  >59 mL/min/1.73 Final   ??? GFR est AA 01/17/2018 135  >59 mL/min/1.73 Final   ??? BUN/Creatinine ratio 01/17/2018 15  9 - 20 Final   ??? Sodium 01/17/2018 142  134 - 144 mmol/L Final   ??? Potassium 01/17/2018 4.5  3.5 - 5.2 mmol/L Final   ??? Chloride 01/17/2018 104  96 - 106 mmol/L Final   ??? CO2 01/17/2018 21  20 - 29 mmol/L Final   ??? Calcium 01/17/2018 9.0  8.7 - 10.2 mg/dL Final   ??? Protein, total 01/17/2018 6.6  6.0 - 8.5 g/dL Final   ??? Albumin  01/17/2018 4.3  3.5 - 5.5 g/dL Final   ??? GLOBULIN, TOTAL 01/17/2018 2.3  1.5 - 4.5 g/dL Final   ??? A-G Ratio 01/17/2018 1.9  1.2 - 2.2 Final   ??? Bilirubin, total 01/17/2018 0.3  0.0 - 1.2 mg/dL Final   ??? Alk. phosphatase 01/17/2018 62  39 - 117 IU/L Final   ??? AST (SGOT) 01/17/2018 11  0 - 40 IU/L Final   ??? ALT (SGPT) 01/17/2018 8  0 - 44 IU/L Final   ??? Specific Gravity 01/17/2018      >=1.030* 1.005 - 1.030 Final   ??? pH (UA) 01/17/2018 6.5  5.0 - 7.5 Final   ??? Color 01/17/2018 Yellow  Yellow Final   ??? Appearance 01/17/2018 Clear  Clear Final   ??? Leukocyte Esterase 01/17/2018 Negative  Negative Final   ??? Protein 01/17/2018 Trace  Negative/Trace Final   ??? Glucose 01/17/2018 Negative  Negative Final   ??? Ketone 01/17/2018 Negative  Negative Final   ??? Blood 01/17/2018 Negative  Negative Final   ??? Bilirubin 01/17/2018 Negative  Negative Final   ??? Urobilinogen 01/17/2018 1.0  0.2 - 1.0 mg/dL Final   ??? Nitrites 01/17/2018 Negative  Negative Final   ??? Microscopic Examination 01/17/2018 Comment   Final    Microscopic not indicated and not performed.   ??? Amphetamines, urine 01/17/2018 Negative  Cutoff=1000 ng/mL Final    Amphetamine test includes Amphetamine and Methamphetamine.   ??? MDMA 01/17/2018 Negative  Cutoff=500 ng/mL Final   ??? Barbiturates 01/17/2018 Negative  Cutoff=300 ng/mL Final   ??? Benzodiazepines 01/17/2018 Negative  Cutoff=300 ng/mL Final   ??? Cannabinoids 01/17/2018 Negative  Cutoff=50 ng/mL Final   ??? Cocaine 01/17/2018 Negative  Cutoff=300 ng/mL Final   ??? Methaqualone 01/17/2018 Negative  Cutoff=300 ng/mL Final   ??? Opiates 01/17/2018 Negative  Cutoff=300 ng/mL Final    Opiate test includes Codeine and Morphine only.   ??? Phencyclidine 01/17/2018 Negative  Cutoff=25 ng/mL Final   ??? Methadone Screen, Urine 01/17/2018 Positive* Cutoff=300 Final      Methadone GC/MS Conf      >5000              ng/mL Cutoff=100       01   ??? PROPOXYPHENE, URINE 01/17/2018 Negative  Cutoff=300 ng/mL Final       ASSESSMENT:   1.  Constipation, unspecified constipation type  2. Medicare annual wellness visit, subsequent    3. Screening for depression    4. Screening for alcoholism    5. Mild intermittent asthma without complication -stable       I have discussed the diagnosis with the patient and the intended plan as seen in the  orders.  The patient understands and agees with the plan.  The patient has   received an after visit summary and questions were answered concerning  future plans  Patient labs and/or xrays were reviewed  Past records were reviewed.    PLAN:  .  Orders Placed This Encounter   ??? Depression Screen Annual   ??? albuterol (PROVENTIL HFA, VENTOLIN HFA, PROAIR HFA) 90 mcg/actuation inhaler   ??? lactulose (CHRONULAC) 10 gram/15 mL solution   gi evaluation arranged last visit  Fiber,   64 ox water daily  Exercise  Counseled regarding diet, exercise and healthy lifestyle  RO 4-6 mo    A total of at least an additional 15 min was spent during this evaluation,outside of the wellness component,  of which half was spent in counseling and care coordination.

## 2018-04-05 ENCOUNTER — Ambulatory Visit
Admit: 2018-04-05 | Discharge: 2018-04-05 | Payer: PRIVATE HEALTH INSURANCE | Attending: Adolescent Medicine | Primary: Adolescent Medicine

## 2018-04-05 ENCOUNTER — Ambulatory Visit: Attending: Adolescent Medicine | Primary: Adolescent Medicine

## 2018-04-05 DIAGNOSIS — W19XXXA Unspecified fall, initial encounter: Secondary | ICD-10-CM

## 2018-04-05 NOTE — Progress Notes (Signed)
Chief Complaint   Patient presents with   ??? Fall   ??? Form Completion     1. Have you been to the ER, urgent care clinic since your last visit?  Hospitalized since your last visit?No    2. Have you seen or consulted any other health care providers outside of the [facility] since your last visit?  Include any pap smears or colon screening. No

## 2018-04-05 NOTE — Progress Notes (Signed)
SPORTS MEDICINE AND PRIMARY CARE  Rodney Arnold. Rodney Batz,MD  [address] [place] [address]    Chief Complaint   Patient presents with   ??? Fall   ??? Form Completion       SUBJECTIVE:    Rodney Arnold is a 33 y.o. adult here for form completion.  Patient also had a fall recently.    Patient has two forms for completion.  The first form is a Adult nurse form, but the form is in the patient's husband's name and we have already alerted the patient that we are unable to complete the form without his name being on the form. The second form is a loan forgiveness form.  Patient is unable to be gainfully employed due to the severity of his multiple sclerosis.      Patient had a fall down a few flights of steps recently.  He reports discomfort in his right hip.  He did not go to the ED.  Applied ice and heat. Patient is very lethargic and uncooperative during today's visit.  He declined x-rays, stating he already has a hairline fracture in his right hip.          Current Outpatient Medications   Medication Sig Dispense Refill   ??? albuterol (PROVENTIL HFA, VENTOLIN HFA, PROAIR HFA) 90 mcg/actuation inhaler Take 2 Puffs by inhalation every four (4) hours as needed for Wheezing. 1 Inhaler 2   ??? lactulose (CHRONULAC) 10 gram/15 mL solution 3 tsp every day initially; may increase to 3 tsp every 8 hr prn constipation 480 mL 0   ??? linaCLOtide (LINZESS) 145 mcg cap capsule Take 1 Cap by mouth Daily (before breakfast). Use for constipation 30 Cap 0   ??? acetaminophen (TYLENOL) 500 mg tablet Take 2 Tabs by mouth every eight (8) hours as needed for Pain. Indications: Pain 90 Tab 1   ??? FLUoxetine (PROZAC) 20 mg capsule Take 20 mg by mouth daily.     ??? estradiol (ESTRACE) 2 mg tablet Take 2 mg by mouth daily.     ??? spironolactone (ALDACTONE) 25 mg tablet Take 100 mg by mouth two (2) times a day.     ??? clonazePAM (KLONOPIN) 1 mg tablet Take 1 mg by mouth every twelve (12) hours.      ??? citalopram (CELEXA) 20 mg tablet Take 20 mg by mouth nightly.     ??? doxycycline (MONODOX) 100 mg capsule Take 100 mg by mouth two (2) times a day.       Past Medical History:   Diagnosis Date   ??? Asthma due to environmental allergies    ??? Bipolar 1 disorder (HCC)    ??? Cat allergies    ??? Cerebral palsy (HCC)    ??? MS (multiple sclerosis) (HCC)      Past Surgical History:   Procedure Laterality Date   ??? HX ORTHOPAEDIC  1998    quadricep lengthening surgery     Allergies   Allergen Reactions   ??? Codeine Anaphylaxis   ??? Penicillins Anaphylaxis   ??? Toradol [Ketorolac] Anaphylaxis   ??? Grape Swelling   ??? Peanut Swelling   ??? Pineapple Swelling   ??? Tramadol Rash and Swelling       REVIEW OF SYSTEMS:  General: negative for - chills or fever  ENT: negative for - headaches,   Respiratory: negative for - cough, , shortness of breath   Cardiovascular : negative for - chest pain,   Gastrointestinal: negative for -  abdominal pain,   Musculoskeletal: +gait disturbance; negative for - , joint pain, joint stiffness or joint swelling  Hematologic: no bruises, no bleeding,   Integument: no lumps,   Endocrine:no malaise/lethargy       Social History     Socioeconomic History   ??? Marital status: MARRIED     Spouse name: Not on file   ??? Number of children: Not on file   ??? Years of education: Not on file   ??? Highest education level: Not on file   Tobacco Use   ??? Smoking status: Current Every Day Smoker     Packs/day: 1.00     Years: 11.00     Pack years: 11.00   ??? Smokeless tobacco: Never Used   Substance and Sexual Activity   ??? Alcohol use: Yes     Comment: very rarely   ??? Drug use: No   ??? Sexual activity: Yes     Partners: Male     Family History   Problem Relation Age of Onset   ??? Psychiatric Disorder Mother    ??? Breast Cancer Mother    ??? Alcohol abuse Mother    ??? Hypertension Mother    ??? Elevated Lipids Mother    ??? Arthritis-rheumatoid Mother    ??? Colon Cancer Father    ??? Alcohol abuse Father    ??? Hypertension Father     ??? Diabetes Father    ??? Heart Failure Father    ??? Psychiatric Disorder Father    ??? Heart Disease Father    ??? Coronary Artery Disease Father    ??? Arthritis-osteo Father    ??? Prostate Cancer Paternal Uncle        OBJECTIVE:     Visit Vitals  BP 111/76   Pulse 97   Temp 98 ??F (36.7 ??C) (Oral)   Resp 15   Ht 5\' 4"  (1.626 m)   Wt 105 lb 3.2 oz (47.7 kg)   SpO2 94%   BMI 18.06 kg/m??     CONSTITUTIONAL:  appears very drowsy  EYES:  eom intact  NECK: supple.   RESPIRATORY: Chest: clear bilaterally  COR s1s2 rrr  INTEGUMENT: warm and dry  NEUROLOGIC: non-focal exam   MENTAL STATUS: alert and oriented, appropriate affect     ASSESSMENT:   Fall- pt seems stable; pt dose not agree to have x-rays but requests "something for pain" despite possibly being under the influence of a sedating medication in the office    I have discussed the diagnosis with the patient and the intended plan as seen in the  orders.  The patient understands and agees with the plan.  The patient has   received an after visit summary and questions were answered concerning  future plans  Patient labs and/or xrays were reviewed  Past records were reviewed.    PLAN:  Patient left office abruptly after deciding not to have x-rays and after spending a lot of time on the phone with [employer] during visit in an attempt to get situated with his [employer] form.   RTO prn

## 2018-04-05 NOTE — Progress Notes (Signed)
SPORTS MEDICINE AND PRIMARY CARE  Estrella Myrtle. Jerran Tappan,MD  [address] [place] [address]    Chief Complaint   Patient presents with   ??? Fall   ??? Form Completion       SUBJECTIVE:    Rodney Arnold is a 33 y.o. adult here for form completion.  Patient also had a fall recently.    Patient has two forms for completion.  The first form is a Adult nurse form, but the form is in the patient's husband's name and we have already alerted the patient that we are unable to complete the form without his name being on the form. The second form is a loan forgiveness form.  Patient is unable to be gainfully employed due to the severity of his multiple sclerosis.      Patient had a fall down a few flights of steps recently.  He reports discomfort in his right hip.  He did not go to the ED.  Applied ice and heat. Patient is very lethargic and uncooperative during today's visit.  He declined x-rays, stating he already has a hairline fracture in his right hip.          Current Outpatient Medications   Medication Sig Dispense Refill   ??? albuterol (PROVENTIL HFA, VENTOLIN HFA, PROAIR HFA) 90 mcg/actuation inhaler Take 2 Puffs by inhalation every four (4) hours as needed for Wheezing. 1 Inhaler 2   ??? lactulose (CHRONULAC) 10 gram/15 mL solution 3 tsp every day initially; may increase to 3 tsp every 8 hr prn constipation 480 mL 0   ??? linaCLOtide (LINZESS) 145 mcg cap capsule Take 1 Cap by mouth Daily (before breakfast). Use for constipation 30 Cap 0   ??? acetaminophen (TYLENOL) 500 mg tablet Take 2 Tabs by mouth every eight (8) hours as needed for Pain. Indications: Pain 90 Tab 1   ??? FLUoxetine (PROZAC) 20 mg capsule Take 20 mg by mouth daily.     ??? estradiol (ESTRACE) 2 mg tablet Take 2 mg by mouth daily.     ??? spironolactone (ALDACTONE) 25 mg tablet Take 100 mg by mouth two (2) times a day.     ??? clonazePAM (KLONOPIN) 1 mg tablet Take 1 mg by mouth every twelve (12) hours.     ??? citalopram (CELEXA) 20 mg tablet Take 20 mg by mouth  nightly.     ??? doxycycline (MONODOX) 100 mg capsule Take 100 mg by mouth two (2) times a day.       Past Medical History:   Diagnosis Date   ??? Asthma due to environmental allergies    ??? Bipolar 1 disorder (HCC)    ??? Cat allergies    ??? Cerebral palsy (HCC)    ??? MS (multiple sclerosis) (HCC)      Past Surgical History:   Procedure Laterality Date   ??? HX ORTHOPAEDIC  1998    quadricep lengthening surgery     Allergies   Allergen Reactions   ??? Codeine Anaphylaxis   ??? Penicillins Anaphylaxis   ??? Toradol [Ketorolac] Anaphylaxis   ??? Grape Swelling   ??? Peanut Swelling   ??? Pineapple Swelling   ??? Tramadol Rash and Swelling       REVIEW OF SYSTEMS:  General: negative for - chills or fever  ENT: negative for - headaches,   Respiratory: negative for - cough, , shortness of breath   Cardiovascular : negative for - chest pain,   Gastrointestinal: negative for -  abdominal pain,   Musculoskeletal: +gait disturbance; negative for - , joint pain, joint stiffness or joint swelling  Hematologic: no bruises, no bleeding,   Integument: no lumps,   Endocrine:no malaise/lethargy       Social History     Socioeconomic History   ??? Marital status: MARRIED     Spouse name: Not on file   ??? Number of children: Not on file   ??? Years of education: Not on file   ??? Highest education level: Not on file   Tobacco Use   ??? Smoking status: Current Every Day Smoker     Packs/day: 1.00     Years: 11.00     Pack years: 11.00   ??? Smokeless tobacco: Never Used   Substance and Sexual Activity   ??? Alcohol use: Yes     Comment: very rarely   ??? Drug use: No   ??? Sexual activity: Yes     Partners: Male     Family History   Problem Relation Age of Onset   ??? Psychiatric Disorder Mother    ??? Breast Cancer Mother    ??? Alcohol abuse Mother    ??? Hypertension Mother    ??? Elevated Lipids Mother    ??? Arthritis-rheumatoid Mother    ??? Colon Cancer Father    ??? Alcohol abuse Father    ??? Hypertension Father    ??? Diabetes Father    ??? Heart Failure Father    ??? Psychiatric Disorder  Father    ??? Heart Disease Father    ??? Coronary Artery Disease Father    ??? Arthritis-osteo Father    ??? Prostate Cancer Paternal Uncle        OBJECTIVE:     Visit Vitals  BP 111/76   Pulse 97   Temp 98 ??F (36.7 ??C) (Oral)   Resp 15   Ht 5\' 4"  (1.626 m)   Wt 105 lb 3.2 oz (47.7 kg)   SpO2 94%   BMI 18.06 kg/m??     CONSTITUTIONAL:  appears very drowsy  EYES:  eom intact  NECK: supple.   RESPIRATORY: Chest: clear bilaterally  COR s1s2 rrr  INTEGUMENT: warm and dry  NEUROLOGIC: non-focal exam   MENTAL STATUS: alert and oriented, appropriate affect     ASSESSMENT:   Fall- pt seems stable; pt dose not agree to have x-rays but requests "something for pain" despite possibly being under the influence of a sedating medication in the office    I have discussed the diagnosis with the patient and the intended plan as seen in the  orders.  The patient understands and agees with the plan.  The patient has   received an after visit summary and questions were answered concerning  future plans  Patient labs and/or xrays were reviewed  Past records were reviewed.    PLAN:  Patient left office abruptly after deciding not to have x-rays and after spending a lot of time on the phone with [employer] during visit in an attempt to get situated with his [employer] form.   RTO prn

## 2018-04-05 NOTE — Progress Notes (Signed)
Chief Complaint   Patient presents with   . Fall   . Form Completion     1. Have you been to the ER, urgent care clinic since your last visit?  Hospitalized since your last visit?No    2. Have you seen or consulted any other health care providers outside of the [facility] since your last visit?  Include any pap smears or colon screening. No

## 2018-06-23 ENCOUNTER — Telehealth
Admit: 2018-06-23 | Discharge: 2018-06-23 | Payer: PRIVATE HEALTH INSURANCE | Attending: Adolescent Medicine | Primary: Adolescent Medicine

## 2018-06-23 ENCOUNTER — Telehealth: Attending: Adolescent Medicine | Primary: Adolescent Medicine

## 2018-06-23 DIAGNOSIS — R21 Rash and other nonspecific skin eruption: Secondary | ICD-10-CM

## 2018-06-23 NOTE — Progress Notes (Signed)
1. Have you been to the ER, urgent care clinic since your last visit?  Hospitalized since your last visit?No    2. Have you seen or consulted any other health care providers outside of the [facility] since your last visit?  Include any pap smears or colon screening. No    Pt suspects bite bite

## 2018-06-23 NOTE — Progress Notes (Signed)
Rodney Arnold is a 33 y.o. adult who was seen by synchronous (real-time) audio-video technology on 06/23/2018.      Consent: Lavoris Ricketson, who was seen by synchronous (real-time) audio-video technology, and/or her healthcare decision maker, is aware that this patient-initiated, Telehealth encounter on 06/23/2018 is a billable service, with coverage as determined by her insurance carrier. She is aware that she may receive a bill and has provided verbal consent to proceed: Yes.      Assessment & Plan:   Diagnoses and all orders for this visit:    1. Exanthem-unknown etiology    2. Diaphoresis-possibly linked to #1  ER visit recommended.  Follow up office visit after ED evaluation.    Subjective:   Rodney Arnold is a 33 y.o. adult who was seen for Insect Bite    Established patient for rash evaluation.  Patient woke up with a painful bright red rash above and below his right elbow on the ventral surface. There is no itching.  He is not sure if something bit him.  He is not feeling well and feels clammy.  Home temperature is 99.9 F.  Prior to Admission medications    Medication Sig Start Date End Date Taking? Authorizing Provider   FLUoxetine (PROZAC) 20 mg capsule Take 20 mg by mouth daily.   Yes Provider, Historical   estradiol (ESTRACE) 2 mg tablet Take 2 mg by mouth daily.   Yes Provider, Historical   spironolactone (ALDACTONE) 25 mg tablet Take 100 mg by mouth two (2) times a day.   Yes Provider, Historical   clonazePAM (KLONOPIN) 1 mg tablet Take 1 mg by mouth every twelve (12) hours.   Yes Provider, Historical   citalopram (CELEXA) 20 mg tablet Take 20 mg by mouth nightly.   Yes Provider, Historical   albuterol (PROVENTIL HFA, VENTOLIN HFA, PROAIR HFA) 90 mcg/actuation inhaler Take 2 Puffs by inhalation every four (4) hours as needed for Wheezing. 02/14/18   Mallie Snooks, MD   lactulose (CHRONULAC) 10 gram/15 mL solution 3 tsp every day initially;  may increase to 3 tsp every 8 hr prn constipation 02/14/18   Mallie Snooks, MD   linaCLOtide [facility]) 145 mcg cap capsule Take 1 Cap by mouth Daily (before breakfast). Use for constipation 01/17/18   Mallie Snooks, MD   acetaminophen (TYLENOL) 500 mg tablet Take 2 Tabs by mouth every eight (8) hours as needed for Pain. Indications: Pain 08/17/17   Harrington, Camillia Herter, NP   doxycycline (MONODOX) 100 mg capsule Take 100 mg by mouth two (2) times a day.    Provider, Historical     Allergies   Allergen Reactions   ??? Codeine Anaphylaxis   ??? Penicillins Anaphylaxis   ??? Toradol [Ketorolac] Anaphylaxis   ??? Grape Swelling   ??? Peanut Swelling   ??? Pineapple Swelling   ??? Tramadol Rash and Swelling       Patient Active Problem List   Diagnosis Code   ??? 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 Family history of colon cancer in father Z80.0   ??? FH: breast cancer in relative when <74 years old Z42.3   ??? Bipolar disorder in remission (HCC) F31.70   ??? Back pain M54.9   ??? Lower extremity weakness R29.898   ??? Gait abnormality R26.9   ??? Recurrent falls while walking R29.6     Patient Active Problem List    Diagnosis Date Noted   ??? Recurrent falls while walking 04/06/2017   ???  Gait abnormality 03/28/2017   ??? Lower extremity weakness 03/25/2017   ??? Back pain 03/24/2017   ??? Bipolar disorder in remission (HCC) 11/01/2016   ??? 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 Family history of colon cancer in father 10/15/2016   ??? FH: breast cancer in relative when <33 years old 10/15/2016     Current Outpatient Medications   Medication Sig Dispense Refill   ??? FLUoxetine (PROZAC) 20 mg capsule Take 20 mg by mouth daily.     ??? estradiol (ESTRACE) 2 mg tablet Take 2 mg by mouth daily.     ??? spironolactone (ALDACTONE) 25 mg tablet Take 100 mg by mouth two (2) times a day.     ??? clonazePAM (KLONOPIN) 1 mg tablet Take 1 mg by mouth every twelve (12) hours.     ??? citalopram (CELEXA) 20 mg tablet Take 20 mg by mouth nightly.      ??? albuterol (PROVENTIL HFA, VENTOLIN HFA, PROAIR HFA) 90 mcg/actuation inhaler Take 2 Puffs by inhalation every four (4) hours as needed for Wheezing. 1 Inhaler 2   ??? lactulose (CHRONULAC) 10 gram/15 mL solution 3 tsp every day initially; may increase to 3 tsp every 8 hr prn constipation 480 mL 0   ??? linaCLOtide (LINZESS) 145 mcg cap capsule Take 1 Cap by mouth Daily (before breakfast). Use for constipation 30 Cap 0   ??? acetaminophen (TYLENOL) 500 mg tablet Take 2 Tabs by mouth every eight (8) hours as needed for Pain. Indications: Pain 90 Tab 1   ??? doxycycline (MONODOX) 100 mg capsule Take 100 mg by mouth two (2) times a day.       Allergies   Allergen Reactions   ??? Codeine Anaphylaxis   ??? Penicillins Anaphylaxis   ??? Toradol [Ketorolac] Anaphylaxis   ??? Grape Swelling   ??? Peanut Swelling   ??? Pineapple Swelling   ??? Tramadol Rash and Swelling     Past Medical History:   Diagnosis Date   ??? Asthma due to environmental allergies    ??? Bipolar 1 disorder (HCC)    ??? Cat allergies    ??? Cerebral palsy (HCC)    ??? MS (multiple sclerosis) (HCC)      Past Surgical History:   Procedure Laterality Date   ??? HX ORTHOPAEDIC  1998    quadricep lengthening surgery     Family History   Problem Relation Age of Onset   ??? Psychiatric Disorder Mother    ??? Breast Cancer Mother    ??? Alcohol abuse Mother    ??? Hypertension Mother    ??? Elevated Lipids Mother    ??? Arthritis-rheumatoid Mother    ??? Colon Cancer Father    ??? Alcohol abuse Father    ??? Hypertension Father    ??? Diabetes Father    ??? Heart Failure Father    ??? Psychiatric Disorder Father    ??? Heart Disease Father    ??? Coronary Artery Disease Father    ??? Arthritis-osteo Father    ??? Prostate Cancer Paternal Uncle      Social History     Tobacco Use   ??? Smoking status: Current Every Day Smoker     Packs/day: 1.00     Years: 11.00     Pack years: 11.00   ??? Smokeless tobacco: Never Used   Substance Use Topics   ??? Alcohol use: Yes     Comment: very rarely       Review of  Systems    Constitutional: Positive for diaphoresis and malaise/fatigue.   Skin: Positive for rash.       Objective:   Vital Signs: (As obtained by patient/caregiver at home)  There were no vitals taken for this visit.     [INSTRUCTIONS:  " " Indicates a positive item  " " Indicates a negative item  -- DELETE ALL ITEMS NOT EXAMINED]    Constitutional:  Appears well-developed and well-nourished  No apparent distress       Abnormal -patient is diaphoretic  Mental status:  Alert and awake   Oriented to person/place/time  Able to follow commands     Abnormal -     Eyes:   EOM      Normal     Abnormal -   Sclera    Normal     Abnormal -          Discharge   None visible    Abnormal -     HENT:  Normocephalic, atraumatic   Abnormal -    Mouth/Throat: Mucous membranes are moist    External Ears  Normal   Abnormal -    Neck:  No visualized mass  Abnormal -     Pulmonary/Chest:  Respiratory effort normal    No visualized signs of difficulty breathing or respiratory distress         Abnormal -      Musculoskeletal:    Normal gait with no signs of ataxia          Normal range of motion of neck         Abnormal -     Neurological:         No Facial Asymmetry (Cranial nerve 7 motor function) (limited exam due to video visit)           No gaze palsy         Abnormal -          Skin:         No significant exanthematous lesions or discoloration noted on facial skin          Abnormal - large bright red exanthem noted on the ventral surface of right elbow region          Psychiatric:        Normal Affect  Abnormal -         No Hallucinations    Other pertinent observable physical exam findings:-        We discussed the expected course, resolution and complications of the diagnosis(es) in detail.  Medication risks, benefits, costs, interactions, and alternatives were discussed as indicated.  I advised her to contact  the office if her condition worsens, changes or fails to improve as anticipated. She expressed understanding with the diagnosis(es) and plan.       Rodney Arnold is a 33 y.o. adult who was evaluated by a video visit encounter for concerns as above. Patient identification was verified prior to start of the visit. A caregiver was present when appropriate. Due to this being a Scientist, research (medical) (During COVID-19 public health emergency), evaluation of the following organ systems was limited: Vitals/Constitutional/EENT/Resp/CV/GI/GU/MS/Neuro/Skin/Heme-Lymph-Imm.  Pursuant to the emergency declaration under the [facility] Act and the [employer], 1135 waiver authority and the [employer] Act, this Virtual  Visit was conducted, with patient's (and/or legal guardian's) consent, to reduce the patient's risk of exposure to COVID-19 and provide necessary medical care.  Services were provided through a video synchronous discussion virtually to substitute for in-person clinic visit.   Patient and provider were located at their individual homes.      Mallie Snooks, MD

## 2018-06-23 NOTE — Progress Notes (Signed)
1. Have you been to the ER, urgent care clinic since your last visit?  Hospitalized since your last visit?No    2. Have you seen or consulted any other health care providers outside of the [facility] since your last visit?  Include any pap smears or colon screening. No    Pt suspects bite bite

## 2018-06-23 NOTE — Progress Notes (Signed)
Rodney Arnold is a 33 y.o. adult who was seen by synchronous (real-time) audio-video technology on 06/23/2018.      Consent: Rodney Arnold, who was seen by synchronous (real-time) audio-video technology, and/or her healthcare decision maker, is aware that this patient-initiated, Telehealth encounter on 06/23/2018 is a billable service, with coverage as determined by her insurance carrier. She is aware that she may receive a bill and has provided verbal consent to proceed: Yes.      Assessment & Plan:   Diagnoses and all orders for this visit:    1. Exanthem-unknown etiology    2. Diaphoresis-possibly linked to #1  ER visit recommended.  Follow up office visit after ED evaluation.    Subjective:   Rodney Arnold is a 33 y.o. adult who was seen for Insect Bite    Established patient for rash evaluation.  Patient woke up with a painful bright red rash above and below his right elbow on the ventral surface. There is no itching.  He is not sure if something bit him.  He is not feeling well and feels clammy.  Home temperature is 99.9 F.  Prior to Admission medications    Medication Sig Start Date End Date Taking? Authorizing Provider   FLUoxetine (PROZAC) 20 mg capsule Take 20 mg by mouth daily.   Yes Provider, Historical   estradiol (ESTRACE) 2 mg tablet Take 2 mg by mouth daily.   Yes Provider, Historical   spironolactone (ALDACTONE) 25 mg tablet Take 100 mg by mouth two (2) times a day.   Yes Provider, Historical   clonazePAM (KLONOPIN) 1 mg tablet Take 1 mg by mouth every twelve (12) hours.   Yes Provider, Historical   citalopram (CELEXA) 20 mg tablet Take 20 mg by mouth nightly.   Yes Provider, Historical   albuterol (PROVENTIL HFA, VENTOLIN HFA, PROAIR HFA) 90 mcg/actuation inhaler Take 2 Puffs by inhalation every four (4) hours as needed for Wheezing. 02/14/18   Mallie SnooksBelle, Rahshawn Remo, MD   lactulose (CHRONULAC) 10 gram/15 mL solution 3 tsp every day initially; may increase to 3 tsp every 8 hr prn constipation 02/14/18   Mallie SnooksBelle,  Overton Boggus, MD   linaCLOtide [facility](LINZESS) 145 mcg cap capsule Take 1 Cap by mouth Daily (before breakfast). Use for constipation 01/17/18   Mallie SnooksBelle, Ciela Mahajan, MD   acetaminophen (TYLENOL) 500 mg tablet Take 2 Tabs by mouth every eight (8) hours as needed for Pain. Indications: Pain 08/17/17   Harrington, Camillia HerterPamela G, NP   doxycycline (MONODOX) 100 mg capsule Take 100 mg by mouth two (2) times a day.    Provider, Historical     Allergies   Allergen Reactions   ??? Codeine Anaphylaxis   ??? Penicillins Anaphylaxis   ??? Toradol [Ketorolac] Anaphylaxis   ??? Grape Swelling   ??? Peanut Swelling   ??? Pineapple Swelling   ??? Tramadol Rash and Swelling       Patient Active Problem List   Diagnosis Code   ??? 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 Family history of colon cancer in father Z80.0   ??? FH: breast cancer in relative when <33 years old 80Z80.3   ??? Bipolar disorder in remission (HCC) F31.70   ??? Back pain M54.9   ??? Lower extremity weakness R29.898   ??? Gait abnormality R26.9   ??? Recurrent falls while walking R29.6     Patient Active Problem List    Diagnosis Date Noted   ??? Recurrent falls while walking 04/06/2017   ???  Gait abnormality 03/28/2017   ??? Lower extremity weakness 03/25/2017   ??? Back pain 03/24/2017   ??? Bipolar disorder in remission (HCC) 11/01/2016   ??? 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 Family history of colon cancer in father 10/15/2016   ??? FH: breast cancer in relative when <326 years old 10/15/2016     Current Outpatient Medications   Medication Sig Dispense Refill   ??? FLUoxetine (PROZAC) 20 mg capsule Take 20 mg by mouth daily.     ??? estradiol (ESTRACE) 2 mg tablet Take 2 mg by mouth daily.     ??? spironolactone (ALDACTONE) 25 mg tablet Take 100 mg by mouth two (2) times a day.     ??? clonazePAM (KLONOPIN) 1 mg tablet Take 1 mg by mouth every twelve (12) hours.     ??? citalopram (CELEXA) 20 mg tablet Take 20 mg by mouth nightly.     ??? albuterol (PROVENTIL HFA, VENTOLIN HFA, PROAIR HFA) 90 mcg/actuation inhaler  Take 2 Puffs by inhalation every four (4) hours as needed for Wheezing. 1 Inhaler 2   ??? lactulose (CHRONULAC) 10 gram/15 mL solution 3 tsp every day initially; may increase to 3 tsp every 8 hr prn constipation 480 mL 0   ??? linaCLOtide (LINZESS) 145 mcg cap capsule Take 1 Cap by mouth Daily (before breakfast). Use for constipation 30 Cap 0   ??? acetaminophen (TYLENOL) 500 mg tablet Take 2 Tabs by mouth every eight (8) hours as needed for Pain. Indications: Pain 90 Tab 1   ??? doxycycline (MONODOX) 100 mg capsule Take 100 mg by mouth two (2) times a day.       Allergies   Allergen Reactions   ??? Codeine Anaphylaxis   ??? Penicillins Anaphylaxis   ??? Toradol [Ketorolac] Anaphylaxis   ??? Grape Swelling   ??? Peanut Swelling   ??? Pineapple Swelling   ??? Tramadol Rash and Swelling     Past Medical History:   Diagnosis Date   ??? Asthma due to environmental allergies    ??? Bipolar 1 disorder (HCC)    ??? Cat allergies    ??? Cerebral palsy (HCC)    ??? MS (multiple sclerosis) (HCC)      Past Surgical History:   Procedure Laterality Date   ??? HX ORTHOPAEDIC  1998    quadricep lengthening surgery     Family History   Problem Relation Age of Onset   ??? Psychiatric Disorder Mother    ??? Breast Cancer Mother    ??? Alcohol abuse Mother    ??? Hypertension Mother    ??? Elevated Lipids Mother    ??? Arthritis-rheumatoid Mother    ??? Colon Cancer Father    ??? Alcohol abuse Father    ??? Hypertension Father    ??? Diabetes Father    ??? Heart Failure Father    ??? Psychiatric Disorder Father    ??? Heart Disease Father    ??? Coronary Artery Disease Father    ??? Arthritis-osteo Father    ??? Prostate Cancer Paternal Uncle      Social History     Tobacco Use   ??? Smoking status: Current Every Day Smoker     Packs/day: 1.00     Years: 11.00     Pack years: 11.00   ??? Smokeless tobacco: Never Used   Substance Use Topics   ??? Alcohol use: Yes     Comment: very rarely       Review of  Systems   Constitutional: Positive for diaphoresis and malaise/fatigue.   Skin: Positive for rash.        Objective:   Vital Signs: (As obtained by patient/caregiver at home)  There were no vitals taken for this visit.     [INSTRUCTIONS:  "[x] " Indicates a positive item  "[] " Indicates a negative item  -- DELETE ALL ITEMS NOT EXAMINED]    Constitutional: [x]  Appears well-developed and well-nourished []  No apparent distress      [x]  Abnormal -patient is diaphoretic  Mental status: [x]  Alert and awake  [x]  Oriented to person/place/time [x]  Able to follow commands    []  Abnormal -     Eyes:   EOM    [x]   Normal    []  Abnormal -   Sclera  [x]   Normal    []  Abnormal -          Discharge [x]   None visible   []  Abnormal -     HENT: [x]  Normocephalic, atraumatic  []  Abnormal -   [x]  Mouth/Throat: Mucous membranes are moist    External Ears [x]  Normal  []  Abnormal -    Neck: [x]  No visualized mass []  Abnormal -     Pulmonary/Chest: [x]  Respiratory effort normal   [x]  No visualized signs of difficulty breathing or respiratory distress        []  Abnormal -      Musculoskeletal:   [x]  Normal gait with no signs of ataxia         [x]  Normal range of motion of neck        []  Abnormal -     Neurological:        [x]  No Facial Asymmetry (Cranial nerve 7 motor function) (limited exam due to video visit)          [x]  No gaze palsy        []  Abnormal -          Skin:        []  No significant exanthematous lesions or discoloration noted on facial skin         []  Abnormal - large bright red exanthem noted on the ventral surface of right elbow region          Psychiatric:       [x]  Normal Affect []  Abnormal -        [x]  No Hallucinations    Other pertinent observable physical exam findings:-        We discussed the expected course, resolution and complications of the diagnosis(es) in detail.  Medication risks, benefits, costs, interactions, and alternatives were discussed as indicated.  I advised her to contact the office if her condition worsens, changes or fails to improve as anticipated. She expressed understanding with the  diagnosis(es) and plan.       Rodney Arnold is a 33 y.o. adult who was evaluated by a video visit encounter for concerns as above. Patient identification was verified prior to start of the visit. A caregiver was present when appropriate. Due to this being a Scientist, physiological (During FAOZH-08 public health emergency), evaluation of the following organ systems was limited: Vitals/Constitutional/EENT/Resp/CV/GI/GU/MS/Neuro/Skin/Heme-Lymph-Imm.  Pursuant to the emergency declaration under the [place], 1135 waiver authority and the [employer] Act, this Virtual  Visit was conducted, with patient's (and/or legal guardian's) consent, to reduce the patient's risk of exposure to COVID-19 and provide necessary medical care.  Services were provided through a video synchronous discussion virtually to substitute for in-person clinic visit.   Patient and provider were located at their individual homes.      Mallie Snooks, MD

## 2021-06-07 ENCOUNTER — Emergency Department
Admission: EM | Admit: 2021-06-07 | Discharge: 2021-06-07 | Disposition: A | Discharge status: Home / Self Care | Payer: Medicare Other | Attending: Emergency Medicine | Admitting: Emergency Medicine

## 2021-06-07 ENCOUNTER — Ambulatory Visit
Admission: EM | Admit: 2021-06-07 | Discharge: 2021-06-07 | Disposition: A | Discharge status: Home / Self Care | Payer: No Typology Code available for payment source | Source: Ambulatory Visit | Attending: Emergency Medicine | Admitting: Emergency Medicine

## 2021-06-07 ENCOUNTER — Other Ambulatory Visit: Payer: Self-pay

## 2021-06-07 DIAGNOSIS — T7421XA Adult sexual abuse, confirmed, initial encounter: Secondary | ICD-10-CM

## 2021-06-07 DIAGNOSIS — X58XXXA Exposure to other specified factors, initial encounter: Secondary | ICD-10-CM | POA: Diagnosis not present

## 2021-06-07 DIAGNOSIS — Z0441 Encounter for examination and observation following alleged adult rape: Secondary | ICD-10-CM | POA: Diagnosis present

## 2021-06-07 DIAGNOSIS — S50312A Abrasion of left elbow, initial encounter: Secondary | ICD-10-CM | POA: Diagnosis not present

## 2021-06-07 LAB — RAPID HIV SCREEN (HIV 1/2 AB+AG)
HIV 1/2 Antibodies: NONREACTIVE
HIV-1 P24 Antigen - HIV24: NONREACTIVE

## 2021-06-07 LAB — COMPREHENSIVE METABOLIC PANEL
ALT: 18 U/L (ref 0–44)
AST: 25 U/L (ref 15–41)
Albumin: 4.2 g/dL (ref 3.5–5.0)
Alkaline Phosphatase: 90 U/L (ref 38–126)
Anion gap: 7 (ref 5–15)
BUN: 13 mg/dL (ref 6–20)
CO2: 28 mmol/L (ref 22–32)
Calcium: 9.4 mg/dL (ref 8.9–10.3)
Chloride: 104 mmol/L (ref 98–111)
Creatinine, Ser: 0.74 mg/dL (ref 0.61–1.24)
GFR, Estimated: >60 mL/min (ref >60)
Glucose, Bld: 93 mg/dL (ref 70–99)
Potassium: 3.7 mmol/L (ref 3.5–5.1)
Sodium: 139 mmol/L (ref 135–145)
Total Bilirubin: 0.7 mg/dL (ref 0.3–1.2)
Total Protein: 7.7 g/dL (ref 6.5–8.1)

## 2021-06-07 MED ORDER — AZITHROMYCIN 500 MG PO TABS
1000.0000 mg | ORAL_TABLET | Freq: Once | ORAL | Status: AC
  Administered 2021-06-07: 1000 mg via ORAL
  Filled 2021-06-07: qty 2

## 2021-06-07 MED ORDER — CEFTRIAXONE SODIUM 1 G IJ SOLR
500.0000 mg | Freq: Once | INTRAMUSCULAR | Status: DC
  Filled 2021-06-07: qty 10

## 2021-06-07 MED ORDER — ELVITEG-COBIC-EMTRICIT-TENOFAF 150-150-200-10 MG PREPACK
1.0000 | ORAL_TABLET | Freq: Once | ORAL | Status: AC
  Administered 2021-06-07: 1 via ORAL
  Filled 2021-06-07 (×2): qty 1

## 2021-06-07 MED ORDER — ELVITEG-COBIC-EMTRICIT-TENOFAF 150-150-200-10 MG PREPACK
ORAL_TABLET | ORAL | Status: AC
  Filled 2021-06-07: qty 1

## 2021-06-07 MED ORDER — ELVITEG-COBIC-EMTRICIT-TENOFAF 150-150-200-10 MG PO TABS
1.0000 | ORAL_TABLET | Freq: Every day | ORAL | 0 refills | Status: AC
  Filled 2021-06-08: qty 30, 30d supply, fill #0

## 2021-06-07 MED ORDER — LIDOCAINE HCL (PF) 1 % IJ SOLN
1.0000 mL | Freq: Once | INTRAMUSCULAR | Status: DC
  Filled 2021-06-07: qty 5
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
## 2021-06-08 ENCOUNTER — Other Ambulatory Visit (HOSPITAL_COMMUNITY): Payer: Self-pay

## 2021-06-08 LAB — HEPATITIS B SURFACE ANTIGEN: Hepatitis B Surface Ag: NONREACTIVE

## 2021-06-08 LAB — RPR: RPR Ser Ql: NONREACTIVE

## 2021-06-08 LAB — HEPATITIS C ANTIBODY: HCV Ab: REACTIVE — AB

## 2021-06-11 ENCOUNTER — Telehealth: Payer: Self-pay | Admitting: Emergency Medicine

## 2021-06-11 NOTE — Telephone Encounter (Addendum)
Called patient to inform about testing for hepatitis and hiv has resulted.  Explained negative hiv.  Reactive hep c with further testing being done.  I asked if she has every been told she had hep c.  She says she has been told that.  I asked if she knew that there was treatment available for hep c.  She says she is aware.

## 2022-01-29 ENCOUNTER — Other Ambulatory Visit (HOSPITAL_COMMUNITY): Payer: Self-pay
